# Patient Record
Sex: Male | Born: 1955 | Race: White | Hispanic: No | Marital: Married | Smoking: Never smoker
Health system: Southern US, Community
[De-identification: ages and names within clinical notes are randomized; demographics above are authoritative.]

## PROBLEM LIST (undated history)

## (undated) DIAGNOSIS — K7460 Unspecified cirrhosis of liver: Secondary | ICD-10-CM

## (undated) DIAGNOSIS — R160 Hepatomegaly, not elsewhere classified: Secondary | ICD-10-CM

## (undated) DIAGNOSIS — E871 Hypo-osmolality and hyponatremia: Secondary | ICD-10-CM

---

## 2006-09-14 ENCOUNTER — Ambulatory Visit: Payer: Self-pay | Admitting: Family Medicine

## 2015-09-14 ENCOUNTER — Inpatient Hospital Stay (HOSPITAL_COMMUNITY)
Admission: EM | Admit: 2015-09-14 | Discharge: 2015-09-23 | DRG: 435 | Disposition: A | Discharge status: Home / Self Care | Payer: Medicaid Other | Attending: Family Medicine | Admitting: Family Medicine

## 2015-09-14 ENCOUNTER — Emergency Department (HOSPITAL_COMMUNITY): Payer: Medicaid Other

## 2015-09-14 ENCOUNTER — Encounter (HOSPITAL_COMMUNITY): Payer: Self-pay | Admitting: Emergency Medicine

## 2015-09-14 DIAGNOSIS — C221 Intrahepatic bile duct carcinoma: Secondary | ICD-10-CM | POA: Diagnosis present

## 2015-09-14 DIAGNOSIS — D689 Coagulation defect, unspecified: Secondary | ICD-10-CM | POA: Diagnosis present

## 2015-09-14 DIAGNOSIS — K831 Obstruction of bile duct: Secondary | ICD-10-CM | POA: Diagnosis present

## 2015-09-14 DIAGNOSIS — E871 Hypo-osmolality and hyponatremia: Secondary | ICD-10-CM | POA: Diagnosis present

## 2015-09-14 DIAGNOSIS — J90 Pleural effusion, not elsewhere classified: Secondary | ICD-10-CM | POA: Diagnosis present

## 2015-09-14 DIAGNOSIS — R188 Other ascites: Secondary | ICD-10-CM | POA: Diagnosis present

## 2015-09-14 DIAGNOSIS — R6881 Early satiety: Secondary | ICD-10-CM | POA: Diagnosis present

## 2015-09-14 DIAGNOSIS — N390 Urinary tract infection, site not specified: Secondary | ICD-10-CM | POA: Diagnosis present

## 2015-09-14 DIAGNOSIS — D6959 Other secondary thrombocytopenia: Secondary | ICD-10-CM | POA: Diagnosis present

## 2015-09-14 DIAGNOSIS — I8510 Secondary esophageal varices without bleeding: Secondary | ICD-10-CM | POA: Diagnosis present

## 2015-09-14 DIAGNOSIS — R160 Hepatomegaly, not elsewhere classified: Secondary | ICD-10-CM | POA: Diagnosis present

## 2015-09-14 DIAGNOSIS — K7469 Other cirrhosis of liver: Secondary | ICD-10-CM | POA: Diagnosis present

## 2015-09-14 DIAGNOSIS — C801 Malignant (primary) neoplasm, unspecified: Secondary | ICD-10-CM | POA: Insufficient documentation

## 2015-09-14 DIAGNOSIS — K7460 Unspecified cirrhosis of liver: Secondary | ICD-10-CM | POA: Insufficient documentation

## 2015-09-14 DIAGNOSIS — R140 Abdominal distension (gaseous): Secondary | ICD-10-CM

## 2015-09-14 DIAGNOSIS — R591 Generalized enlarged lymph nodes: Secondary | ICD-10-CM | POA: Diagnosis present

## 2015-09-14 DIAGNOSIS — D72829 Elevated white blood cell count, unspecified: Secondary | ICD-10-CM | POA: Diagnosis present

## 2015-09-14 LAB — URINALYSIS, ROUTINE W REFLEX MICROSCOPIC
Glucose, UA: NEGATIVE mg/dL
Ketones, ur: 15 mg/dL — AB
NITRITE: POSITIVE — AB
PROTEIN: NEGATIVE mg/dL
SPECIFIC GRAVITY, URINE: 1.046 — AB (ref 1.005–1.030)
pH: 5.5 (ref 5.0–8.0)

## 2015-09-14 LAB — LIPASE, BLOOD: LIPASE: 31 U/L (ref 11–51)

## 2015-09-14 LAB — COMPREHENSIVE METABOLIC PANEL
ALK PHOS: 221 U/L — AB (ref 38–126)
ALT: 48 U/L (ref 17–63)
ANION GAP: 6 (ref 5–15)
AST: 103 U/L — ABNORMAL HIGH (ref 15–41)
Albumin: 2.3 g/dL — ABNORMAL LOW (ref 3.5–5.0)
BILIRUBIN TOTAL: 4 mg/dL — AB (ref 0.3–1.2)
BUN: 12 mg/dL (ref 6–20)
CALCIUM: 8.7 mg/dL — AB (ref 8.9–10.3)
CO2: 28 mmol/L (ref 22–32)
CREATININE: 0.69 mg/dL (ref 0.61–1.24)
Chloride: 102 mmol/L (ref 101–111)
Glucose, Bld: 98 mg/dL (ref 65–99)
Potassium: 4.2 mmol/L (ref 3.5–5.1)
SODIUM: 136 mmol/L (ref 135–145)
TOTAL PROTEIN: 7.4 g/dL (ref 6.5–8.1)

## 2015-09-14 LAB — URINE MICROSCOPIC-ADD ON

## 2015-09-14 LAB — CBC
HCT: 38.9 % — ABNORMAL LOW (ref 39.0–52.0)
HEMOGLOBIN: 13.6 g/dL (ref 13.0–17.0)
MCH: 36.3 pg — AB (ref 26.0–34.0)
MCHC: 35 g/dL (ref 30.0–36.0)
MCV: 103.7 fL — ABNORMAL HIGH (ref 78.0–100.0)
PLATELETS: 128 10*3/uL — AB (ref 150–400)
RBC: 3.75 MIL/uL — AB (ref 4.22–5.81)
RDW: 14.4 % (ref 11.5–15.5)
WBC: 6.7 10*3/uL (ref 4.0–10.5)

## 2015-09-14 MED ORDER — SODIUM CHLORIDE 0.9 % IJ SOLN
3.0000 mL | Freq: Two times a day (BID) | INTRAMUSCULAR | Status: DC
  Administered 2015-09-15 – 2015-09-17 (×4): 3 mL via INTRAVENOUS

## 2015-09-14 MED ORDER — SODIUM CHLORIDE 0.9 % IJ SOLN: 3.0000 mL | Freq: Two times a day (BID) | INTRAMUSCULAR | Status: DC

## 2015-09-14 MED ORDER — DEXTROSE 5 % IV SOLN
1.0000 g | Freq: Once | INTRAVENOUS | Status: AC
  Administered 2015-09-14: 1 g via INTRAVENOUS
  Filled 2015-09-14: qty 10

## 2015-09-14 MED ORDER — SODIUM CHLORIDE 0.9 % IJ SOLN
3.0000 mL | INTRAMUSCULAR | Status: DC | PRN
Start: 2015-09-14 — End: 2015-09-17

## 2015-09-14 MED ORDER — ONDANSETRON HCL 4 MG/2ML IJ SOLN
4.0000 mg | Freq: Four times a day (QID) | INTRAMUSCULAR | Status: DC | PRN
Start: 2015-09-14 — End: 2015-09-23

## 2015-09-14 MED ORDER — ONDANSETRON HCL 4 MG PO TABS: 4.0000 mg | ORAL_TABLET | Freq: Four times a day (QID) | ORAL | Status: DC | PRN

## 2015-09-14 MED ORDER — DEXTROSE 5 % IV SOLN
1.0000 g | INTRAVENOUS | Status: DC
  Administered 2015-09-15 – 2015-09-19 (×5): 1 g via INTRAVENOUS
  Filled 2015-09-14 (×5): qty 10

## 2015-09-14 MED ORDER — LORAZEPAM 2 MG/ML IJ SOLN: 1.0000 mg | Freq: Every evening | INTRAMUSCULAR | Status: DC | PRN

## 2015-09-14 MED ORDER — SODIUM CHLORIDE 0.9 % IV SOLN: 250.0000 mL | INTRAVENOUS | Status: DC | PRN

## 2015-09-14 MED ORDER — IOHEXOL 300 MG/ML  SOLN
100.0000 mL | Freq: Once | INTRAMUSCULAR | Status: AC | PRN
  Administered 2015-09-14: 100 mL via INTRAVENOUS

## 2015-09-14 NOTE — ED Notes (Signed)
Attempted to call report. RN unable to take report at this time. Will call back.  

## 2015-09-14 NOTE — ED Provider Notes (Signed)
CSN: [id number]     Arrival date & time 09/14/15  1535 History   First MD Initiated Contact with Patient 09/14/15 1708     Chief Complaint  Patient presents with  . Bloated  . Back Pain     (Consider location/radiation/quality/duration/timing/severity/associated sxs/prior Treatment) HPI Comments: [name] with no significant past medical history presents for bloating and back pain.  The patient reports that he has lost about 35-40 lbs over the last 1 year.  He says that recently he has developed a distended abdomen and has developed leg swelling.  He denies shortness of breath but feels that his abdomen is so large that it is pushing up on his lungs.  He also reports that he has noted associated bilateral flank pain.  He feels like his stomach is full all the time but he is hungry and then cannot eat because his abdomen feels too full.    Patient is a 59 y.o. male presenting with back pain.  Back Pain Associated symptoms: abdominal pain (discomfort without acute pain or focal pain)   Associated symptoms: no chest pain, no dysuria, no fever, no headaches and no weakness     History reviewed. No pertinent past medical history. History reviewed. No pertinent past surgical history. No family history on file. Social History  Substance Use Topics  . Smoking status: Never Smoker   . Smokeless tobacco: None  . Alcohol Use: Yes    Review of Systems  Constitutional: Negative for fever, chills and fatigue.  HENT: Negative for congestion, postnasal drip, rhinorrhea and sinus pressure.   Eyes: Negative for pain.  Respiratory: Negative for cough, chest tightness and shortness of breath.   Cardiovascular: Negative for chest pain and palpitations.  Gastrointestinal: Positive for abdominal pain (discomfort without acute pain or focal pain). Negative for nausea, vomiting, diarrhea and constipation.  Genitourinary: Positive for flank pain (bilateral). Negative for dysuria, urgency and hematuria.   Musculoskeletal: Positive for back pain. Negative for myalgias.  Skin: Negative for rash.  Neurological: Negative for dizziness, weakness and headaches.  Hematological: Does not bruise/bleed easily.      Allergies  Review of patient's allergies indicates no known allergies.  Home Medications   Prior to Admission medications   Medication Sig Start Date End Date Taking? Authorizing Provider  Acetaminophen (TYLENOL PO) Take 2 tablets by mouth every 4 (four) hours as needed (pain).   Yes Historical Provider, MD   BP 142/95 mmHg  Pulse 60  Temp(Src) 98 F (36.7 C) (Oral)  Resp 18  SpO2 97% Physical Exam  Constitutional: He is oriented to person, place, and time. He appears well-developed and well-nourished. No distress.  HENT:  Head: Normocephalic and atraumatic.  Right Ear: External ear normal.  Left Ear: External ear normal.  Mouth/Throat: Oropharynx is clear and moist. No oropharyngeal exudate.  Eyes: EOM are normal. Pupils are equal, round, and reactive to light.  Neck: Normal range of motion. Neck supple.  Cardiovascular: Normal rate, regular rhythm, normal heart sounds and intact distal pulses.   No murmur heard. Pulmonary/Chest: Effort normal. No respiratory distress. He has no wheezes. He has no rales.  Abdominal: Soft. He exhibits distension. There is no tenderness. There is no rebound.  Musculoskeletal: He exhibits edema (1+ bilateral lower extremity edema, symmetric and at the ankles and below).  Neurological: He is alert and oriented to person, place, and time.  Skin: Skin is warm and dry. No rash noted. He is not diaphoretic.  Vitals reviewed.  ED Course  Procedures (including critical care time) Labs Review Labs Reviewed  COMPREHENSIVE METABOLIC PANEL - Abnormal; Notable for the following:    Calcium 8.7 (*)    Albumin 2.3 (*)    AST 103 (*)    Alkaline Phosphatase 221 (*)    Total Bilirubin 4.0 (*)    All other components within normal limits  CBC -  Abnormal; Notable for the following:    RBC 3.75 (*)    HCT 38.9 (*)    MCV 103.7 (*)    MCH 36.3 (*)    Platelets 128 (*)    All other components within normal limits  URINALYSIS, ROUTINE W REFLEX MICROSCOPIC (NOT AT [facility]) - Abnormal; Notable for the following:    Color, Urine ORANGE (*)    Specific Gravity, Urine 1.046 (*)    Hgb urine dipstick SMALL (*)    Bilirubin Urine MODERATE (*)    Ketones, ur 15 (*)    Nitrite POSITIVE (*)    Leukocytes, UA SMALL (*)    All other components within normal limits  URINE MICROSCOPIC-ADD ON - Abnormal; Notable for the following:    Squamous Epithelial / LPF 0-5 (*)    Bacteria, UA FEW (*)    Crystals CA OXALATE CRYSTALS (*)    All other components within normal limits  LIPASE, BLOOD    Imaging Review Dg Chest 2 View  09/14/2015  CLINICAL DATA:  Bilateral ankle swelling. Abdominal distention. Back pain. Shallow breathing. EXAM: CHEST  2 VIEW COMPARISON:  None. FINDINGS: Poor inspiration. Normal sized heart. Minimal left lateral pleural thickening. No pleural fluid is seen posteriorly on either side. Small amount of linear density at the left lung base. Mild thoracic spine degenerative changes. IMPRESSION: Poor inspiration with a small amount of pleural and parenchymal scarring at the left lung base. Electronically Signed   By: Claudie Revering M.D.   On: 09/14/2015 19:20   Ct Abdomen Pelvis W Contrast  09/14/2015  CLINICAL DATA:  Abdominal distention. Back pain. Lower extremity swelling. EXAM: CT ABDOMEN AND PELVIS WITH CONTRAST TECHNIQUE: Multidetector CT imaging of the abdomen and pelvis was performed using the standard protocol following bolus administration of intravenous contrast. CONTRAST:  119mL OMNIPAQUE IOHEXOL 300 MG/ML  SOLN COMPARISON:  None. FINDINGS: Lower chest: No significant pulmonary nodules or acute consolidative airspace disease. Subsegmental atelectasis in the basilar lower lobes. Hepatobiliary: The liver surface is diffusely  prominently irregular, and there is relative hypertrophy of the left liver lobe, in keeping with cirrhosis. There is severe diffuse intrahepatic biliary ductal dilatation. There is a lobulated 4.8 x 3.5 cm mass in the porta hepatis (series 2/ image 25), which demonstrates hypoenhancement relative to the liver parenchyma, and which demonstrates finger-like extensions into the right liver lobe along the biliary tree. 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 Simple appearing 1.9 cm liver cyst in the posterior right liver lobe. Separate simple appearing 3.4 cm liver cyst in the posterior upper right liver lobe. There are at least 2 additional subcentimeter hypodense lesions in the left liver lobe, too small to characterize. Nondistended gallbladder with nonspecific mild diffuse gallbladder wall thickening. The common bile duct is dilated up to 14 mm in diameter proximally with smooth distal tapering. Pancreas: Normal, with no mass or duct dilation. Spleen: Normal size. No mass. Adrenals/Urinary Tract: Normal adrenals. Normal kidneys with no hydronephrosis and no renal mass. Normal bladder. Stomach/Bowel: Grossly normal stomach. Normal caliber small bowel with no small bowel wall thickening.  Normal appendix. Normal large bowel with no diverticulosis, large bowel wall thickening or pericolonic fat stranding. Vascular/Lymphatic: Normal caliber abdominal aorta. Patent portal, splenic and renal veins. There are large esophageal varices. There are small paraumbilical varices. There are additional portosystemic varices in the left omentum, which appear to drain into the left pelvic deep veins. There are multiple mildly enlarged porta hepatis nodes, largest 1.4 cm (series 2/ image 30). Reproductive: Normal size prostate with nonspecific internal prostatic calcifications. Other: No pneumoperitoneum. Moderate to large volume ascites, which measures simple fluid density. Nonspecific fat  stranding throughout the greater omentum and mesentery. Musculoskeletal: No aggressive appearing focal osseous lesions. Subacute healing lateral left eighth rib fracture. Mild-to-moderate degenerative changes in the visualized thoracolumbar spine. IMPRESSION: 1. Cirrhosis. Lobulated 4.8 x 3.5 cm solid mass in the porta hepatis with finger-like extensions into the right liver lobe along the biliary tree. Severe diffuse intrahepatic biliary ductal dilatation. 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 MRI abdomen with and without intravenous contrast could be performed after paracentesis for further evaluation. Recommend GI consultation for ERCP and possible biliary decompression, which may require IR consultation for percutaneous biliary decompression. 2. Large volume ascites. 3. Large esophageal varices. Small paraumbilical varices. Additional portosystemic left omental varices. 4. Nonspecific mild porta hepatis lymphadenopathy. 5. Separate subcentimeter hypodense liver lesions are too small to characterize and could represent liver metastases. 6. Subacute lateral left eighth rib fracture. These results were called by telephone at the time of interpretation on 09/14/2015 at 7:38 pm to Dr. Lonia Skinner , who verbally acknowledged these results. Electronically Signed   By: Ilona Sorrel M.D.   On: 09/14/2015 19:40   I have personally reviewed and evaluated these images and lab results as part of my medical decision-making.   EKG Interpretation None      MDM  Patient was seen and evaluated in stable condition.  CT with concern for cholangiocarcinoma and with biliary dilatation.  Labs showed mildly elevated bilirubin, AST, alk phos.  UA consistent with infection and patient started on Rocephin.  Discussed case with physician on call for [facility] who said they would see the patient in consultation after  admission and evaluated for possible intervention at that time.  Discussed with Dr. Olevia Bowens who agreed with admission and patient was admitted under his care. Final diagnoses:  Ascites  Abdominal distension    1. Biliary ddilatation  2. Cholangiocarcinoma, suspected    Harvel Quale, MD 09/14/15 2046

## 2015-09-14 NOTE — ED Notes (Signed)
MD at bedside. Hospitalist at bedside. 

## 2015-09-14 NOTE — H&P (Signed)
[facility] History and Physical  OMI TAVELLA [id number] DOB: April 23, 1956 DOA: 09/14/2015  Referring physician: Harvel Quale, MD PCP: No primary care provider on file.   Chief Complaint: Bloating.  HPI: CYLAR LAPA is a 59 y.o. male with no significant past medical history who comes to the emergency department with complaints of abdominal distention, abdominal pain, bilateral flank pain, lower extremity edema for about 2 weeks. This was preceded by progressively worse bloating, early satiety for most of this year. He also endorses a 35 pound weight loss during this timeframe as well. He states that he adjusted his eating habits to this, did not think that there was any major health issue on until his abdominal distention and lower extremity edema. He denies chest pain, palpitations, dizziness, diaphoresis, PND, but complains of dyspnea and orthopnea (he feels his "abdomen is pushing on his lungs"). He denies fever, chills, but feels fatigued.   Workup in the ER shows CT scan with the hepatic mass and abnormal LFTs with hyperbilirubinemia.  Review of Systems:  Constitutional:  Positive for weight loss, fatigue.  No night sweats, Fevers, chills.  HEENT:  No headaches, Difficulty swallowing,Tooth/dental problems,Sore throat.  No sneezing, itching, ear ache, nasal congestion, post nasal drip,  Cardio-vascular:  As above mentioned  GI:  Positive abdominal pain, nausea, loss of appetite, early satiety.  No vomiting, diarrhea, change in bowel habits,  Resp:  Positive dyspnea, denies productive cough, hemoptysis or wheezing. Skin:  no rash or lesions.  GU:  no dysuria, change in color of urine, no urgency or frequency. No flank pain.  Musculoskeletal:  Positive back pain and flank pain due to ascites. Psych:  Feels worried and anxious about this liver mass.  No change in mood or affect. No known history depression or anxiety. No memory loss.   History reviewed. No  pertinent past medical history. History reviewed. No pertinent past surgical history. Social History:  reports that he has never smoked. He does not have any smokeless tobacco history on file. He reports that he drinks alcohol. He reports that he does not use illicit drugs.  No Known Allergies  No family history on file.   Prior to Admission medications   Medication Sig Start Date End Date Taking? Authorizing Provider  Acetaminophen (TYLENOL PO) Take 2 tablets by mouth every 4 (four) hours as needed (pain).   Yes Historical Provider, MD   Physical Exam: Filed Vitals:   09/14/15 1606 09/14/15 1831 09/14/15 2118  BP: 148/100 142/95 132/89  Pulse: 109 60 102  Temp: 98 F (36.7 C)    TempSrc: Oral    Resp: 20 18 18   SpO2: 100% 97% 100%    Wt Readings from Last 3 Encounters:  No data found for Wt    General:  Appears calm and comfortable Eyes: PERRL, normal lids, irises & conjunctiva ENT: grossly normal hearing, lips & tongue Neck: no LAD, masses or thyromegaly Cardiovascular: RRR, no m/r/g. 1+ LE edema. Telemetry: SR, no arrhythmias  Respiratory: CTA bilaterally, no w/r/r. Normal respiratory effort. Abdomen: Distended, positive ascites, bowel sounds positive, soft, mild epigastric/right upper quadrant tenderness on palpation. No guarding or rebound tenderness. Skin: Multiple ecchymoses. Musculoskeletal: grossly normal tone BUE/BLE Psychiatric: grossly normal mood and affect, speech fluent and appropriate Neurologic: grossly non-focal.          Labs on Admission:  Basic Metabolic Panel:  Recent Labs Lab 09/14/15 1628  NA 136  K 4.2  CL 102  CO2 28  GLUCOSE 98  BUN 12  CREATININE 0.69  CALCIUM 8.7*   Liver Function Tests:  Recent Labs Lab 09/14/15 1628  AST 103*  ALT 48  ALKPHOS 221*  BILITOT 4.0*  PROT 7.4  ALBUMIN 2.3*    Recent Labs Lab 09/14/15 1830  LIPASE 31   CBC:  Recent Labs Lab 09/14/15 1628  WBC 6.7  HGB 13.6  HCT 38.9*  MCV  103.7*  PLT 128*    Radiological Exams on Admission: Dg Chest 2 View  09/14/2015  CLINICAL DATA:  Bilateral ankle swelling. Abdominal distention. Back pain. Shallow breathing. EXAM: CHEST  2 VIEW COMPARISON:  None. FINDINGS: Poor inspiration. Normal sized heart. Minimal left lateral pleural thickening. No pleural fluid is seen posteriorly on either side. Small amount of linear density at the left lung base. Mild thoracic spine degenerative changes. IMPRESSION: Poor inspiration with a small amount of pleural and parenchymal scarring at the left lung base. Electronically Signed   By: Claudie Revering M.D.   On: 09/14/2015 19:20   Ct Abdomen Pelvis W Contrast  09/14/2015  CLINICAL DATA:  Abdominal distention. Back pain. Lower extremity swelling. EXAM: CT ABDOMEN AND PELVIS WITH CONTRAST TECHNIQUE: Multidetector CT imaging of the abdomen and pelvis was performed using the standard protocol following bolus administration of intravenous contrast. CONTRAST:  176mL OMNIPAQUE IOHEXOL 300 MG/ML  SOLN COMPARISON:  None. FINDINGS: Lower chest: No significant pulmonary nodules or acute consolidative airspace disease. Subsegmental atelectasis in the basilar lower lobes. Hepatobiliary: The liver surface is diffusely prominently irregular, and there is relative hypertrophy of the left liver lobe, in keeping with cirrhosis. There is severe diffuse intrahepatic biliary ductal dilatation. There is a lobulated 4.8 x 3.5 cm mass in the porta hepatis (series 2/ image 25), which demonstrates hypoenhancement relative to the liver parenchyma, and which demonstrates finger-like extensions into the right liver lobe along the biliary tree. 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 Simple appearing 1.9 cm liver cyst in the posterior right liver lobe. Separate simple appearing 3.4 cm liver cyst in the posterior upper right liver lobe. There are at least 2 additional subcentimeter hypodense lesions in the  left liver lobe, too small to characterize. Nondistended gallbladder with nonspecific mild diffuse gallbladder wall thickening. The common bile duct is dilated up to 14 mm in diameter proximally with smooth distal tapering. Pancreas: Normal, with no mass or duct dilation. Spleen: Normal size. No mass. Adrenals/Urinary Tract: Normal adrenals. Normal kidneys with no hydronephrosis and no renal mass. Normal bladder. Stomach/Bowel: Grossly normal stomach. Normal caliber small bowel with no small bowel wall thickening. Normal appendix. Normal large bowel with no diverticulosis, large bowel wall thickening or pericolonic fat stranding. Vascular/Lymphatic: Normal caliber abdominal aorta. Patent portal, splenic and renal veins. There are large esophageal varices. There are small paraumbilical varices. There are additional portosystemic varices in the left omentum, which appear to drain into the left pelvic deep veins. There are multiple mildly enlarged porta hepatis nodes, largest 1.4 cm (series 2/ image 30). Reproductive: Normal size prostate with nonspecific internal prostatic calcifications. Other: No pneumoperitoneum. Moderate to large volume ascites, which measures simple fluid density. Nonspecific fat stranding throughout the greater omentum and mesentery. Musculoskeletal: No aggressive appearing focal osseous lesions. Subacute healing lateral left eighth rib fracture. Mild-to-moderate degenerative changes in the visualized thoracolumbar spine. IMPRESSION: 1. Cirrhosis. Lobulated 4.8 x 3.5 cm solid mass in the porta hepatis with finger-like extensions into the right liver lobe along the biliary tree. Severe diffuse intrahepatic  biliary ductal dilatation. 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 MRI abdomen with and without intravenous contrast could be performed after paracentesis for further evaluation. Recommend  GI consultation for ERCP and possible biliary decompression, which may require IR consultation for percutaneous biliary decompression. 2. Large volume ascites. 3. Large esophageal varices. Small paraumbilical varices. Additional portosystemic left omental varices. 4. Nonspecific mild porta hepatis lymphadenopathy. 5. Separate subcentimeter hypodense liver lesions are too small to characterize and could represent liver metastases. 6. Subacute lateral left eighth rib fracture. These results were called by telephone at the time of interpretation on 09/14/2015 at 7:38 pm to Dr. Lonia Skinner , who verbally acknowledged these results. Electronically Signed   By: Ilona Sorrel M.D.   On: 09/14/2015 19:40      Assessment/Plan Principal Problem:   Liver mass Results were explained in detail to the patient.  Analgesics and antiemetics as needed. Dr. Ardis Hughs from [facility] gastroenterology has been consulted.  Active Problems:   Ascites Please consult IR in the morning for therapeutic and diagnostic ultrasound-guided paracentesis.    UTI (lower urinary tract infection) Continue ceftriaxone IV. Follow-up urine culture and sensitivity.   Dr. Ardis Hughs from [facility] gastroenterology has been consulted.   Code Status: Full code. DVT Prophylaxis: SCDs. Family Communication: Disposition Plan: Admit to telemetry, GI to evaluate in the morning.  Time spent: Over 70 minutes were spent during the process of this admission.  Reubin Milan [facility] Pager [phone].

## 2015-09-14 NOTE — ED Notes (Signed)
RN Chere starting IV to get labs

## 2015-09-14 NOTE — ED Notes (Signed)
Per pt, states abdominal distention causing back pain-states B/L ankle swelling-states last physical was 30 years ago

## 2015-09-15 ENCOUNTER — Inpatient Hospital Stay (HOSPITAL_COMMUNITY): Payer: Medicaid Other

## 2015-09-15 ENCOUNTER — Encounter (HOSPITAL_COMMUNITY): Payer: Self-pay | Admitting: *Deleted

## 2015-09-15 DIAGNOSIS — N390 Urinary tract infection, site not specified: Secondary | ICD-10-CM

## 2015-09-15 DIAGNOSIS — R140 Abdominal distension (gaseous): Secondary | ICD-10-CM

## 2015-09-15 LAB — CBC
HEMATOCRIT: 35.5 % — AB (ref 39.0–52.0)
Hemoglobin: 12.6 g/dL — ABNORMAL LOW (ref 13.0–17.0)
MCH: 36.5 pg — ABNORMAL HIGH (ref 26.0–34.0)
MCHC: 35.5 g/dL (ref 30.0–36.0)
MCV: 102.9 fL — AB (ref 78.0–100.0)
Platelets: 106 10*3/uL — ABNORMAL LOW (ref 150–400)
RBC: 3.45 MIL/uL — AB (ref 4.22–5.81)
RDW: 14.4 % (ref 11.5–15.5)
WBC: 6.1 10*3/uL (ref 4.0–10.5)

## 2015-09-15 LAB — COMPREHENSIVE METABOLIC PANEL
ALT: 48 U/L (ref 17–63)
ANION GAP: 5 (ref 5–15)
AST: 107 U/L — ABNORMAL HIGH (ref 15–41)
Albumin: 2 g/dL — ABNORMAL LOW (ref 3.5–5.0)
Alkaline Phosphatase: 179 U/L — ABNORMAL HIGH (ref 38–126)
BILIRUBIN TOTAL: 5.9 mg/dL — AB (ref 0.3–1.2)
BUN: 13 mg/dL (ref 6–20)
CHLORIDE: 103 mmol/L (ref 101–111)
CO2: 26 mmol/L (ref 22–32)
Calcium: 8.4 mg/dL — ABNORMAL LOW (ref 8.9–10.3)
Creatinine, Ser: 0.52 mg/dL — ABNORMAL LOW (ref 0.61–1.24)
Glucose, Bld: 97 mg/dL (ref 65–99)
POTASSIUM: 4.2 mmol/L (ref 3.5–5.1)
Sodium: 134 mmol/L — ABNORMAL LOW (ref 135–145)
TOTAL PROTEIN: 6.2 g/dL — AB (ref 6.5–8.1)

## 2015-09-15 LAB — APTT: APTT: 38 s — AB (ref 24–37)

## 2015-09-15 LAB — PROTIME-INR
INR: 1.57 — AB (ref 0.00–1.49)
Prothrombin Time: 18.8 seconds — ABNORMAL HIGH (ref 11.6–15.2)

## 2015-09-15 LAB — BODY FLUID CELL COUNT WITH DIFFERENTIAL
EOS FL: 0 %
LYMPHS FL: 50 %
Monocyte-Macrophage-Serous Fluid: 39 % — ABNORMAL LOW (ref 50–90)
NEUTROPHIL FLUID: 11 % (ref 0–25)
Total Nucleated Cell Count, Fluid: 195 cu mm (ref 0–1000)

## 2015-09-15 LAB — FOLATE: FOLATE: 6.8 ng/mL (ref >5.9)

## 2015-09-15 LAB — PROTEIN, BODY FLUID: Total protein, fluid: <3 g/dL

## 2015-09-15 LAB — GLUCOSE, SEROUS FLUID: Glucose, Fluid: 130 mg/dL

## 2015-09-15 MED ORDER — IOHEXOL 300 MG/ML  SOLN
75.0000 mL | Freq: Once | INTRAMUSCULAR | Status: AC | PRN
  Administered 2015-09-15: 75 mL via INTRAVENOUS

## 2015-09-15 MED ORDER — GADOBENATE DIMEGLUMINE 529 MG/ML IV SOLN
20.0000 mL | Freq: Once | INTRAVENOUS | Status: AC | PRN
  Administered 2015-09-15: 20 mL via INTRAVENOUS

## 2015-09-15 NOTE — Procedures (Signed)
Successful US guided paracentesis from LLQ.  Yielded 6 liters of clear yellow fluid.  No immediate complications.  Pt tolerated well.   Specimen was sent for labs.  Quanna Wittke S Jacquilyn Seldon PA-C 09/15/2015 11:37 AM

## 2015-09-15 NOTE — Progress Notes (Signed)
[facility]                                                                              Patient Demographics  Eric Schroeder, is a 59 y.o. male, DOB - 06/24/1956, [id number]  Admit date - 09/14/2015   Admitting Physician Reubin Milan, MD  Outpatient Primary MD for the patient is No primary care provider on file.  LOS - 1   Chief Complaint  Patient presents with  . Bloated  . Back Pain      HPI on 09/14/2015 by Dr. Gerri Lins Eric Schroeder is a 59 y.o. male with no significant past medical history who comes to the emergency department with complaints of abdominal distention, abdominal pain, bilateral flank pain, lower extremity edema for about 2 weeks. This was preceded by progressively worse bloating, early satiety for most of this year. He also endorses a 35 pound weight loss during this timeframe as well. He states that he adjusted his eating habits to this, did not think that there was any major health issue on until his abdominal distention and lower extremity edema. He denies chest pain, palpitations, dizziness, diaphoresis, PND, but complains of dyspnea and orthopnea (he feels his "abdomen is pushing on his lungs"). He denies fever, chills, but feels fatigued.  Assessment & Plan   Abdominal bloating secondary to liver mass/cirrhosis/ascites -CT abdomen shows cirrhosis, lobulated 4.8 x 3.5 solid mass in the porta hepatis, large volume ascites, large esophageal varices -Gastroenterology consultated appreciated -Interventional radiology consulted and appreciated for paracentesis -Patient plan for MRCP on 09/16/2015  -Will obtain CT of the chest  -AFP, mitochondrial antibodies, hepatitis panel pending  -Oncology consultation appreciated   Elevated bilirubin, AST -Likely secondary to the above -Continue to monitor CMP  Urinary tract infection -UA showed 0-5 WBC, positive nitrites leukocytes, few bacteria -Patient was started on ceftriaxone -Urine  culture pending  Code Status: Full  Family Communication: None at bedside  Disposition Plan: Admitted.   Time Spent in minutes   30 minutes  Procedures  US guided paracentesis  Consults   Gastroenterology Interventional radiology Oncology   DVT Prophylaxis  SCDs  Lab Results  Component Value Date   PLT 106* 09/15/2015    Medications  Scheduled Meds: . cefTRIAXone (ROCEPHIN)  IV  1 g Intravenous Q24H  . sodium chloride  3 mL Intravenous Q12H  . sodium chloride  3 mL Intravenous Q12H   Continuous Infusions:  PRN Meds:.sodium chloride, LORazepam, ondansetron **OR** ondansetron (ZOFRAN) IV, sodium chloride  Antibiotics    Anti-infectives    Start     Dose/Rate Route Frequency Ordered Stop   09/15/15 2000  cefTRIAXone (ROCEPHIN) 1 g in dextrose 5 % 50 mL IVPB     1 g 100 mL/hr over 30 Minutes Intravenous Every 24 hours 09/14/15 2230     09/14/15 2045  cefTRIAXone (ROCEPHIN) 1 g in dextrose 5 % 50 mL IVPB     1 g 100 mL/hr over 30 Minutes Intravenous  Once 09/14/15 2031 09/14/15 2111      Subjective:   Eric Schroeder seen and examined today.  Patient states that since he stopped eating, his abdominal bloating has improved.  He denies any problems with urination or bowel changes. Denies chest pain or shortness of breath at this time.   Objective:   Filed Vitals:   09/15/15 1048 09/15/15 1051 09/15/15 1100 09/15/15 1113  BP: 122/84 129/82 125/81 141/91  Pulse:      Temp:      TempSrc:      Resp:      Height:      Weight:      SpO2:        Wt Readings from Last 3 Encounters:  09/14/15 97.6 kg (215 lb 2.7 oz)     Intake/Output Summary (Last 24 hours) at 09/15/15 1126 Last data filed at 09/15/15 0459  Gross per 24 hour  Intake    644 ml  Output      0 ml  Net    644 ml    Exam  General: Well developed, well nourished, NAD, appears stated age  HEENT: NCAT, mild Icteic Sclera, mucous membranes moist.   Cardiovascular: S1 S2 auscultated, no rubs,  murmurs or gallops. Regular rate and rhythm.  Respiratory: Clear to auscultation bilaterally with equal chest rise  Abdomen: Soft, nontender, distended, + bowel sounds  Extremities: warm dry without cyanosis clubbing or edema  Neuro: AAOx3, nonfocal  Skin: Without rashes exudates or nodules, slightly jaundice   Psych: Normal affect and demeanor  Data Review   Micro Results No results found for this or any previous visit (from the past 240 hour(s)).  Radiology Reports Dg Chest 2 View  09/14/2015  CLINICAL DATA:  Bilateral ankle swelling. Abdominal distention. Back pain. Shallow breathing. EXAM: CHEST  2 VIEW COMPARISON:  None. FINDINGS: Poor inspiration. Normal sized heart. Minimal left lateral pleural thickening. No pleural fluid is seen posteriorly on either side. Small amount of linear density at the left lung base. Mild thoracic spine degenerative changes. IMPRESSION: Poor inspiration with a small amount of pleural and parenchymal scarring at the left lung base. Electronically Signed   By: Claudie Revering M.D.   On: 09/14/2015 19:20   Ct Abdomen Pelvis W Contrast  09/14/2015  CLINICAL DATA:  Abdominal distention. Back pain. Lower extremity swelling. EXAM: CT ABDOMEN AND PELVIS WITH CONTRAST TECHNIQUE: Multidetector CT imaging of the abdomen and pelvis was performed using the standard protocol following bolus administration of intravenous contrast. CONTRAST:  142mL OMNIPAQUE IOHEXOL 300 MG/ML  SOLN COMPARISON:  None. FINDINGS: Lower chest: No significant pulmonary nodules or acute consolidative airspace disease. Subsegmental atelectasis in the basilar lower lobes. Hepatobiliary: The liver surface is diffusely prominently irregular, and there is relative hypertrophy of the left liver lobe, in keeping with cirrhosis. There is severe diffuse intrahepatic biliary ductal dilatation. There is a lobulated 4.8 x 3.5 cm mass in the porta hepatis (series 2/ image 25), which demonstrates hypoenhancement  relative to the liver parenchyma, and which demonstrates finger-like extensions into the right liver lobe along the biliary tree. 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 Simple appearing 1.9 cm liver cyst in the posterior right liver lobe. Separate simple appearing 3.4 cm liver cyst in the posterior upper right liver lobe. There are at least 2 additional subcentimeter hypodense lesions in the left liver lobe, too small to characterize. Nondistended gallbladder with nonspecific mild diffuse gallbladder wall thickening. The common bile duct is dilated up to 14 mm in diameter proximally with smooth distal tapering. Pancreas: Normal, with no mass or duct dilation. Spleen: Normal size. No mass. Adrenals/Urinary Tract: Normal adrenals. Normal kidneys with no  hydronephrosis and no renal mass. Normal bladder. Stomach/Bowel: Grossly normal stomach. Normal caliber small bowel with no small bowel wall thickening. Normal appendix. Normal large bowel with no diverticulosis, large bowel wall thickening or pericolonic fat stranding. Vascular/Lymphatic: Normal caliber abdominal aorta. Patent portal, splenic and renal veins. There are large esophageal varices. There are small paraumbilical varices. There are additional portosystemic varices in the left omentum, which appear to drain into the left pelvic deep veins. There are multiple mildly enlarged porta hepatis nodes, largest 1.4 cm (series 2/ image 30). Reproductive: Normal size prostate with nonspecific internal prostatic calcifications. Other: No pneumoperitoneum. Moderate to large volume ascites, which measures simple fluid density. Nonspecific fat stranding throughout the greater omentum and mesentery. Musculoskeletal: No aggressive appearing focal osseous lesions. Subacute healing lateral left eighth rib fracture. Mild-to-moderate degenerative changes in the visualized thoracolumbar spine. IMPRESSION: 1. Cirrhosis. Lobulated 4.8 x 3.5 cm  solid mass in the porta hepatis with finger-like extensions into the right liver lobe along the biliary tree. Severe diffuse intrahepatic biliary ductal dilatation. 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 MRI abdomen with and without intravenous contrast could be performed after paracentesis for further evaluation. Recommend GI consultation for ERCP and possible biliary decompression, which may require IR consultation for percutaneous biliary decompression. 2. Large volume ascites. 3. Large esophageal varices. Small paraumbilical varices. Additional portosystemic left omental varices. 4. Nonspecific mild porta hepatis lymphadenopathy. 5. Separate subcentimeter hypodense liver lesions are too small to characterize and could represent liver metastases. 6. Subacute lateral left eighth rib fracture. These results were called by telephone at the time of interpretation on 09/14/2015 at 7:38 pm to Dr. Lonia Skinner , who verbally acknowledged these results. Electronically Signed   By: Ilona Sorrel M.D.   On: 09/14/2015 19:40    CBC  Recent Labs Lab 09/14/15 1628 09/15/15 0411  WBC 6.7 6.1  HGB 13.6 12.6*  HCT 38.9* 35.5*  PLT 128* 106*  MCV 103.7* 102.9*  MCH 36.3* 36.5*  MCHC 35.0 35.5  RDW 14.4 14.4    Chemistries   Recent Labs Lab 09/14/15 1628 09/15/15 0411  NA 136 134*  K 4.2 4.2  CL 102 103  CO2 28 26  GLUCOSE 98 97  BUN 12 13  CREATININE 0.69 0.52*  CALCIUM 8.7* 8.4*  AST 103* 107*  ALT 48 48  ALKPHOS 221* 179*  BILITOT 4.0* 5.9*   ------------------------------------------------------------------------------------------------------------------ estimated creatinine clearance is 117 mL/min (by C-G formula based on Cr of 0.52). ------------------------------------------------------------------------------------------------------------------ No results for input(s): HGBA1C in  the last 72 hours. ------------------------------------------------------------------------------------------------------------------ No results for input(s): CHOL, HDL, LDLCALC, TRIG, CHOLHDL, LDLDIRECT in the last 72 hours. ------------------------------------------------------------------------------------------------------------------ No results for input(s): TSH, T4TOTAL, T3FREE, THYROIDAB in the last 72 hours.  Invalid input(s): FREET3 ------------------------------------------------------------------------------------------------------------------ No results for input(s): VITAMINB12, FOLATE, FERRITIN, TIBC, IRON, RETICCTPCT in the last 72 hours.  Coagulation profile No results for input(s): INR, PROTIME in the last 168 hours.  No results for input(s): DDIMER in the last 72 hours.  Cardiac Enzymes No results for input(s): CKMB, TROPONINI, MYOGLOBIN in the last 168 hours.  Invalid input(s): CK ------------------------------------------------------------------------------------------------------------------ Invalid input(s): POCBNP    Eric Schroeder D.O. on 09/15/2015 at 11:26 AM  Between 7am to 7pm - Pager - [phone]  After 7pm go to [email or web address] - password [email or web address]  And look for the night coverage person covering for me after hours  [facility] Office  [phone]

## 2015-09-15 NOTE — Consult Note (Signed)
Referring Provider: [facility] Primary Care Physician:  No primary care provider on file. Primary Gastroenterologist:  None, unassigned  Reason for Consultation:  Cirrhosis; biliary ductal dilation secondary to tumor  HPI: Eric Schroeder is a 59 y.o. male with no significant past medical history who comes to the emergency department with complaints of abdominal distention, abdominal pain/discomfort, bilateral flank pain, and lower extremity edema for about 2 weeks. This was preceded by progressively worsening bloating, early satiety for most of this year. He also endorses a 35 pound weight loss during this timeframe as well. He states that he adjusted his eating habits to this, did not think that there was any major health issue until his abdominal distention and lower extremity edema.  LFT's are elevated with total bili of 5.9, AST 107, and ALP 179 today.  CT scan shows the following:  IMPRESSION: 1. Cirrhosis. Lobulated 4.8 x 3.5 cm solid mass in the porta hepatis with finger-like extensions into the right liver lobe along the biliary tree. Severe diffuse intrahepatic biliary ductal dilatation. 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 MRI abdomen with and without intravenous contrast could be performed after paracentesis for further evaluation. Recommend GI consultation for ERCP and possible biliary decompression, which may require IR consultation for percutaneous biliary decompression. 2. Large volume ascites. 3. Large esophageal varices. Small paraumbilical varices. Additional portosystemic left omental varices. 4. Nonspecific mild porta hepatis lymphadenopathy. 5. Separate subcentimeter hypodense liver lesions are too small to characterize and could represent liver metastases. 6. Subacute lateral left eighth rib fracture.  He denies any extensive ETOH use.  Has not seen a physician in 30  years or more.   Prior to Admission medications   Medication Sig Start Date End Date Taking? Authorizing Provider  Acetaminophen (TYLENOL PO) Take 2 tablets by mouth every 4 (four) hours as needed (pain).   Yes Historical Provider, MD    Current Facility-Administered Medications  Medication Dose Route Frequency Provider Last Rate Last Dose  . 0.9 %  sodium chloride infusion  250 mL Intravenous PRN Reubin Milan, MD      . cefTRIAXone (ROCEPHIN) 1 g in dextrose 5 % 50 mL IVPB  1 g Intravenous Q24H Reubin Milan, MD      . LORazepam (ATIVAN) injection 1 mg  1 mg Intravenous QHS PRN,MR X 1 Reubin Milan, MD      . ondansetron [facility] tablet 4 mg  4 mg Oral Q6H PRN Reubin Milan, MD       Or  . ondansetron [facility]) injection 4 mg  4 mg Intravenous Q6H PRN Reubin Milan, MD      . sodium chloride 0.9 % injection 3 mL  3 mL Intravenous Q12H Reubin Milan, MD      . sodium chloride 0.9 % injection 3 mL  3 mL Intravenous Q12H Reubin Milan, MD      . sodium chloride 0.9 % injection 3 mL  3 mL Intravenous PRN Reubin Milan, MD        Allergies as of 09/14/2015  . (No Known Allergies)    No family history on file.  Social History   Social History  . Marital Status: Married    Spouse Name: N/A  . Number of Children: N/A  . Years of Education: N/A   Occupational History  . Not on file.   Social History Main Topics  . Smoking status:  Never Smoker   . Smokeless tobacco: Not on file  . Alcohol Use: Yes  . Drug Use: No  . Sexual Activity: Not on file   Other Topics Concern  . Not on file   Social History Narrative  . No narrative on file    Review of Systems: Ten point ROS is O/W negative except as mentioned in HPI.  Physical Exam: Vital signs in last 24 hours: Temp:  [97.3 F (36.3 C)-98 F (36.7 C)] 97.5 F (36.4 C) (12/04 0459) Pulse Rate:  [60-109] 85 (12/04 0459) Resp:  [16-20] 16 (12/04 0459) BP: (129-149)/(80-100) 129/80  mmHg (12/04 0459) SpO2:  [97 %-100 %] 100 % (12/04 0459) Weight:  [215 lb 2.7 oz (97.6 kg)] 215 lb 2.7 oz (97.6 kg) (12/03 2230) Last BM Date: 09/14/15 General:  Alert, Well-developed, well-nourished, pleasant and cooperative in NAD Head:  Normocephalic and atraumatic. Eyes:  Mild scleral icterus. Ears:  Normal auditory acuity. Mouth:  No deformity or lesions.   Lungs:  Clear throughout to auscultation.  No wheezes, crackles, or rhonchi.  Heart:  Regular rate and rhythm; no murmurs. Abdomen:  Soft, distended due to ascites.   Rectal:  Deferred  Msk:  Symmetrical without gross deformities. Pulses:  Normal pulses noted. Extremities:  Without clubbing or edema. Neurologic:  Alert and  oriented x4;  grossly normal neurologically. Skin:  Intact without significant lesions or rashes. Psych:  Alert and cooperative. Normal mood and affect.  Intake/Output from previous day: 12/03 0701 - 12/04 0700 In: 644 [P.O.:644] Out: -   Lab Results:  Recent Labs  09/14/15 1628 09/15/15 0411  WBC 6.7 6.1  HGB 13.6 12.6*  HCT 38.9* 35.5*  PLT 128* 106*   BMET  Recent Labs  09/14/15 1628 09/15/15 0411  NA 136 134*  K 4.2 4.2  CL 102 103  CO2 28 26  GLUCOSE 98 97  BUN 12 13  CREATININE 0.69 0.52*  CALCIUM 8.7* 8.4*   LFT  Recent Labs  09/15/15 0411  PROT 6.2*  ALBUMIN 2.0*  AST 107*  ALT 48  ALKPHOS 179*  BILITOT 5.9*   Studies/Results: Dg Chest 2 View  09/14/2015  CLINICAL DATA:  Bilateral ankle swelling. Abdominal distention. Back pain. Shallow breathing. EXAM: CHEST  2 VIEW COMPARISON:  None. FINDINGS: Poor inspiration. Normal sized heart. Minimal left lateral pleural thickening. No pleural fluid is seen posteriorly on either side. Small amount of linear density at the left lung base. Mild thoracic spine degenerative changes. IMPRESSION: Poor inspiration with a small amount of pleural and parenchymal scarring at the left lung base. Electronically Signed   By: Claudie Revering  M.D.   On: 09/14/2015 19:20   Ct Abdomen Pelvis W Contrast  09/14/2015  CLINICAL DATA:  Abdominal distention. Back pain. Lower extremity swelling. EXAM: CT ABDOMEN AND PELVIS WITH CONTRAST TECHNIQUE: Multidetector CT imaging of the abdomen and pelvis was performed using the standard protocol following bolus administration of intravenous contrast. CONTRAST:  181mL OMNIPAQUE IOHEXOL 300 MG/ML  SOLN COMPARISON:  None. FINDINGS: Lower chest: No significant pulmonary nodules or acute consolidative airspace disease. Subsegmental atelectasis in the basilar lower lobes. Hepatobiliary: The liver surface is diffusely prominently irregular, and there is relative hypertrophy of the left liver lobe, in keeping with cirrhosis. There is severe diffuse intrahepatic biliary ductal dilatation. There is a lobulated 4.8 x 3.5 cm mass in the porta hepatis (series 2/ image 25), which demonstrates hypoenhancement relative to the liver parenchyma, and which demonstrates finger-like extensions into  the right liver lobe along the biliary tree. 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 Simple appearing 1.9 cm liver cyst in the posterior right liver lobe. Separate simple appearing 3.4 cm liver cyst in the posterior upper right liver lobe. There are at least 2 additional subcentimeter hypodense lesions in the left liver lobe, too small to characterize. Nondistended gallbladder with nonspecific mild diffuse gallbladder wall thickening. The common bile duct is dilated up to 14 mm in diameter proximally with smooth distal tapering. Pancreas: Normal, with no mass or duct dilation. Spleen: Normal size. No mass. Adrenals/Urinary Tract: Normal adrenals. Normal kidneys with no hydronephrosis and no renal mass. Normal bladder. Stomach/Bowel: Grossly normal stomach. Normal caliber small bowel with no small bowel wall thickening. Normal appendix. Normal large bowel with no diverticulosis, large bowel wall thickening  or pericolonic fat stranding. Vascular/Lymphatic: Normal caliber abdominal aorta. Patent portal, splenic and renal veins. There are large esophageal varices. There are small paraumbilical varices. There are additional portosystemic varices in the left omentum, which appear to drain into the left pelvic deep veins. There are multiple mildly enlarged porta hepatis nodes, largest 1.4 cm (series 2/ image 30). Reproductive: Normal size prostate with nonspecific internal prostatic calcifications. Other: No pneumoperitoneum. Moderate to large volume ascites, which measures simple fluid density. Nonspecific fat stranding throughout the greater omentum and mesentery. Musculoskeletal: No aggressive appearing focal osseous lesions. Subacute healing lateral left eighth rib fracture. Mild-to-moderate degenerative changes in the visualized thoracolumbar spine. IMPRESSION: 1. Cirrhosis. Lobulated 4.8 x 3.5 cm solid mass in the porta hepatis with finger-like extensions into the right liver lobe along the biliary tree. Severe diffuse intrahepatic biliary ductal dilatation. 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 MRI abdomen with and without intravenous contrast could be performed after paracentesis for further evaluation. Recommend GI consultation for ERCP and possible biliary decompression, which may require IR consultation for percutaneous biliary decompression. 2. Large volume ascites. 3. Large esophageal varices. Small paraumbilical varices. Additional portosystemic left omental varices. 4. Nonspecific mild porta hepatis lymphadenopathy. 5. Separate subcentimeter hypodense liver lesions are too small to characterize and could represent liver metastases. 6. Subacute lateral left eighth rib fracture. These results were called by telephone at the time of interpretation on 09/14/2015 at 7:38 pm to Dr. Lonia Skinner , who verbally  acknowledged these results. Electronically Signed   By: Ilona Sorrel M.D.   On: 09/14/2015 19:40    IMPRESSION:  -59 year old male with abdominal pain, abdominal distention, LE edema, weight loss now with CT scan showing cirrhosis with large volume ascites, large esophageal varices, and a large mass in the porta hepatis (? HCC vs Klatskin tumor).  PLAN: -Plan large volume paracentesis with cell count, cytology, culture, and albumin.  Spoke with Dr. Kathlene Cote who will have this performed. -MRCP today post paracentesis to further delineate tumor/biliary tract prior to decompression with ERCP or other. -Will check viral hepatitis studies, ANA, AMA, ASMA,and iron studies to help evaluate cause of cirrhosis since patient denies extensive ETOH use. -Check coags and folate/vitamin B12 levels.  ZEHR, JESSICA D.  09/15/2015, 8:03 AM  Pager number [phone]   ________________________________________________________________________  Velora Heckler GI MD note:  I personally examined the patient, reviewed the data and agree with the assessment and plan described above.  Etiology of cirrhosis unclear, he denies current heavy Etoh use but did drink more remotely.  Given MCV 103 I'm suspicious his current intake is heavy  than he admits.  Will test for usual causes (viral, autoimmune, etc).  Clearly has decompensated cirrhosis with large volume ascites, varices on imaging. He is currently getting LVP (6 liters total).  Cytology to be sent from this.  IF + then Stage IV cancer.  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  MRI ordered today. This may help prove [place] vs. [place].  MRI with MRCP also to give best anatomic evaluation of biliary tracts, plan for biliary decompression (via ERCP or PTC).  Will discuss with partners in AM about best way to decompress.   Owens Loffler, MD [facility] Pager [phone]

## 2015-09-15 NOTE — Progress Notes (Signed)
Utilization review completed.  

## 2015-09-16 ENCOUNTER — Inpatient Hospital Stay (HOSPITAL_COMMUNITY): Payer: Medicaid Other

## 2015-09-16 ENCOUNTER — Encounter (HOSPITAL_COMMUNITY): Payer: Self-pay | Admitting: Radiology

## 2015-09-16 DIAGNOSIS — R740 Nonspecific elevation of levels of transaminase and lactic acid dehydrogenase [LDH]: Secondary | ICD-10-CM

## 2015-09-16 DIAGNOSIS — K831 Obstruction of bile duct: Secondary | ICD-10-CM

## 2015-09-16 DIAGNOSIS — C801 Malignant (primary) neoplasm, unspecified: Secondary | ICD-10-CM | POA: Insufficient documentation

## 2015-09-16 LAB — COMPREHENSIVE METABOLIC PANEL
ALBUMIN: 1.8 g/dL — AB (ref 3.5–5.0)
ALK PHOS: 177 U/L — AB (ref 38–126)
ALT: 50 U/L (ref 17–63)
AST: 109 U/L — AB (ref 15–41)
Anion gap: 5 (ref 5–15)
BUN: 12 mg/dL (ref 6–20)
CALCIUM: 8.2 mg/dL — AB (ref 8.9–10.3)
CHLORIDE: 101 mmol/L (ref 101–111)
CO2: 27 mmol/L (ref 22–32)
CREATININE: 0.62 mg/dL (ref 0.61–1.24)
GFR calc Af Amer: >60 mL/min (ref >60)
GFR calc non Af Amer: >60 mL/min (ref >60)
GLUCOSE: 108 mg/dL — AB (ref 65–99)
Potassium: 4.2 mmol/L (ref 3.5–5.1)
SODIUM: 133 mmol/L — AB (ref 135–145)
Total Bilirubin: 3.6 mg/dL — ABNORMAL HIGH (ref 0.3–1.2)
Total Protein: 6.1 g/dL — ABNORMAL LOW (ref 6.5–8.1)

## 2015-09-16 LAB — AFP TUMOR MARKER: AFP-Tumor Marker: 2 ng/mL (ref 0.0–8.3)

## 2015-09-16 LAB — CBC
HCT: 36.5 % — ABNORMAL LOW (ref 39.0–52.0)
HEMOGLOBIN: 12.8 g/dL — AB (ref 13.0–17.0)
MCH: 36 pg — ABNORMAL HIGH (ref 26.0–34.0)
MCHC: 35.1 g/dL (ref 30.0–36.0)
MCV: 102.5 fL — ABNORMAL HIGH (ref 78.0–100.0)
PLATELETS: 106 10*3/uL — AB (ref 150–400)
RBC: 3.56 MIL/uL — AB (ref 4.22–5.81)
RDW: 14.3 % (ref 11.5–15.5)
WBC: 5.5 10*3/uL (ref 4.0–10.5)

## 2015-09-16 LAB — FERRITIN: Ferritin: 1935 ng/mL — ABNORMAL HIGH (ref 24–336)

## 2015-09-16 LAB — IRON AND TIBC: IRON: 76 ug/dL (ref 45–182)

## 2015-09-16 LAB — HEPATITIS C ANTIBODY

## 2015-09-16 LAB — HEPATITIS A ANTIBODY, TOTAL: Hep A Total Ab: POSITIVE — AB

## 2015-09-16 LAB — ABO/RH: ABO/RH(D): B POS

## 2015-09-16 LAB — CEA: CEA: 3.2 ng/mL (ref 0.0–4.7)

## 2015-09-16 LAB — HEPATITIS B SURFACE ANTIBODY,QUALITATIVE: HEP B S AB: NONREACTIVE

## 2015-09-16 LAB — HEPATITIS B SURFACE ANTIGEN: Hepatitis B Surface Ag: NEGATIVE

## 2015-09-16 LAB — VITAMIN B12: VITAMIN B 12: 756 pg/mL (ref 180–914)

## 2015-09-16 MED ORDER — IOHEXOL 300 MG/ML  SOLN
15.0000 mL | Freq: Once | INTRAMUSCULAR | Status: AC | PRN
  Administered 2015-09-16: 15 mL

## 2015-09-16 MED ORDER — FENTANYL CITRATE (PF) 100 MCG/2ML IJ SOLN
INTRAMUSCULAR | Status: AC
  Filled 2015-09-16: qty 4

## 2015-09-16 MED ORDER — LIDOCAINE HCL 1 % IJ SOLN
INTRAMUSCULAR | Status: AC
  Filled 2015-09-16: qty 20

## 2015-09-16 MED ORDER — MIDAZOLAM HCL 2 MG/2ML IJ SOLN
INTRAMUSCULAR | Status: AC
  Filled 2015-09-16: qty 6

## 2015-09-16 MED ORDER — SODIUM CHLORIDE 0.9 % IV SOLN
Freq: Once | INTRAVENOUS | Status: AC
  Administered 2015-09-16: 13:00:00 via INTRAVENOUS

## 2015-09-16 MED ORDER — FENTANYL CITRATE (PF) 100 MCG/2ML IJ SOLN
INTRAMUSCULAR | Status: AC | PRN
  Administered 2015-09-16: 25 ug via INTRAVENOUS
  Administered 2015-09-16: 50 ug via INTRAVENOUS
  Administered 2015-09-16: 25 ug via INTRAVENOUS

## 2015-09-16 MED ORDER — MIDAZOLAM HCL 2 MG/2ML IJ SOLN
INTRAMUSCULAR | Status: AC | PRN
  Administered 2015-09-16: 0.5 mg via INTRAVENOUS
  Administered 2015-09-16: 1 mg via INTRAVENOUS
  Administered 2015-09-16 (×5): 0.5 mg via INTRAVENOUS

## 2015-09-16 NOTE — Progress Notes (Signed)
Patient ID: Eric Schroeder, male   DOB: 02-01-1956, 59 y.o.   MRN: [id number]    Progress Note   Subjective    Up in chair, feels much better since paracentesis. No c/o abdominal pain. Pt reports all of his sxs started within the past 2 weeks.    Objective   Vital signs in last 24 hours: Temp:  [97.3 F (36.3 C)-98.7 F (37.1 C)] 97.3 F (36.3 C) (12/05 0528) Pulse Rate:  [84-86] 85 (12/05 0528) Resp:  [16-18] 16 (12/05 0528) BP: (119-141)/(72-91) 119/72 mmHg (12/05 0528) SpO2:  [100 %] 100 % (12/05 0528) Last BM Date: 09/15/15 General:    white male  in NAD, icteric Heart:  Regular rate and rhythm; no murmurs Lungs: Respirations even and unlabored, lungs CTA bilaterally Abdomen:  Soft, nontender and nondistended. Normal bowel sounds, non tense ascites, no palp mass or HSM. Extremities: trace edema Neurologic:  Alert and oriented,  grossly normal neurologically. Psych:  Cooperative. Normal mood and affect.  Intake/Output from previous day: 12/04 0701 - 12/05 0700 In: 1322 [P.O.:1322] Out: -  Intake/Output this shift:    Lab Results:  Recent Labs  09/14/15 1628 09/15/15 0411 09/16/15 0430  WBC 6.7 6.1 5.5  HGB 13.6 12.6* 12.8*  HCT 38.9* 35.5* 36.5*  PLT 128* 106* 106*   BMET  Recent Labs  09/14/15 1628 09/15/15 0411 09/16/15 0430  NA 136 134* 133*  K 4.2 4.2 4.2  CL 102 103 101  CO2 $Re'28 26 27  'QSr$ GLUCOSE 98 97 108*  BUN $Re'12 13 12  'MDp$ CREATININE 0.69 0.52* 0.62  CALCIUM 8.7* 8.4* 8.2*   LFT  Recent Labs  09/16/15 0430  PROT 6.1*  ALBUMIN 1.8*  AST 109*  ALT 50  ALKPHOS 177*  BILITOT 3.6*   PT/INR  Recent Labs  09/15/15 1208  LABPROT 18.8*  INR 1.57*    Studies/Results: Dg Chest 2 View  09/14/2015  CLINICAL DATA:  Bilateral ankle swelling. Abdominal distention. Back pain. Shallow breathing. EXAM: CHEST  2 VIEW COMPARISON:  None. FINDINGS: Poor inspiration. Normal sized heart. Minimal left lateral pleural thickening. No pleural fluid is seen  posteriorly on either side. Small amount of linear density at the left lung base. Mild thoracic spine degenerative changes. IMPRESSION: Poor inspiration with a small amount of pleural and parenchymal scarring at the left lung base. Electronically Signed   By: Claudie Revering M.D.   On: 09/14/2015 19:20   Ct Chest W Contrast  09/15/2015  CLINICAL DATA:  Liver mass diagnosed on CT 1 day prior. Status post paracentesis. Fatigue. Inpatient. EXAM: CT CHEST WITH CONTRAST TECHNIQUE: Multidetector CT imaging of the chest was performed during intravenous contrast administration. CONTRAST:  53mL OMNIPAQUE IOHEXOL 300 MG/ML  SOLN COMPARISON:  09/14/2015 CT abdomen/ pelvis. Chest radiograph from 1 day prior. FINDINGS: Mediastinum/Nodes: Normal heart size. No pericardial fluid/thickening. Left anterior descending and left circumflex coronary atherosclerosis. Great vessels are normal in course and caliber. No central pulmonary emboli. Normal visualized thyroid. Large lower esophageal varices. No pathologically enlarged axillary, mediastinal or hilar lymph nodes. Lungs/Pleura: No pneumothorax. Trace left pleural effusion. Subsegmental atelectasis in both lower lobes. No acute consolidative airspace disease, significant pulmonary nodules or lung masses. Upper abdomen: Re- demonstrated is cirrhosis and diffuse severe intrahepatic biliary ductal dilatation, with re- demonstration of central liver 4.7 cm mass extending superiorly and inferiorly along the central bile ducts. There are stable subcentimeter hypodense lesions in the left liver lobe and simple cysts in the right liver lobe.  Small volume upper abdominal ascites, decreased. Fat stranding throughout the upper abdominal mesenteric and omental fat, unchanged. Stable dilated visualized proximal common bile duct. Stable mild upper retroperitoneal adenopathy. Musculoskeletal: Stable nondisplaced healing subacute lateral left eighth rib fracture. Mild-to-moderate degenerative  changes in thoracic spine. IMPRESSION: 1. No evidence of metastatic disease in the chest. 2. Trace left pleural effusion. Mild bibasilar atelectasis. No acute consolidative airspace disease. 3. Three-vessel coronary atherosclerosis. 4. Stable nondisplaced healing subacute lateral left eighth rib fracture. 5. Re- demonstration of cirrhosis, central liver mass, severe diffuse intrahepatic biliary ductal dilatation, indeterminate subcentimeter hypodense left liver lobe lesions. Small volume upper abdominal ascites, decreased. Electronically Signed   By: Ilona Sorrel M.D.   On: 09/15/2015 13:47   Ct Abdomen Pelvis W Contrast  09/14/2015  CLINICAL DATA:  Abdominal distention. Back pain. Lower extremity swelling. EXAM: CT ABDOMEN AND PELVIS WITH CONTRAST TECHNIQUE: Multidetector CT imaging of the abdomen and pelvis was performed using the standard protocol following bolus administration of intravenous contrast. CONTRAST:  168mL OMNIPAQUE IOHEXOL 300 MG/ML  SOLN COMPARISON:  None. FINDINGS: Lower chest: No significant pulmonary nodules or acute consolidative airspace disease. Subsegmental atelectasis in the basilar lower lobes. Hepatobiliary: The liver surface is diffusely prominently irregular, and there is relative hypertrophy of the left liver lobe, in keeping with cirrhosis. There is severe diffuse intrahepatic biliary ductal dilatation. There is a lobulated 4.8 x 3.5 cm mass in the porta hepatis (series 2/ image 25), which demonstrates hypoenhancement relative to the liver parenchyma, and which demonstrates finger-like extensions into the right liver lobe along the biliary tree. 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 Simple appearing 1.9 cm liver cyst in the posterior right liver lobe. Separate simple appearing 3.4 cm liver cyst in the posterior upper right liver lobe. There are at least 2 additional subcentimeter hypodense lesions in the left liver lobe, too small to  characterize. Nondistended gallbladder with nonspecific mild diffuse gallbladder wall thickening. The common bile duct is dilated up to 14 mm in diameter proximally with smooth distal tapering. Pancreas: Normal, with no mass or duct dilation. Spleen: Normal size. No mass. Adrenals/Urinary Tract: Normal adrenals. Normal kidneys with no hydronephrosis and no renal mass. Normal bladder. Stomach/Bowel: Grossly normal stomach. Normal caliber small bowel with no small bowel wall thickening. Normal appendix. Normal large bowel with no diverticulosis, large bowel wall thickening or pericolonic fat stranding. Vascular/Lymphatic: Normal caliber abdominal aorta. Patent portal, splenic and renal veins. There are large esophageal varices. There are small paraumbilical varices. There are additional portosystemic varices in the left omentum, which appear to drain into the left pelvic deep veins. There are multiple mildly enlarged porta hepatis nodes, largest 1.4 cm (series 2/ image 30). Reproductive: Normal size prostate with nonspecific internal prostatic calcifications. Other: No pneumoperitoneum. Moderate to large volume ascites, which measures simple fluid density. Nonspecific fat stranding throughout the greater omentum and mesentery. Musculoskeletal: No aggressive appearing focal osseous lesions. Subacute healing lateral left eighth rib fracture. Mild-to-moderate degenerative changes in the visualized thoracolumbar spine. IMPRESSION: 1. Cirrhosis. Lobulated 4.8 x 3.5 cm solid mass in the porta hepatis with finger-like extensions into the right liver lobe along the biliary tree. Severe diffuse intrahepatic biliary ductal dilatation. 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 MRI abdomen with and without intravenous contrast could be performed after paracentesis for further evaluation. Recommend GI consultation for ERCP and  possible biliary decompression, which may require IR consultation for  percutaneous biliary decompression. 2. Large volume ascites. 3. Large esophageal varices. Small paraumbilical varices. Additional portosystemic left omental varices. 4. Nonspecific mild porta hepatis lymphadenopathy. 5. Separate subcentimeter hypodense liver lesions are too small to characterize and could represent liver metastases. 6. Subacute lateral left eighth rib fracture. These results were called by telephone at the time of interpretation on 09/14/2015 at 7:38 pm to Dr. Lonia Skinner , who verbally acknowledged these results. Electronically Signed   By: Ilona Sorrel M.D.   On: 09/14/2015 19:40   Mr 3d Recon At Scanner  09/15/2015  CLINICAL DATA:  Cirrhosis with ascites, distal esophageal varices and lobulated mass in the porta hepatis causing biliary obstruction on CT. EXAM: MRI ABDOMEN WITHOUT AND WITH CONTRAST (INCLUDING MRCP) TECHNIQUE: Multiplanar multisequence MR imaging of the abdomen was performed both before and after the administration of intravenous contrast. Heavily T2-weighted images of the biliary and pancreatic ducts were obtained, and three-dimensional MRCP images were rendered by post processing. CONTRAST:  80mL MULTIHANCE GADOBENATE DIMEGLUMINE 529 MG/ML IV SOLN COMPARISON:  Abdominal CT 09/14/2015. FINDINGS: Unfortunately, the study is mildly motion degraded due to the patient's inability to completely suspend respiration. Lower chest:  Small left-greater-than-right pleural effusions. Hepatobiliary: The liver contours are diffusely irregular consistent with cirrhosis. There are multiple siderotic nodules throughout the liver with diffuse suppression of hepatic signal on the in phase images consistent with hemosiderosis. There is a 1.6 cm lesion in the medial segment of the left hepatic lobe which demonstrates T1 shortening and low level enhancement following contrast. No other enhancing intrahepatic lesions are  identified. There are several hepatic cysts, largest projecting from the dome of the right hepatic lobe, measuring 3.5 cm. As demonstrated on CT, there is a tubular mass within the porta hepatis which appears intra biliary. This demonstrates intermediate T2 signal, low-level enhancement and measures up to 3.8 cm transverse and 6.3 cm in length. There is resulting marked intrahepatic biliary dilatation with the left hepatic duct measuring up to 1.7 cm. The distal common bile duct is normal in caliber. Pancreas: Mildly atrophied. No evidence of pancreatic mass or pancreatic ductal dilatation. Spleen: Normal in size without focal abnormality. Adrenals/Urinary Tract: Both adrenal glands appear normal. Small renal cysts are noted. There is no suspicious renal finding or hydronephrosis. Stomach/Bowel: No evidence of bowel wall thickening, distention or surrounding inflammatory change. Vascular/Lymphatic: Multiple mildly enlarged lymph nodes within the porta hepatis are again noted. 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 Other: Ascites has decreased in volume. No suspicious peritoneal enhancement. Musculoskeletal: No acute or significant osseous findings. IMPRESSION: 1. 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 Tissue sampling and biliary decompression recommended. 2. Prominent nonspecific lymph nodes in the porta hepatis, likely related to chronic liver disease. No definite distant metastases. 3. Macronodular cirrhosis with multiple siderotic nodules. There is one T1 hyperintense nodule in the left lobe which demonstrates low level enhancement and could reflect a dysplastic nodule. 4. Decreased volume of ascites following paracentesis. Electronically Signed   By: Richardean Sale M.D.   On: 09/15/2015 13:52   US Paracentesis  09/15/2015  Ardis Rowan, PA-C     09/15/2015 11:37 AM Successful US guided paracentesis from LLQ. Yielded 6 liters of clear yellow fluid. No immediate complications. Pt tolerated well. Specimen was sent for labs. Murrell Redden PA-C 09/15/2015 11:37 AM   Mr Abd W/wo Cm/mrcp  09/15/2015  CLINICAL DATA:  Cirrhosis with ascites, distal esophageal varices and lobulated mass in the porta hepatis causing biliary obstruction on CT. EXAM: MRI ABDOMEN WITHOUT AND WITH CONTRAST (INCLUDING MRCP) TECHNIQUE: Multiplanar multisequence MR imaging of the abdomen was performed both before and after the administration of intravenous contrast. Heavily T2-weighted images of the biliary and pancreatic ducts were obtained, and three-dimensional MRCP images were rendered by post processing. CONTRAST:  41mL MULTIHANCE GADOBENATE DIMEGLUMINE 529 MG/ML IV SOLN COMPARISON:  Abdominal CT 09/14/2015. FINDINGS: Unfortunately, the study is mildly motion degraded due to the patient's inability to completely suspend respiration. Lower chest:  Small left-greater-than-right pleural effusions. Hepatobiliary: The liver contours are diffusely irregular consistent with cirrhosis. There are multiple siderotic nodules throughout the liver with diffuse suppression of hepatic signal on the in phase images consistent with hemosiderosis. There is a 1.6 cm lesion in the medial segment of the left hepatic lobe which demonstrates T1 shortening and low level enhancement following contrast. No other enhancing intrahepatic lesions are identified. There are several hepatic cysts, largest projecting from the dome of the right hepatic lobe, measuring 3.5 cm. As demonstrated on CT, there is a tubular mass within the porta hepatis which appears intra biliary. This demonstrates intermediate T2 signal, low-level enhancement and measures up to 3.8 cm transverse and 6.3 cm in length. There is resulting marked intrahepatic biliary dilatation with the left hepatic duct measuring up to 1.7 cm.  The distal common bile duct is normal in caliber. Pancreas: Mildly atrophied. No evidence of pancreatic mass or pancreatic ductal dilatation. Spleen: Normal in size without focal abnormality. Adrenals/Urinary Tract: Both adrenal glands appear normal. Small renal cysts are noted. There is no suspicious renal finding or hydronephrosis. Stomach/Bowel: No evidence of bowel wall thickening, distention or surrounding inflammatory change. Vascular/Lymphatic: Multiple mildly enlarged lymph nodes within the porta hepatis are again noted. 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 Other: Ascites has decreased in volume. No suspicious peritoneal enhancement. Musculoskeletal: No acute or significant osseous findings. IMPRESSION: 1. 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 Tissue sampling and biliary decompression recommended. 2. Prominent nonspecific lymph nodes in the porta hepatis, likely related to chronic liver disease. No definite distant metastases. 3. Macronodular cirrhosis with multiple siderotic nodules. There is one T1 hyperintense nodule in the left lobe which demonstrates low level enhancement and could reflect a dysplastic nodule. 4. Decreased volume of ascites following paracentesis. Electronically Signed   By: Richardean Sale M.D.   On: 09/15/2015 13:52       Assessment / Plan:    #1 59 yo male with new onset ascites and new diagnosis of decompensated cirrhosis- Etiology not clear ? ETOH. 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 Dysplastic nodule  Plan: Pt needs biliary decompression and Bx . Discussed in detail with pt. Will ask IR to see today to consider  [facility] with tissue sampling and drainage.  ERCP can be attempted if felt indicated by IR . May need repeat paracentesis prior to percutaneous procedure-will defer to IR Continue Rocephin Cytology from peritoneal fluid pending Tumor markers pending, will add CA19-9  Principal Problem:   Liver mass Active Problems:   Ascites   UTI (lower urinary tract infection)     LOS: 2 days   Amy Esterwood  09/16/2015, 9:42 AM     Attending physician's note   I have taken an interval history, reviewed the chart and examined the patient. I agree with the Advanced Practitioner's note, impression and recommendations.  Ascites improved post 6 liter paracentesis, no evidence of SBP. Pt much more comfortable. Blood work is stable. 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 Prominent nonspecific lymph nodes in the porta hepatis, likely related to chronic liver disease. No definite distant metastases. Macronodular cirrhosis with multiple siderotic nodules and esophageal varices. Decreased volume of ascites following paracentesis.  Awaiting AFP, CEA and hepatic serologies. Send CA19-9. IR consultation. If PTC can accomplish biliary decompression and tissue sampling that might be the best option. Consider ERCP however ERCP might not be able to adequately decompress both left and right intrahepatic systems.     Lucio Edward, MD [facility] [phone] Mon-Fri 8a-5p [phone] after 5p, weekends, holidays

## 2015-09-16 NOTE — Procedures (Signed)
L 8 Fr int/ext bili drain No comp

## 2015-09-16 NOTE — Consult Note (Signed)
[place]  [phone] [phone]  Inpatient New Consult Note   Referring physician: Dr. Ree Kida, Triage Hospitalist  CHIEF COMPLAINTS/PURPOSE OF CONSULTATION:  Liver mass, suspicious for glandular carcinoma  HISTORY OF PRESENTING ILLNESS:  Eric Schroeder 59 y.o. male who was admitted to [facility] on 09/14/2015 for abdominal pain and distention. We are consulted for his liver mass, which is highly suspicious for cholangiocarcinoma.  Patient previously was in good health condition, and have not seen Dr. regularly for the past 10 years. He presented with worsening fatigue, and abdominal discomfort for the past 3 months. He lost a total of about 40 pounds in the past year, initially was intentional. He presented with progressive abdominal bloating, bilateral flank pain, anorexia for 2 weeks, was admitted on 09/13/2015. Further workup reviewed jaundice, and CT chest abdomen and pelvis reviewed a 4.8 x 3.5 cm mass in the porta hepatitis, and a simple cyst 1.9 centimeter in the posterior right lobe of liver, liver cirrhosis, large volume ascites, nonspecific mild porta hepatitis lymphadenopathy. No other evidence of distant metastasis.  He underwent paracentesis, ascites cytology was negative for malignant cells. GI was consulted, Dr. Fuller Plan feels it would be difficult for ERCP and stent is cement. So he was referred to IR and underwent percutaneous biliary drainage yesterday afternoon. He tolerated procedure well, mild pain at the drainage tube site, no other complaints. His abdominal bloating has improved after paracentesis.   MEDICAL HISTORY:  History reviewed. No pertinent past medical history.  SURGICAL HISTORY: History reviewed. No pertinent past surgical history.  SOCIAL HISTORY: Social History   Social History  . Marital Status: Married    Spouse Name: N/A  . Number of Children: N/A  . Years of Education: N/A   Occupational History    . Not on file.   Social History Main Topics  . Smoking status: Never Smoker   . Smokeless tobacco: Not on file  . Alcohol Use: 1.2 - 2.4 oz/week    2-4 Cans of beer per week  . Drug Use: No  . Sexual Activity: Not on file   Other Topics Concern  . Not on file   Social History Narrative    FAMILY HISTORY: History reviewed. No pertinent family history.  ALLERGIES:  has No Known Allergies.  MEDICATIONS:  Current Facility-Administered Medications  Medication Dose Route Frequency Provider Last Rate Last Dose  . 0.9 %  sodium chloride infusion  250 mL Intravenous PRN Maryann Mikhail, DO      . cefTRIAXone (ROCEPHIN) 1 g in dextrose 5 % 50 mL IVPB  1 g Intravenous Q24H Reubin Milan, MD   1 g at 09/16/15 2056  . feeding supplement (ENSURE ENLIVE) (ENSURE ENLIVE) liquid 237 mL  237 mL Oral BID BM Clayton Bibles, RD   237 mL at 09/17/15 1405  . HYDROcodone-acetaminophen (NORCO/VICODIN) 5-325 MG per tablet 1-2 tablet  1-2 tablet Oral Q4H PRN Amy S Esterwood, PA-C   2 tablet at 09/17/15 1405  . LORazepam (ATIVAN) injection 1 mg  1 mg Intravenous QHS PRN,MR X 1 Reubin Milan, MD      . ondansetron [facility]) tablet 4 mg  4 mg Oral Q6H PRN Reubin Milan, MD       Or  . ondansetron [facility]) injection 4 mg  4 mg Intravenous Q6H PRN Reubin Milan, MD      . sodium chloride 0.9 % injection 3 mL  3 mL Intravenous Q12H Maryann Mikhail, DO  0 mL at 09/17/15 1212  . sodium chloride 0.9 % injection 3 mL  3 mL Intravenous PRN Maryann Mikhail, DO        REVIEW OF SYSTEMS:   Constitutional: Denies fevers, chills or abnormal night sweats, (+) weight loss  Eyes: Denies blurriness of vision, double vision or watery eyes Ears, nose, mouth, throat, and face: Denies mucositis or sore throat Respiratory: Denies cough, dyspnea or wheezes Cardiovascular: Denies palpitation, chest discomfort or lower extremity swelling Gastrointestinal:  See HPI  Skin: Denies abnormal skin  rashes Lymphatics: Denies new lymphadenopathy or easy bruising Neurological:Denies numbness, tingling or new weaknesses Behavioral/Psych: Mood is stable, no new changes  All other systems were reviewed with the patient and are negative.  PHYSICAL EXAMINATION: ECOG PERFORMANCE STATUS: 2 - Symptomatic, <50% confined to bed  Filed Vitals:   09/16/15 2020 09/17/15 0547  BP: 126/72 132/76  Pulse: 84 88  Temp: 97.6 F (36.4 C) 98.6 F (37 C)  Resp: 20 18   Filed Weights   09/14/15 2230 09/17/15 1404  Weight: 215 lb 2.7 oz (97.6 kg) 186 lb 4.6 oz (84.5 kg)    GENERAL:alert, no distress and comfortable SKIN: skin color, texture, turgor are normal, no rashes or significant lesions EYES: normal, conjunctiva are pink and non-injected, sclera clear OROPHARYNX:no exudate, no erythema and lips, buccal mucosa, and tongue normal  NECK: supple, thyroid normal size, non-tender, without nodularity LYMPH:  no palpable lymphadenopathy in the cervical, axillary or inguinal LUNGS: clear to auscultation and percussion with normal breathing effort HEART: regular rate & rhythm and no murmurs and no lower extremity edema ABDOMEN:abdomen soft, non-tender and normal bowel sounds Musculoskeletal:no cyanosis of digits and no clubbing  PSYCH: alert & oriented x 3 with fluent speech NEURO: no focal motor/sensory deficits  LABORATORY DATA:  I have reviewed the data as listed Lab Results  Component Value Date   WBC 7.0 09/17/2015   HGB 13.4 09/17/2015   HCT 37.6* 09/17/2015   MCV 103.3* 09/17/2015   PLT 119* 09/17/2015    Recent Labs  09/15/15 0411 09/16/15 0430 09/17/15 0418  NA 134* 133* 132*  K 4.2 4.2 4.1  CL 103 101 99*  CO2 26 27 28   GLUCOSE 97 108* 105*  BUN 13 12 16   CREATININE 0.52* 0.62 0.68  CALCIUM 8.4* 8.2* 9.0  GFRNONAA >60 >60 >60  GFRAA >60 >60 >60  PROT 6.2* 6.1* 6.6  ALBUMIN 2.0* 1.8* 2.1*  AST 107* 109* 91*  ALT 48 50 46  ALKPHOS 179* 177* 170*  BILITOT 5.9* 3.6*  3.6*   PATHOLOGY REPORT Diagnosis 09/15/2015  PERITONEAL/ASCITIC FLUID(SPECIMEN 1 OF 1 COLLECTED 09/15/15): REACTIVE MESOTHELIAL CELLS. NO MALIGNANT CELLS IDENTIFIED.  RADIOGRAPHIC STUDIES: I have personally reviewed the radiological images as listed and agreed with the findings in the report. Dg Chest 2 View  09/14/2015  CLINICAL DATA:  Bilateral ankle swelling. Abdominal distention. Back pain. Shallow breathing. EXAM: CHEST  2 VIEW COMPARISON:  None. FINDINGS: Poor inspiration. Normal sized heart. Minimal left lateral pleural thickening. No pleural fluid is seen posteriorly on either side. Small amount of linear density at the left lung base. Mild thoracic spine degenerative changes. IMPRESSION: Poor inspiration with a small amount of pleural and parenchymal scarring at the left lung base. Electronically Signed   By: Claudie Revering M.D.   On: 09/14/2015 19:20   Ct Chest W Contrast  09/15/2015  CLINICAL DATA:  Liver mass diagnosed on CT 1 day prior. Status post paracentesis. Fatigue. Inpatient. EXAM:  CT CHEST WITH CONTRAST TECHNIQUE: Multidetector CT imaging of the chest was performed during intravenous contrast administration. CONTRAST:  78mL OMNIPAQUE IOHEXOL 300 MG/ML  SOLN COMPARISON:  09/14/2015 CT abdomen/ pelvis. Chest radiograph from 1 day prior. FINDINGS: Mediastinum/Nodes: Normal heart size. No pericardial fluid/thickening. Left anterior descending and left circumflex coronary atherosclerosis. Great vessels are normal in course and caliber. No central pulmonary emboli. Normal visualized thyroid. Large lower esophageal varices. No pathologically enlarged axillary, mediastinal or hilar lymph nodes. Lungs/Pleura: No pneumothorax. Trace left pleural effusion. Subsegmental atelectasis in both lower lobes. No acute consolidative airspace disease, significant pulmonary nodules or lung masses. Upper abdomen: Re- demonstrated is cirrhosis and diffuse severe intrahepatic biliary ductal dilatation, with  re- demonstration of central liver 4.7 cm mass extending superiorly and inferiorly along the central bile ducts. There are stable subcentimeter hypodense lesions in the left liver lobe and simple cysts in the right liver lobe. Small volume upper abdominal ascites, decreased. Fat stranding throughout the upper abdominal mesenteric and omental fat, unchanged. Stable dilated visualized proximal common bile duct. Stable mild upper retroperitoneal adenopathy. Musculoskeletal: Stable nondisplaced healing subacute lateral left eighth rib fracture. Mild-to-moderate degenerative changes in thoracic spine. IMPRESSION: 1. No evidence of metastatic disease in the chest. 2. Trace left pleural effusion. Mild bibasilar atelectasis. No acute consolidative airspace disease. 3. Three-vessel coronary atherosclerosis. 4. Stable nondisplaced healing subacute lateral left eighth rib fracture. 5. Re- demonstration of cirrhosis, central liver mass, severe diffuse intrahepatic biliary ductal dilatation, indeterminate subcentimeter hypodense left liver lobe lesions. Small volume upper abdominal ascites, decreased. Electronically Signed   By: Ilona Sorrel M.D.   On: 09/15/2015 13:47   Ct Abdomen Pelvis W Contrast  09/14/2015  CLINICAL DATA:  Abdominal distention. Back pain. Lower extremity swelling. EXAM: CT ABDOMEN AND PELVIS WITH CONTRAST TECHNIQUE: Multidetector CT imaging of the abdomen and pelvis was performed using the standard protocol following bolus administration of intravenous contrast. CONTRAST:  121mL OMNIPAQUE IOHEXOL 300 MG/ML  SOLN COMPARISON:  None. FINDINGS: Lower chest: No significant pulmonary nodules or acute consolidative airspace disease. Subsegmental atelectasis in the basilar lower lobes. Hepatobiliary: The liver surface is diffusely prominently irregular, and there is relative hypertrophy of the left liver lobe, in keeping with cirrhosis. There is severe diffuse intrahepatic biliary ductal dilatation. There is a  lobulated 4.8 x 3.5 cm mass in the porta hepatis (series 2/ image 25), which demonstrates hypoenhancement relative to the liver parenchyma, and which demonstrates finger-like extensions into the right liver lobe along the biliary tree. 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 Simple appearing 1.9 cm liver cyst in the posterior right liver lobe. Separate simple appearing 3.4 cm liver cyst in the posterior upper right liver lobe. There are at least 2 additional subcentimeter hypodense lesions in the left liver lobe, too small to characterize. Nondistended gallbladder with nonspecific mild diffuse gallbladder wall thickening. The common bile duct is dilated up to 14 mm in diameter proximally with smooth distal tapering. Pancreas: Normal, with no mass or duct dilation. Spleen: Normal size. No mass. Adrenals/Urinary Tract: Normal adrenals. Normal kidneys with no hydronephrosis and no renal mass. Normal bladder. Stomach/Bowel: Grossly normal stomach. Normal caliber small bowel with no small bowel wall thickening. Normal appendix. Normal large bowel with no diverticulosis, large bowel wall thickening or pericolonic fat stranding. Vascular/Lymphatic: Normal caliber abdominal aorta. Patent portal, splenic and renal veins. There are large esophageal varices. There are small paraumbilical varices. There are additional portosystemic varices in the left omentum, which appear to  drain into the left pelvic deep veins. There are multiple mildly enlarged porta hepatis nodes, largest 1.4 cm (series 2/ image 30). Reproductive: Normal size prostate with nonspecific internal prostatic calcifications. Other: No pneumoperitoneum. Moderate to large volume ascites, which measures simple fluid density. Nonspecific fat stranding throughout the greater omentum and mesentery. Musculoskeletal: No aggressive appearing focal osseous lesions. Subacute healing lateral left eighth rib fracture. Mild-to-moderate  degenerative changes in the visualized thoracolumbar spine. IMPRESSION: 1. Cirrhosis. Lobulated 4.8 x 3.5 cm solid mass in the porta hepatis with finger-like extensions into the right liver lobe along the biliary tree. Severe diffuse intrahepatic biliary ductal dilatation. 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 MRI abdomen with and without intravenous contrast could be performed after paracentesis for further evaluation. Recommend GI consultation for ERCP and possible biliary decompression, which may require IR consultation for percutaneous biliary decompression. 2. Large volume ascites. 3. Large esophageal varices. Small paraumbilical varices. Additional portosystemic left omental varices. 4. Nonspecific mild porta hepatis lymphadenopathy. 5. Separate subcentimeter hypodense liver lesions are too small to characterize and could represent liver metastases. 6. Subacute lateral left eighth rib fracture. These results were called by telephone at the time of interpretation on 09/14/2015 at 7:38 pm to Dr. Lonia Skinner , who verbally acknowledged these results. Electronically Signed   By: Ilona Sorrel M.D.   On: 09/14/2015 19:40   Mr 3d Recon At Scanner  09/15/2015  CLINICAL DATA:  Cirrhosis with ascites, distal esophageal varices and lobulated mass in the porta hepatis causing biliary obstruction on CT. EXAM: MRI ABDOMEN WITHOUT AND WITH CONTRAST (INCLUDING MRCP) TECHNIQUE: Multiplanar multisequence MR imaging of the abdomen was performed both before and after the administration of intravenous contrast. Heavily T2-weighted images of the biliary and pancreatic ducts were obtained, and three-dimensional MRCP images were rendered by post processing. CONTRAST:  85mL MULTIHANCE GADOBENATE DIMEGLUMINE 529 MG/ML IV SOLN COMPARISON:  Abdominal CT 09/14/2015. FINDINGS: Unfortunately, the study is mildly motion degraded due  to the patient's inability to completely suspend respiration. Lower chest:  Small left-greater-than-right pleural effusions. Hepatobiliary: The liver contours are diffusely irregular consistent with cirrhosis. There are multiple siderotic nodules throughout the liver with diffuse suppression of hepatic signal on the in phase images consistent with hemosiderosis. There is a 1.6 cm lesion in the medial segment of the left hepatic lobe which demonstrates T1 shortening and low level enhancement following contrast. No other enhancing intrahepatic lesions are identified. There are several hepatic cysts, largest projecting from the dome of the right hepatic lobe, measuring 3.5 cm. As demonstrated on CT, there is a tubular mass within the porta hepatis which appears intra biliary. This demonstrates intermediate T2 signal, low-level enhancement and measures up to 3.8 cm transverse and 6.3 cm in length. There is resulting marked intrahepatic biliary dilatation with the left hepatic duct measuring up to 1.7 cm. The distal common bile duct is normal in caliber. Pancreas: Mildly atrophied. No evidence of pancreatic mass or pancreatic ductal dilatation. Spleen: Normal in size without focal abnormality. Adrenals/Urinary Tract: Both adrenal glands appear normal. Small renal cysts are noted. There is no suspicious renal finding or hydronephrosis. Stomach/Bowel: No evidence of bowel wall thickening, distention or surrounding inflammatory change. Vascular/Lymphatic: Multiple mildly enlarged lymph nodes within the porta hepatis are again noted. 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 Other: Ascites has decreased in volume. No suspicious peritoneal enhancement. Musculoskeletal: No acute or significant  osseous findings. IMPRESSION: 1. 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 Tissue sampling and biliary decompression recommended. 2. Prominent nonspecific lymph nodes in the porta hepatis, likely related to chronic liver disease. No definite distant metastases. 3. Macronodular cirrhosis with multiple siderotic nodules. There is one T1 hyperintense nodule in the left lobe which demonstrates low level enhancement and could reflect a dysplastic nodule. 4. Decreased volume of ascites following paracentesis. Electronically Signed   By: Richardean Sale M.D.   On: 09/15/2015 13:52   US Paracentesis  09/15/2015  Ardis Rowan, PA-C     09/15/2015 11:37 AM Successful US guided paracentesis from LLQ. Yielded 6 liters of clear yellow fluid. No immediate complications. Pt tolerated well. Specimen was sent for labs. WENDY S BLAIR PA-C 09/15/2015 11:37 AM   Mr Abd W/wo Cm/mrcp  09/15/2015  CLINICAL DATA:  Cirrhosis with ascites, distal esophageal varices and lobulated mass in the porta hepatis causing biliary obstruction on CT. EXAM: MRI ABDOMEN WITHOUT AND WITH CONTRAST (INCLUDING MRCP) TECHNIQUE: Multiplanar multisequence MR imaging of the abdomen was performed both before and after the administration of intravenous contrast. Heavily T2-weighted images of the biliary and pancreatic ducts were obtained, and three-dimensional MRCP images were rendered by post processing. CONTRAST:  80mL MULTIHANCE GADOBENATE DIMEGLUMINE 529 MG/ML IV SOLN COMPARISON:  Abdominal CT 09/14/2015. FINDINGS: Unfortunately, the study is mildly motion degraded due to the patient's inability to completely suspend respiration. Lower chest:  Small left-greater-than-right pleural effusions. Hepatobiliary: The liver contours are diffusely irregular consistent with cirrhosis. There are multiple siderotic nodules throughout the liver with diffuse suppression of hepatic signal on the in phase images consistent with hemosiderosis. There is a 1.6 cm lesion in the medial segment of the left hepatic lobe which  demonstrates T1 shortening and low level enhancement following contrast. No other enhancing intrahepatic lesions are identified. There are several hepatic cysts, largest projecting from the dome of the right hepatic lobe, measuring 3.5 cm. As demonstrated on CT, there is a tubular mass within the porta hepatis which appears intra biliary. This demonstrates intermediate T2 signal, low-level enhancement and measures up to 3.8 cm transverse and 6.3 cm in length. There is resulting marked intrahepatic biliary dilatation with the left hepatic duct measuring up to 1.7 cm. The distal common bile duct is normal in caliber. Pancreas: Mildly atrophied. No evidence of pancreatic mass or pancreatic ductal dilatation. Spleen: Normal in size without focal abnormality. Adrenals/Urinary Tract: Both adrenal glands appear normal. Small renal cysts are noted. There is no suspicious renal finding or hydronephrosis. Stomach/Bowel: No evidence of bowel wall thickening, distention or surrounding inflammatory change. Vascular/Lymphatic: Multiple mildly enlarged lymph nodes within the porta hepatis are again noted. 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 Other: Ascites has decreased in volume. No suspicious peritoneal enhancement. Musculoskeletal: No acute or significant osseous findings. IMPRESSION: 1. 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 Tissue sampling and biliary decompression recommended. 2. Prominent nonspecific lymph nodes in the porta hepatis, likely related to chronic liver disease. No definite distant metastases. 3. Macronodular cirrhosis with multiple siderotic nodules. There is one T1 hyperintense nodule in the left lobe which demonstrates low level enhancement and could reflect a dysplastic nodule. 4. Decreased volume of ascites following paracentesis.  Electronically Signed   By: Richardean Sale M.D.   On: 09/15/2015 13:52  Ir Int Lianne Cure Biliary Drain With Cholangiogram  09/17/2015  CLINICAL DATA:  Biliary obstruction EXAM: IR INT-EXT BILIARY DRAIN W/ CHOLANGIOGRAM; IR BALLOON DILATION OF BILIARY DUCT/AMPULLA FLUOROSCOPY TIME:  9 minutes and 6 seconds MEDICATIONS AND MEDICAL HISTORY: Versed 4 mg, Fentanyl 100 mcg. Additional Medications: None.  Patient was on Rocephin. ANESTHESIA/SEDATION: Moderate sedation time: 60 minutes CONTRAST:  15 cc Omnipaque 300 PROCEDURE: The procedure, risks, benefits, and alternatives were explained to the patient. Questions regarding the procedure were encouraged and answered. The patient understands and consents to the procedure. The upper abdominal region was prepped with Betadine in a sterile fashion, and a sterile drape was applied covering the operative field. A sterile gown and sterile gloves were used for the procedure. Under sonographic guidance, a Chiba needle was inserted into a left hepatic duct. Contrast was injected opacifying the biliary tree. The needle was removed over a 018 wire. The Accustick transitional dilator was advanced over the wire to the mid biliary tree. It could not be advanced into the central biliary tree secondary to a very resistant partial obstruction. An Amplatz wire was advanced through the transitional dilator. This was advanced into the central biliary tree. Again, the 6 [place] transitional dilator of the Accustick set could not be advanced across the Amplatz wire. A 4 French glide catheter was advanced across the Amplatz wire into the central biliary tree. The glide catheter was advanced over a Bentson wire into the duodenum then was again exchange for the Amplatz wire. Initial attempts at dilating across the partial obstruction with serial dilators starting from 5 [place] were unsuccessful. A 5 French sheath was advanced over the Amplatz into the peripheral biliary tree a 4 French balloon was  utilized to dilate the partial obstruction. The tract was then easily dilated to 8 [place] an 8 [place] biliary drain was then advanced over the Amplatz into the duodenum. Two additional sideholes were cut above the upper marker. It was looped and string fixed in the duodenum then sewn to the skin. The initial aspirate was sent for a cytology analysis. Contrast was injected. FINDINGS: Percutaneous cholangiography into the left the biliary tree demonstrates very limited opacification the the bile ducts. This was performed in an attempt to prevent sepsis. Final images demonstrate left internal external biliary drain placement with its tip coiled in the duodenum. Central left-sided ducts only were opacified by contrast. COMPLICATIONS: None IMPRESSION: Successful left internal external biliary drain placement. An 8 French device was placed secondary to a resistant biliary obstruction. Initial bile aspirate was sent for cytology. Electronically Signed   By: Marybelle Killings M.D.   On: 09/17/2015 08:33   Ir Balloon Dilation Of Biliary Ducts/ampulla  09/17/2015  CLINICAL DATA:  Biliary obstruction EXAM: IR INT-EXT BILIARY DRAIN W/ CHOLANGIOGRAM; IR BALLOON DILATION OF BILIARY DUCT/AMPULLA FLUOROSCOPY TIME:  9 minutes and 6 seconds MEDICATIONS AND MEDICAL HISTORY: Versed 4 mg, Fentanyl 100 mcg. Additional Medications: None.  Patient was on Rocephin. ANESTHESIA/SEDATION: Moderate sedation time: 60 minutes CONTRAST:  15 cc Omnipaque 300 PROCEDURE: The procedure, risks, benefits, and alternatives were explained to the patient. Questions regarding the procedure were encouraged and answered. The patient understands and consents to the procedure. The upper abdominal region was prepped with Betadine in a sterile fashion, and a sterile drape was applied covering the operative field. A sterile gown and sterile gloves were used for the procedure. Under sonographic guidance, a Chiba needle was inserted into a left hepatic duct. Contrast  was injected opacifying the biliary tree.  The needle was removed over a 018 wire. The Accustick transitional dilator was advanced over the wire to the mid biliary tree. It could not be advanced into the central biliary tree secondary to a very resistant partial obstruction. An Amplatz wire was advanced through the transitional dilator. This was advanced into the central biliary tree. Again, the 6 [place] transitional dilator of the Accustick set could not be advanced across the Amplatz wire. A 4 French glide catheter was advanced across the Amplatz wire into the central biliary tree. The glide catheter was advanced over a Bentson wire into the duodenum then was again exchange for the Amplatz wire. Initial attempts at dilating across the partial obstruction with serial dilators starting from 5 [place] were unsuccessful. A 5 French sheath was advanced over the Amplatz into the peripheral biliary tree a 4 French balloon was utilized to dilate the partial obstruction. The tract was then easily dilated to 8 [place] an 8 [place] biliary drain was then advanced over the Amplatz into the duodenum. Two additional sideholes were cut above the upper marker. It was looped and string fixed in the duodenum then sewn to the skin. The initial aspirate was sent for a cytology analysis. Contrast was injected. FINDINGS: Percutaneous cholangiography into the left the biliary tree demonstrates very limited opacification the the bile ducts. This was performed in an attempt to prevent sepsis. Final images demonstrate left internal external biliary drain placement with its tip coiled in the duodenum. Central left-sided ducts only were opacified by contrast. COMPLICATIONS: None IMPRESSION: Successful left internal external biliary drain placement. An 8 French device was placed secondary to a resistant biliary obstruction. Initial bile aspirate was sent for cytology. Electronically Signed   By: Marybelle Killings M.D.   On: 09/17/2015 08:33     ASSESSMENT & PLAN: 59 year old male, without significant past medical history, not a heavy drinker, was found to have a liver mass, liver cirrhosis with ascites and esophageal varices.  1. Right liver mass, likely cholangiocarcinoma -I reviewed his CT scan, image findings are most concerning for cholangiocarcinoma. His tumor marker CA 19.9 was significantly elevated, which supports cholangiole carcinoma. -Given his underlying liver cirrhosis, hepatocellular carcinoma is also a possibility, but given its normal AFP, this is less likely -We would like to have a tissue diagnosis. I spoke with the interventional radiologist Dr. Barbie Banner, he will try brush or biopsy When patient returns for follow-up in a few weeks -We'll present his case in our tumor board tomorrow, we'll discuss with Dr. Barry Dienes to see if this is a resectable. Giving the central location, he may be difficult for complete surgical resection -We reviewed the role of radiation and chemotherapy, if surgery is not feasible. -His bilirubinemia will likely improve, hopefully resolve, after the biliary drainage. He has relatively good performance status, she'll be able to tolerate chemotherapy. Although his liver cirrhosis may make his tolerance to chemotherapy lower.  -I will set up his follow-up appointment with me in our [facility] upon his discharge.  2. Liver cirrhosis, etiology unclear, with large volume ascites and esophageal versus. -He will follow-up with GI Dr. Fuller Plan  Recommendations -We'll review his case in our tumor board tomorrow morning, to see if it is resectable -I'll set up his follow-up appointment with me upon his discharge.   All questions were answered. The patient knows to call the clinic with any problems, questions or concerns. I spent 40 minutes counseling the patient face to face. The total time spent in the appointment was  55 minutes and more than 50% was on counseling.     [name], MD 09/17/2015 5:21  PM

## 2015-09-16 NOTE — H&P (Signed)
Chief Complaint: Patient was seen in consultation today for biliary obstruction  Chief Complaint  Patient presents with  . Bloated  . Back Pain   at the request of GI  Referring Physician(s): GI- Dr. Fuller Plan   History of Present Illness: Eric Schroeder is a 59 y.o. male presented with complaints of abdominal distention, found to have ascites s/p paracentesis on 09/15/15 of 6 liters. Imaging revealed evidence of cirrhosis, biliary obstruction secondary to hepatic mass. Labs reveal coagulopathy and elevated LFT's and bilirubin. GI has seen the patient and IR received request for image guided PTC, biliary drain and biopsy. The patient states his abdominal bloating is much better after the paracentesis and he denies any ongoing abdominal pain. He denies any chest pain, shortness of breath or palpitations. He denies any active signs of bleeding. He denies any recent fever or chills. The patient denies any history of sleep apnea or chronic oxygen use. He has no known complications to sedation.    History reviewed. No pertinent past medical history.  History reviewed. No pertinent past surgical history.  Allergies: Review of patient's allergies indicates no known allergies.  Medications: Prior to Admission medications   Medication Sig Start Date End Date Taking? Authorizing Provider  Acetaminophen (TYLENOL PO) Take 2 tablets by mouth every 4 (four) hours as needed (pain).   Yes Historical Provider, MD     History reviewed. No pertinent family history.  Social History   Social History  . Marital Status: Married    Spouse Name: N/A  . Number of Children: N/A  . Years of Education: N/A   Social History Main Topics  . Smoking status: Never Smoker   . Smokeless tobacco: None  . Alcohol Use: Yes  . Drug Use: No  . Sexual Activity: Not Asked   Other Topics Concern  . None   Social History Narrative    Review of Systems: A 12 point ROS discussed and pertinent positives are  indicated in the HPI above.  All other systems are negative.  Review of Systems  Vital Signs: BP 104/64 mmHg  Pulse 120  Temp(Src) 97.9 F (36.6 C) (Oral)  Resp 18  Ht 5' 9.5" (1.765 m)  Wt 215 lb 2.7 oz (97.6 kg)  BMI 31.33 kg/m2  SpO2 100%  Physical Exam  Constitutional: He is oriented to person, place, and time. No distress.  HENT:  Head: Normocephalic and atraumatic.  Cardiovascular: Regular rhythm.  Exam reveals no gallop and no friction rub.   No murmur heard. Tachycardic  Pulmonary/Chest: Effort normal and breath sounds normal. No respiratory distress. He has no wheezes. He has no rales.  Abdominal: Soft. Bowel sounds are normal. He exhibits no distension. There is no tenderness.  Neurological: He is alert and oriented to person, place, and time.  Skin: He is not diaphoretic.    Mallampati Score:  MD Evaluation Airway: WNL Heart: WNL Abdomen: WNL Chest/ Lungs: WNL ASA  Classification: 3 Mallampati/Airway Score: Two  Imaging: Dg Chest 2 View  09/14/2015  CLINICAL DATA:  Bilateral ankle swelling. Abdominal distention. Back pain. Shallow breathing. EXAM: CHEST  2 VIEW COMPARISON:  None. FINDINGS: Poor inspiration. Normal sized heart. Minimal left lateral pleural thickening. No pleural fluid is seen posteriorly on either side. Small amount of linear density at the left lung base. Mild thoracic spine degenerative changes. IMPRESSION: Poor inspiration with a small amount of pleural and parenchymal scarring at the left lung base. Electronically Signed   By: Remo Lipps  Joneen Caraway M.D.   On: 09/14/2015 19:20   Ct Chest W Contrast  09/15/2015  CLINICAL DATA:  Liver mass diagnosed on CT 1 day prior. Status post paracentesis. Fatigue. Inpatient. EXAM: CT CHEST WITH CONTRAST TECHNIQUE: Multidetector CT imaging of the chest was performed during intravenous contrast administration. CONTRAST:  29m OMNIPAQUE IOHEXOL 300 MG/ML  SOLN COMPARISON:  09/14/2015 CT abdomen/ pelvis. Chest radiograph  from 1 day prior. FINDINGS: Mediastinum/Nodes: Normal heart size. No pericardial fluid/thickening. Left anterior descending and left circumflex coronary atherosclerosis. Great vessels are normal in course and caliber. No central pulmonary emboli. Normal visualized thyroid. Large lower esophageal varices. No pathologically enlarged axillary, mediastinal or hilar lymph nodes. Lungs/Pleura: No pneumothorax. Trace left pleural effusion. Subsegmental atelectasis in both lower lobes. No acute consolidative airspace disease, significant pulmonary nodules or lung masses. Upper abdomen: Re- demonstrated is cirrhosis and diffuse severe intrahepatic biliary ductal dilatation, with re- demonstration of central liver 4.7 cm mass extending superiorly and inferiorly along the central bile ducts. There are stable subcentimeter hypodense lesions in the left liver lobe and simple cysts in the right liver lobe. Small volume upper abdominal ascites, decreased. Fat stranding throughout the upper abdominal mesenteric and omental fat, unchanged. Stable dilated visualized proximal common bile duct. Stable mild upper retroperitoneal adenopathy. Musculoskeletal: Stable nondisplaced healing subacute lateral left eighth rib fracture. Mild-to-moderate degenerative changes in thoracic spine. IMPRESSION: 1. No evidence of metastatic disease in the chest. 2. Trace left pleural effusion. Mild bibasilar atelectasis. No acute consolidative airspace disease. 3. Three-vessel coronary atherosclerosis. 4. Stable nondisplaced healing subacute lateral left eighth rib fracture. 5. Re- demonstration of cirrhosis, central liver mass, severe diffuse intrahepatic biliary ductal dilatation, indeterminate subcentimeter hypodense left liver lobe lesions. Small volume upper abdominal ascites, decreased. Electronically Signed   By: JIlona SorrelM.D.   On: 09/15/2015 13:47   Ct Abdomen Pelvis W Contrast  09/14/2015  CLINICAL DATA:  Abdominal distention. Back pain.  Lower extremity swelling. EXAM: CT ABDOMEN AND PELVIS WITH CONTRAST TECHNIQUE: Multidetector CT imaging of the abdomen and pelvis was performed using the standard protocol following bolus administration of intravenous contrast. CONTRAST:  1028mOMNIPAQUE IOHEXOL 300 MG/ML  SOLN COMPARISON:  None. FINDINGS: Lower chest: No significant pulmonary nodules or acute consolidative airspace disease. Subsegmental atelectasis in the basilar lower lobes. Hepatobiliary: The liver surface is diffusely prominently irregular, and there is relative hypertrophy of the left liver lobe, in keeping with cirrhosis. There is severe diffuse intrahepatic biliary ductal dilatation. There is a lobulated 4.8 x 3.5 cm mass in the porta hepatis (series 2/ image 25), which demonstrates hypoenhancement relative to the liver parenchyma, and which demonstrates finger-like extensions into the right liver lobe along the biliary tree. 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 Simple appearing 1.9 cm liver cyst in the posterior right liver lobe. Separate simple appearing 3.4 cm liver cyst in the posterior upper right liver lobe. There are at least 2 additional subcentimeter hypodense lesions in the left liver lobe, too small to characterize. Nondistended gallbladder with nonspecific mild diffuse gallbladder wall thickening. The common bile duct is dilated up to 14 mm in diameter proximally with smooth distal tapering. Pancreas: Normal, with no mass or duct dilation. Spleen: Normal size. No mass. Adrenals/Urinary Tract: Normal adrenals. Normal kidneys with no hydronephrosis and no renal mass. Normal bladder. Stomach/Bowel: Grossly normal stomach. Normal caliber small bowel with no small bowel wall thickening. Normal appendix. Normal large bowel with no diverticulosis, large bowel wall thickening or pericolonic fat stranding.  Vascular/Lymphatic: Normal caliber abdominal aorta. Patent portal, splenic and renal veins.  There are large esophageal varices. There are small paraumbilical varices. There are additional portosystemic varices in the left omentum, which appear to drain into the left pelvic deep veins. There are multiple mildly enlarged porta hepatis nodes, largest 1.4 cm (series 2/ image 30). Reproductive: Normal size prostate with nonspecific internal prostatic calcifications. Other: No pneumoperitoneum. Moderate to large volume ascites, which measures simple fluid density. Nonspecific fat stranding throughout the greater omentum and mesentery. Musculoskeletal: No aggressive appearing focal osseous lesions. Subacute healing lateral left eighth rib fracture. Mild-to-moderate degenerative changes in the visualized thoracolumbar spine. IMPRESSION: 1. Cirrhosis. Lobulated 4.8 x 3.5 cm solid mass in the porta hepatis with finger-like extensions into the right liver lobe along the biliary tree. Severe diffuse intrahepatic biliary ductal dilatation. 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 MRI abdomen with and without intravenous contrast could be performed after paracentesis for further evaluation. Recommend GI consultation for ERCP and possible biliary decompression, which may require IR consultation for percutaneous biliary decompression. 2. Large volume ascites. 3. Large esophageal varices. Small paraumbilical varices. Additional portosystemic left omental varices. 4. Nonspecific mild porta hepatis lymphadenopathy. 5. Separate subcentimeter hypodense liver lesions are too small to characterize and could represent liver metastases. 6. Subacute lateral left eighth rib fracture. These results were called by telephone at the time of interpretation on 09/14/2015 at 7:38 pm to Dr. Lonia Skinner , who verbally acknowledged these results. Electronically Signed   By: Ilona Sorrel M.D.   On: 09/14/2015 19:40   Mr 3d Recon At  Scanner  09/15/2015  CLINICAL DATA:  Cirrhosis with ascites, distal esophageal varices and lobulated mass in the porta hepatis causing biliary obstruction on CT. EXAM: MRI ABDOMEN WITHOUT AND WITH CONTRAST (INCLUDING MRCP) TECHNIQUE: Multiplanar multisequence MR imaging of the abdomen was performed both before and after the administration of intravenous contrast. Heavily T2-weighted images of the biliary and pancreatic ducts were obtained, and three-dimensional MRCP images were rendered by post processing. CONTRAST:  71m MULTIHANCE GADOBENATE DIMEGLUMINE 529 MG/ML IV SOLN COMPARISON:  Abdominal CT 09/14/2015. FINDINGS: Unfortunately, the study is mildly motion degraded due to the patient's inability to completely suspend respiration. Lower chest:  Small left-greater-than-right pleural effusions. Hepatobiliary: The liver contours are diffusely irregular consistent with cirrhosis. There are multiple siderotic nodules throughout the liver with diffuse suppression of hepatic signal on the in phase images consistent with hemosiderosis. There is a 1.6 cm lesion in the medial segment of the left hepatic lobe which demonstrates T1 shortening and low level enhancement following contrast. No other enhancing intrahepatic lesions are identified. There are several hepatic cysts, largest projecting from the dome of the right hepatic lobe, measuring 3.5 cm. As demonstrated on CT, there is a tubular mass within the porta hepatis which appears intra biliary. This demonstrates intermediate T2 signal, low-level enhancement and measures up to 3.8 cm transverse and 6.3 cm in length. There is resulting marked intrahepatic biliary dilatation with the left hepatic duct measuring up to 1.7 cm. The distal common bile duct is normal in caliber. Pancreas: Mildly atrophied. No evidence of pancreatic mass or pancreatic ductal dilatation. Spleen: Normal in size without focal abnormality. Adrenals/Urinary Tract: Both adrenal glands appear  normal. Small renal cysts are noted. There is no suspicious renal finding or hydronephrosis. Stomach/Bowel: No evidence of bowel wall thickening, distention or surrounding inflammatory change. Vascular/Lymphatic: Multiple mildly enlarged lymph nodes within the  porta hepatis are again noted. 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 Other: Ascites has decreased in volume. No suspicious peritoneal enhancement. Musculoskeletal: No acute or significant osseous findings. IMPRESSION: 1. 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 Tissue sampling and biliary decompression recommended. 2. Prominent nonspecific lymph nodes in the porta hepatis, likely related to chronic liver disease. No definite distant metastases. 3. Macronodular cirrhosis with multiple siderotic nodules. There is one T1 hyperintense nodule in the left lobe which demonstrates low level enhancement and could reflect a dysplastic nodule. 4. Decreased volume of ascites following paracentesis. Electronically Signed   By: Richardean Sale M.D.   On: 09/15/2015 13:52   US Paracentesis  09/15/2015  Ardis Rowan, PA-C     09/15/2015 11:37 AM Successful US guided paracentesis from LLQ. Yielded 6 liters of clear yellow fluid. No immediate complications. Pt tolerated well. Specimen was sent for labs. WENDY S BLAIR PA-C 09/15/2015 11:37 AM   Mr Abd W/wo Cm/mrcp  09/15/2015  CLINICAL DATA:  Cirrhosis with ascites, distal esophageal varices and lobulated mass in the porta hepatis causing biliary obstruction on CT. EXAM: MRI ABDOMEN WITHOUT AND WITH CONTRAST (INCLUDING MRCP) TECHNIQUE: Multiplanar multisequence MR imaging of the abdomen was performed both before and after the administration of intravenous contrast. Heavily T2-weighted images of the biliary and pancreatic ducts were obtained, and  three-dimensional MRCP images were rendered by post processing. CONTRAST:  41m MULTIHANCE GADOBENATE DIMEGLUMINE 529 MG/ML IV SOLN COMPARISON:  Abdominal CT 09/14/2015. FINDINGS: Unfortunately, the study is mildly motion degraded due to the patient's inability to completely suspend respiration. Lower chest:  Small left-greater-than-right pleural effusions. Hepatobiliary: The liver contours are diffusely irregular consistent with cirrhosis. There are multiple siderotic nodules throughout the liver with diffuse suppression of hepatic signal on the in phase images consistent with hemosiderosis. There is a 1.6 cm lesion in the medial segment of the left hepatic lobe which demonstrates T1 shortening and low level enhancement following contrast. No other enhancing intrahepatic lesions are identified. There are several hepatic cysts, largest projecting from the dome of the right hepatic lobe, measuring 3.5 cm. As demonstrated on CT, there is a tubular mass within the porta hepatis which appears intra biliary. This demonstrates intermediate T2 signal, low-level enhancement and measures up to 3.8 cm transverse and 6.3 cm in length. There is resulting marked intrahepatic biliary dilatation with the left hepatic duct measuring up to 1.7 cm. The distal common bile duct is normal in caliber. Pancreas: Mildly atrophied. No evidence of pancreatic mass or pancreatic ductal dilatation. Spleen: Normal in size without focal abnormality. Adrenals/Urinary Tract: Both adrenal glands appear normal. Small renal cysts are noted. There is no suspicious renal finding or hydronephrosis. Stomach/Bowel: No evidence of bowel wall thickening, distention or surrounding inflammatory change. Vascular/Lymphatic: Multiple mildly enlarged lymph nodes within the porta hepatis are again noted. 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 Other: Ascites has decreased in volume. No suspicious  peritoneal enhancement. Musculoskeletal: No acute or significant osseous findings. IMPRESSION: 1. 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 Tissue sampling and biliary decompression recommended. 2. Prominent nonspecific lymph nodes in the porta hepatis, likely related to chronic liver disease. No definite distant metastases. 3. Macronodular cirrhosis with multiple siderotic nodules. There is one T1 hyperintense nodule in the left  lobe which demonstrates low level enhancement and could reflect a dysplastic nodule. 4. Decreased volume of ascites following paracentesis. Electronically Signed   By: Richardean Sale M.D.   On: 09/15/2015 13:52    Labs:  CBC:  Recent Labs  09/14/15 1628 09/15/15 0411 09/16/15 0430  WBC 6.7 6.1 5.5  HGB 13.6 12.6* 12.8*  HCT 38.9* 35.5* 36.5*  PLT 128* 106* 106*    COAGS:  Recent Labs  09/15/15 1208  INR 1.57*  APTT 38*    BMP:  Recent Labs  09/14/15 1628 09/15/15 0411 09/16/15 0430  NA 136 134* 133*  K 4.2 4.2 4.2  CL 102 103 101  CO2 _0 GLUCOSE 98 97 108*  BUN _1 CALCIUM 8.7* 8.4* 8.2*  CREATININE 0.69 0.52* 0.62  GFRNONAA >60 >60 >60  GFRAA >60 >60 >60    LIVER FUNCTION TESTS:  Recent Labs  09/14/15 1628 09/15/15 0411 09/16/15 0430  BILITOT 4.0* 5.9* 3.6*  AST 103* 107* 109*  ALT 48 48 50  ALKPHOS 221* 179* 177*  PROT 7.4 6.2* 6.1*  ALBUMIN 2.3* 2.0* 1.8*    TUMOR MARKERS:  Recent Labs  09/15/15 0411  AFPTM 2.0  CEA 3.2    Assessment and Plan: Abdominal distention- Ascites s/p paracentesis 09/15/15 of 6 liters, cytology pending  Cirrhosis Biliary obstruction secondary to hepatic mass, T. Bili 3.6 (5.9) Coagulopathy secondary to above Seen by GI and felt unable to access with ERCP Request for IR consult with possible image guided percutaneous transhepatic cholangiogram, biliary drain and possible  paracentesis The patient has been NPO, no blood thinners taken, labs and vitals have been reviewed. Discussed INR  with Dr. Barbie Banner will give 1 unit of FFP prior to procedure Risks and Benefits discussed with the patient including, but not limited to bleeding, infection which may lead to sepsis or even death and damage to adjacent structures. All of the patient's questions were answered, patient is agreeable to proceed. Consent signed and in chart.    Thank you for this interesting consult.  I greatly enjoyed meeting Eric Schroeder and look forward to participating in their care.  A copy of this report was sent to the requesting provider on this date.  SignedHedy Jacob 09/16/2015, 1:22 PM   I spent a total of 20 Minutes in face to face in clinical consultation, greater than 50% of which was counseling/coordinating care for biliary obstruction with hepatic mass.

## 2015-09-16 NOTE — Progress Notes (Signed)
[facility]                                                                              Patient Demographics  Eric Schroeder, is a 59 y.o. male, DOB - Mar 06, 1956, [id number]  Admit date - 09/14/2015   Admitting Physician Reubin Milan, MD  Outpatient Primary MD for the patient is No primary care provider on file.  LOS - 2   Chief Complaint  Patient presents with  . Bloated  . Back Pain      HPI on 09/14/2015 by Dr. Gerri Lins Eric Schroeder is a 59 y.o. male with no significant past medical history who comes to the emergency department with complaints of abdominal distention, abdominal pain, bilateral flank pain, lower extremity edema for about 2 weeks. This was preceded by progressively worse bloating, early satiety for most of this year. He also endorses a 35 pound weight loss during this timeframe as well. He states that he adjusted his eating habits to this, did not think that there was any major health issue on until his abdominal distention and lower extremity edema. He denies chest pain, palpitations, dizziness, diaphoresis, PND, but complains of dyspnea and orthopnea (he feels his "abdomen is pushing on his lungs"). He denies fever, chills, but feels fatigued.  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 -CT chest: No evidence of metastatic disease in the chest, trace left pleural effusion -AFP, mitochondrial antibodies, hepatitis panel pending  -Oncology consultation appreciated  -Pending further recommendation from GI, ?ERCP vs PCT  placement  Elevated bilirubin, AST, INR -Likely secondary to the above -Continue to monitor CMP  Urinary tract infection -UA showed 0-5 WBC, positive nitrites leukocytes, few bacteria -Patient was started on ceftriaxone -Urine culture pending (was not collected upon admission before antibiotics started)  Code Status: Full  Family Communication: None at bedside  Disposition Plan: Admitted. Pending further recommendations from GI.  Time Spent in minutes   30 minutes  Procedures  US guided paracentesis MRCP  Consults   Gastroenterology Interventional radiology Oncology   DVT Prophylaxis  SCDs  Lab Results  Component Value Date   PLT 106* 09/16/2015    Medications  Scheduled Meds: . cefTRIAXone (ROCEPHIN)  IV  1 g Intravenous Q24H  . sodium chloride  3 mL Intravenous Q12H  . sodium chloride  3 mL Intravenous Q12H   Continuous Infusions:  PRN Meds:.sodium chloride, LORazepam, ondansetron **OR** ondansetron (ZOFRAN) IV, sodium chloride  Antibiotics    Anti-infectives    Start     Dose/Rate Route Frequency Ordered Stop   09/15/15 2000  cefTRIAXone (ROCEPHIN) 1 g in dextrose 5 % 50 mL IVPB     1 g 100 mL/hr over 30 Minutes Intravenous Every 24 hours 09/14/15 2230     09/14/15 2045  cefTRIAXone (ROCEPHIN) 1 g in dextrose 5 % 50 mL IVPB  1 g 100 mL/hr over 30 Minutes Intravenous  Once 09/14/15 2031 09/14/15 2111      Subjective:   Eric Schroeder seen and examined today.  Patient feels his abdominal bloating has improved.  Denies belching or changes in bowel pattern.  Denies Denies chest pain or shortness of breath at this time.   Objective:   Filed Vitals:   09/15/15 1113 09/15/15 1500 09/15/15 2041 09/16/15 0528  BP: 141/91 129/83 128/74 119/72  Pulse:  86 84 85  Temp:  98.2 F (36.8 C) 98.7 F (37.1 C) 97.3 F (36.3 C)  TempSrc:  Oral Oral Oral  Resp:  18 16 16   Height:      Weight:      SpO2:  100% 100% 100%    Wt Readings from Last 3  Encounters:  09/14/15 97.6 kg (215 lb 2.7 oz)     Intake/Output Summary (Last 24 hours) at 09/16/15 0950 Last data filed at 09/16/15 [id number]  Gross per 24 hour  Intake   1322 ml  Output      0 ml  Net   1322 ml    Exam  General: Well developed, well nourished, NAD  HEENT: NCAT, mild Icteic Sclera, mucous membranes moist.   Cardiovascular: S1 S2 auscultated, RRR, no murmurs  Respiratory: Clear to auscultation bilaterally with equal chest rise  Abdomen: Soft, nontender, distended, + bowel sounds  Extremities: warm dry without cyanosis clubbing or edema  Neuro: AAOx3, nonfocal  Psych: Normal affect and demeanor, pleasant  Data Review   Micro Results Recent Results (from the past 240 hour(s))  Body fluid culture     Status: None (Preliminary result)   Collection Time: 09/15/15 10:34 AM  Result Value Ref Range Status   Specimen Description PERITONEAL FLUID  Final   Special Requests NONE  Final   Gram Stain   Final    FEW WBC PRESENT, PREDOMINANTLY MONONUCLEAR NO ORGANISMS SEEN Results Called to: [place] [id number] SEVERAL TIMES NO ANSWER [id number] [place] Performed at [facility]    Culture PENDING  Incomplete   Report Status PENDING  Incomplete    Radiology Reports Dg Chest 2 View  09/14/2015  CLINICAL DATA:  Bilateral ankle swelling. Abdominal distention. Back pain. Shallow breathing. EXAM: CHEST  2 VIEW COMPARISON:  None. FINDINGS: Poor inspiration. Normal sized heart. Minimal left lateral pleural thickening. No pleural fluid is seen posteriorly on either side. Small amount of linear density at the left lung base. Mild thoracic spine degenerative changes. IMPRESSION: Poor inspiration with a small amount of pleural and parenchymal scarring at the left lung base. Electronically Signed   By: Claudie Revering M.D.   On: 09/14/2015 19:20   Ct Chest W Contrast  09/15/2015  CLINICAL DATA:  Liver mass diagnosed on CT 1 day prior. Status post paracentesis. Fatigue. Inpatient.  EXAM: CT CHEST WITH CONTRAST TECHNIQUE: Multidetector CT imaging of the chest was performed during intravenous contrast administration. CONTRAST:  17mL OMNIPAQUE IOHEXOL 300 MG/ML  SOLN COMPARISON:  09/14/2015 CT abdomen/ pelvis. Chest radiograph from 1 day prior. FINDINGS: Mediastinum/Nodes: Normal heart size. No pericardial fluid/thickening. Left anterior descending and left circumflex coronary atherosclerosis. Great vessels are normal in course and caliber. No central pulmonary emboli. Normal visualized thyroid. Large lower esophageal varices. No pathologically enlarged axillary, mediastinal or hilar lymph nodes. Lungs/Pleura: No pneumothorax. Trace left pleural effusion. Subsegmental atelectasis in both lower lobes. No acute consolidative airspace disease, significant pulmonary nodules or lung masses. Upper abdomen: Re- demonstrated is cirrhosis  and diffuse severe intrahepatic biliary ductal dilatation, with re- demonstration of central liver 4.7 cm mass extending superiorly and inferiorly along the central bile ducts. There are stable subcentimeter hypodense lesions in the left liver lobe and simple cysts in the right liver lobe. Small volume upper abdominal ascites, decreased. Fat stranding throughout the upper abdominal mesenteric and omental fat, unchanged. Stable dilated visualized proximal common bile duct. Stable mild upper retroperitoneal adenopathy. Musculoskeletal: Stable nondisplaced healing subacute lateral left eighth rib fracture. Mild-to-moderate degenerative changes in thoracic spine. IMPRESSION: 1. No evidence of metastatic disease in the chest. 2. Trace left pleural effusion. Mild bibasilar atelectasis. No acute consolidative airspace disease. 3. Three-vessel coronary atherosclerosis. 4. Stable nondisplaced healing subacute lateral left eighth rib fracture. 5. Re- demonstration of cirrhosis, central liver mass, severe diffuse intrahepatic biliary ductal dilatation, indeterminate subcentimeter  hypodense left liver lobe lesions. Small volume upper abdominal ascites, decreased. Electronically Signed   By: Ilona Sorrel M.D.   On: 09/15/2015 13:47   Ct Abdomen Pelvis W Contrast  09/14/2015  CLINICAL DATA:  Abdominal distention. Back pain. Lower extremity swelling. EXAM: CT ABDOMEN AND PELVIS WITH CONTRAST TECHNIQUE: Multidetector CT imaging of the abdomen and pelvis was performed using the standard protocol following bolus administration of intravenous contrast. CONTRAST:  118mL OMNIPAQUE IOHEXOL 300 MG/ML  SOLN COMPARISON:  None. FINDINGS: Lower chest: No significant pulmonary nodules or acute consolidative airspace disease. Subsegmental atelectasis in the basilar lower lobes. Hepatobiliary: The liver surface is diffusely prominently irregular, and there is relative hypertrophy of the left liver lobe, in keeping with cirrhosis. There is severe diffuse intrahepatic biliary ductal dilatation. There is a lobulated 4.8 x 3.5 cm mass in the porta hepatis (series 2/ image 25), which demonstrates hypoenhancement relative to the liver parenchyma, and which demonstrates finger-like extensions into the right liver lobe along the biliary tree. 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 Simple appearing 1.9 cm liver cyst in the posterior right liver lobe. Separate simple appearing 3.4 cm liver cyst in the posterior upper right liver lobe. There are at least 2 additional subcentimeter hypodense lesions in the left liver lobe, too small to characterize. Nondistended gallbladder with nonspecific mild diffuse gallbladder wall thickening. The common bile duct is dilated up to 14 mm in diameter proximally with smooth distal tapering. Pancreas: Normal, with no mass or duct dilation. Spleen: Normal size. No mass. Adrenals/Urinary Tract: Normal adrenals. Normal kidneys with no hydronephrosis and no renal mass. Normal bladder. Stomach/Bowel: Grossly normal stomach. Normal caliber small bowel  with no small bowel wall thickening. Normal appendix. Normal large bowel with no diverticulosis, large bowel wall thickening or pericolonic fat stranding. Vascular/Lymphatic: Normal caliber abdominal aorta. Patent portal, splenic and renal veins. There are large esophageal varices. There are small paraumbilical varices. There are additional portosystemic varices in the left omentum, which appear to drain into the left pelvic deep veins. There are multiple mildly enlarged porta hepatis nodes, largest 1.4 cm (series 2/ image 30). Reproductive: Normal size prostate with nonspecific internal prostatic calcifications. Other: No pneumoperitoneum. Moderate to large volume ascites, which measures simple fluid density. Nonspecific fat stranding throughout the greater omentum and mesentery. Musculoskeletal: No aggressive appearing focal osseous lesions. Subacute healing lateral left eighth rib fracture. Mild-to-moderate degenerative changes in the visualized thoracolumbar spine. IMPRESSION: 1. Cirrhosis. Lobulated 4.8 x 3.5 cm solid mass in the porta hepatis with finger-like extensions into the right liver lobe along the biliary tree. Severe diffuse intrahepatic biliary ductal dilatation. 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 MRI abdomen with and without intravenous contrast could be performed after paracentesis for further evaluation. Recommend GI consultation for ERCP and possible biliary decompression, which may require IR consultation for percutaneous biliary decompression. 2. Large volume ascites. 3. Large esophageal varices. Small paraumbilical varices. Additional portosystemic left omental varices. 4. Nonspecific mild porta hepatis lymphadenopathy. 5. Separate subcentimeter hypodense liver lesions are too small to characterize and could represent liver metastases. 6. Subacute lateral left eighth rib fracture.  These results were called by telephone at the time of interpretation on 09/14/2015 at 7:38 pm to Dr. Lonia Skinner , who verbally acknowledged these results. Electronically Signed   By: Ilona Sorrel M.D.   On: 09/14/2015 19:40   Mr 3d Recon At Scanner  09/15/2015  CLINICAL DATA:  Cirrhosis with ascites, distal esophageal varices and lobulated mass in the porta hepatis causing biliary obstruction on CT. EXAM: MRI ABDOMEN WITHOUT AND WITH CONTRAST (INCLUDING MRCP) TECHNIQUE: Multiplanar multisequence MR imaging of the abdomen was performed both before and after the administration of intravenous contrast. Heavily T2-weighted images of the biliary and pancreatic ducts were obtained, and three-dimensional MRCP images were rendered by post processing. CONTRAST:  88mL MULTIHANCE GADOBENATE DIMEGLUMINE 529 MG/ML IV SOLN COMPARISON:  Abdominal CT 09/14/2015. FINDINGS: Unfortunately, the study is mildly motion degraded due to the patient's inability to completely suspend respiration. Lower chest:  Small left-greater-than-right pleural effusions. Hepatobiliary: The liver contours are diffusely irregular consistent with cirrhosis. There are multiple siderotic nodules throughout the liver with diffuse suppression of hepatic signal on the in phase images consistent with hemosiderosis. There is a 1.6 cm lesion in the medial segment of the left hepatic lobe which demonstrates T1 shortening and low level enhancement following contrast. No other enhancing intrahepatic lesions are identified. There are several hepatic cysts, largest projecting from the dome of the right hepatic lobe, measuring 3.5 cm. As demonstrated on CT, there is a tubular mass within the porta hepatis which appears intra biliary. This demonstrates intermediate T2 signal, low-level enhancement and measures up to 3.8 cm transverse and 6.3 cm in length. There is resulting marked intrahepatic biliary dilatation with the left hepatic duct measuring up to 1.7 cm. The  distal common bile duct is normal in caliber. Pancreas: Mildly atrophied. No evidence of pancreatic mass or pancreatic ductal dilatation. Spleen: Normal in size without focal abnormality. Adrenals/Urinary Tract: Both adrenal glands appear normal. Small renal cysts are noted. There is no suspicious renal finding or hydronephrosis. Stomach/Bowel: No evidence of bowel wall thickening, distention or surrounding inflammatory change. Vascular/Lymphatic: Multiple mildly enlarged lymph nodes within the porta hepatis are again noted. 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 Other: Ascites has decreased in volume. No suspicious peritoneal enhancement. Musculoskeletal: No acute or significant osseous findings. IMPRESSION: 1. 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 Tissue sampling and biliary decompression recommended. 2. Prominent nonspecific lymph nodes in the porta hepatis, likely related to chronic liver disease. No definite distant metastases. 3. Macronodular cirrhosis with multiple siderotic nodules. There is one T1 hyperintense nodule in the left lobe which demonstrates low level enhancement and could reflect a dysplastic nodule. 4. Decreased volume of ascites following paracentesis. Electronically Signed   By: Richardean Sale M.D.   On: 09/15/2015 13:52   US Paracentesis  09/15/2015  Ardis Rowan, PA-C  09/15/2015 11:37 AM Successful US guided paracentesis from LLQ. Yielded 6 liters of clear yellow fluid. No immediate complications. Pt tolerated well. Specimen was sent for labs. WENDY S BLAIR PA-C 09/15/2015 11:37 AM   Mr Abd W/wo Cm/mrcp  09/15/2015  CLINICAL DATA:  Cirrhosis with ascites, distal esophageal varices and lobulated mass in the porta hepatis causing biliary obstruction on CT. EXAM: MRI ABDOMEN WITHOUT AND WITH CONTRAST  (INCLUDING MRCP) TECHNIQUE: Multiplanar multisequence MR imaging of the abdomen was performed both before and after the administration of intravenous contrast. Heavily T2-weighted images of the biliary and pancreatic ducts were obtained, and three-dimensional MRCP images were rendered by post processing. CONTRAST:  58mL MULTIHANCE GADOBENATE DIMEGLUMINE 529 MG/ML IV SOLN COMPARISON:  Abdominal CT 09/14/2015. FINDINGS: Unfortunately, the study is mildly motion degraded due to the patient's inability to completely suspend respiration. Lower chest:  Small left-greater-than-right pleural effusions. Hepatobiliary: The liver contours are diffusely irregular consistent with cirrhosis. There are multiple siderotic nodules throughout the liver with diffuse suppression of hepatic signal on the in phase images consistent with hemosiderosis. There is a 1.6 cm lesion in the medial segment of the left hepatic lobe which demonstrates T1 shortening and low level enhancement following contrast. No other enhancing intrahepatic lesions are identified. There are several hepatic cysts, largest projecting from the dome of the right hepatic lobe, measuring 3.5 cm. As demonstrated on CT, there is a tubular mass within the porta hepatis which appears intra biliary. This demonstrates intermediate T2 signal, low-level enhancement and measures up to 3.8 cm transverse and 6.3 cm in length. There is resulting marked intrahepatic biliary dilatation with the left hepatic duct measuring up to 1.7 cm. The distal common bile duct is normal in caliber. Pancreas: Mildly atrophied. No evidence of pancreatic mass or pancreatic ductal dilatation. Spleen: Normal in size without focal abnormality. Adrenals/Urinary Tract: Both adrenal glands appear normal. Small renal cysts are noted. There is no suspicious renal finding or hydronephrosis. Stomach/Bowel: No evidence of bowel wall thickening, distention or surrounding inflammatory change. Vascular/Lymphatic:  Multiple mildly enlarged lymph nodes within the porta hepatis are again noted. 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 Other: Ascites has decreased in volume. No suspicious peritoneal enhancement. Musculoskeletal: No acute or significant osseous findings. IMPRESSION: 1. 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 Tissue sampling and biliary decompression recommended. 2. Prominent nonspecific lymph nodes in the porta hepatis, likely related to chronic liver disease. No definite distant metastases. 3. Macronodular cirrhosis with multiple siderotic nodules. There is one T1 hyperintense nodule in the left lobe which demonstrates low level enhancement and could reflect a dysplastic nodule. 4. Decreased volume of ascites following paracentesis. Electronically Signed   By: Richardean Sale M.D.   On: 09/15/2015 13:52    CBC  Recent Labs Lab 09/14/15 1628 09/15/15 0411 09/16/15 0430  WBC 6.7 6.1 5.5  HGB 13.6 12.6* 12.8*  HCT 38.9* 35.5* 36.5*  PLT 128* 106* 106*  MCV 103.7* 102.9* 102.5*  MCH 36.3* 36.5* 36.0*  MCHC 35.0 35.5 35.1  RDW 14.4 14.4 14.3    Chemistries   Recent Labs Lab 09/14/15 1628 09/15/15 0411 09/16/15 0430  NA 136 134* 133*  K 4.2 4.2 4.2  CL 102 103 101  CO2 28 26 27   GLUCOSE 98 97 108*  BUN 12 13 12   CREATININE 0.69 0.52* 0.62  CALCIUM 8.7* 8.4* 8.2*  AST 103* 107*  109*  ALT 48 48 50  ALKPHOS 221* 179* 177*  BILITOT 4.0* 5.9* 3.6*   ------------------------------------------------------------------------------------------------------------------ estimated creatinine clearance is 117 mL/min (by C-G formula based on Cr of 0.62). ------------------------------------------------------------------------------------------------------------------ No results for input(s): HGBA1C in the last 72  hours. ------------------------------------------------------------------------------------------------------------------ No results for input(s): CHOL, HDL, LDLCALC, TRIG, CHOLHDL, LDLDIRECT in the last 72 hours. ------------------------------------------------------------------------------------------------------------------ No results for input(s): TSH, T4TOTAL, T3FREE, THYROIDAB in the last 72 hours.  Invalid input(s): FREET3 ------------------------------------------------------------------------------------------------------------------  Recent Labs  09/15/15 1208  VITAMINB12 756  FOLATE 6.8  FERRITIN 1935*  TIBC NOT CALCULATED  IRON 76    Coagulation profile  Recent Labs Lab 09/15/15 1208  INR 1.57*    No results for input(s): DDIMER in the last 72 hours.  Cardiac Enzymes No results for input(s): CKMB, TROPONINI, MYOGLOBIN in the last 168 hours.  Invalid input(s): CK ------------------------------------------------------------------------------------------------------------------ Invalid input(s): POCBNP    Nena Hampe D.O. on 09/16/2015 at 9:50 AM  Between 7am to 7pm - Pager - [phone]  After 7pm go to [email or web address] - password [email or web address]  And look for the night coverage person covering for me after hours  [facility] Office  [phone]

## 2015-09-17 ENCOUNTER — Encounter (HOSPITAL_COMMUNITY): Payer: Self-pay | Admitting: Hematology

## 2015-09-17 DIAGNOSIS — R188 Other ascites: Secondary | ICD-10-CM

## 2015-09-17 DIAGNOSIS — R160 Hepatomegaly, not elsewhere classified: Secondary | ICD-10-CM

## 2015-09-17 DIAGNOSIS — K7460 Unspecified cirrhosis of liver: Secondary | ICD-10-CM

## 2015-09-17 LAB — COMPREHENSIVE METABOLIC PANEL
ALT: 46 U/L (ref 17–63)
ANION GAP: 5 (ref 5–15)
AST: 91 U/L — ABNORMAL HIGH (ref 15–41)
Albumin: 2.1 g/dL — ABNORMAL LOW (ref 3.5–5.0)
Alkaline Phosphatase: 170 U/L — ABNORMAL HIGH (ref 38–126)
BUN: 16 mg/dL (ref 6–20)
CALCIUM: 9 mg/dL (ref 8.9–10.3)
CHLORIDE: 99 mmol/L — AB (ref 101–111)
CO2: 28 mmol/L (ref 22–32)
Creatinine, Ser: 0.68 mg/dL (ref 0.61–1.24)
GFR calc non Af Amer: >60 mL/min (ref >60)
Glucose, Bld: 105 mg/dL — ABNORMAL HIGH (ref 65–99)
Potassium: 4.1 mmol/L (ref 3.5–5.1)
SODIUM: 132 mmol/L — AB (ref 135–145)
Total Bilirubin: 3.6 mg/dL — ABNORMAL HIGH (ref 0.3–1.2)
Total Protein: 6.6 g/dL (ref 6.5–8.1)

## 2015-09-17 LAB — CBC
HCT: 37.6 % — ABNORMAL LOW (ref 39.0–52.0)
Hemoglobin: 13.4 g/dL (ref 13.0–17.0)
MCH: 36.8 pg — AB (ref 26.0–34.0)
MCHC: 35.6 g/dL (ref 30.0–36.0)
MCV: 103.3 fL — ABNORMAL HIGH (ref 78.0–100.0)
PLATELETS: 119 10*3/uL — AB (ref 150–400)
RBC: 3.64 MIL/uL — AB (ref 4.22–5.81)
RDW: 14.2 % (ref 11.5–15.5)
WBC: 7 10*3/uL (ref 4.0–10.5)

## 2015-09-17 LAB — PREPARE FRESH FROZEN PLASMA: Unit division: 0

## 2015-09-17 LAB — CANCER ANTIGEN 19-9: CA 19-9: 153 U/mL — ABNORMAL HIGH (ref 0–35)

## 2015-09-17 LAB — URINE CULTURE: Culture: NO GROWTH

## 2015-09-17 LAB — ANTINUCLEAR ANTIBODIES, IFA: ANTINUCLEAR ANTIBODIES, IFA: NEGATIVE

## 2015-09-17 LAB — MITOCHONDRIAL ANTIBODIES: Mitochondrial M2 Ab, IgG: 88.8 Units — ABNORMAL HIGH (ref 0.0–20.0)

## 2015-09-17 LAB — ANTI-SMOOTH MUSCLE ANTIBODY, IGG: F-ACTIN AB IGG: 27 U — AB (ref 0–19)

## 2015-09-17 MED ORDER — SODIUM CHLORIDE 0.9 % IJ SOLN: 3.0000 mL | INTRAMUSCULAR | Status: DC | PRN

## 2015-09-17 MED ORDER — SODIUM CHLORIDE 0.9 % IJ SOLN
3.0000 mL | Freq: Two times a day (BID) | INTRAMUSCULAR | Status: DC
  Administered 2015-09-17 – 2015-09-18 (×2): 3 mL via INTRAVENOUS

## 2015-09-17 MED ORDER — SODIUM CHLORIDE 0.9 % IV SOLN: 250.0000 mL | INTRAVENOUS | Status: DC | PRN

## 2015-09-17 MED ORDER — HYDROCODONE-ACETAMINOPHEN 5-325 MG PO TABS
1.0000 | ORAL_TABLET | ORAL | Status: DC | PRN
  Administered 2015-09-17 (×2): 2 via ORAL
  Administered 2015-09-17: 1 via ORAL
  Administered 2015-09-17 – 2015-09-18 (×3): 2 via ORAL
  Filled 2015-09-17 (×6): qty 2

## 2015-09-17 MED ORDER — ENSURE ENLIVE PO LIQD
237.0000 mL | Freq: Two times a day (BID) | ORAL | Status: DC
  Administered 2015-09-18 – 2015-09-23 (×9): 237 mL via ORAL

## 2015-09-17 NOTE — Progress Notes (Signed)
Patient ID: Eric Schroeder, male   DOB: 04-24-1956, 59 y.o.   MRN: [id number]        Subjective: Patient with continued epigastric discomfort; also with mild nausea, no vomiting.   Allergies: Review of patient's allergies indicates no known allergies.  Medications: Prior to Admission medications   Medication Sig Start Date End Date Taking? Authorizing Provider  Acetaminophen (TYLENOL PO) Take 2 tablets by mouth every 4 (four) hours as needed (pain).   Yes Historical Provider, MD     Vital Signs: BP 132/76 mmHg  Pulse 88  Temp(Src) 98.6 F (37 C) (Oral)  Resp 18  Ht 5' 9.5" (1.765 m)  Wt 186 lb 4.6 oz (84.5 kg)  BMI 27.12 kg/m2  SpO2 100%  Physical Exam awake, alert. Left biliary drain catheter intact, insertion site okay, mild to moderately tender to palpation; output 2.5 L; bile cytology/cultures pending; abdomen soft, mildly distended.  Imaging: Dg Chest 2 View  09/14/2015  CLINICAL DATA:  Bilateral ankle swelling. Abdominal distention. Back pain. Shallow breathing. EXAM: CHEST  2 VIEW COMPARISON:  None. FINDINGS: Poor inspiration. Normal sized heart. Minimal left lateral pleural thickening. No pleural fluid is seen posteriorly on either side. Small amount of linear density at the left lung base. Mild thoracic spine degenerative changes. IMPRESSION: Poor inspiration with a small amount of pleural and parenchymal scarring at the left lung base. Electronically Signed   By: Claudie Revering M.D.   On: 09/14/2015 19:20   Ct Chest W Contrast  09/15/2015  CLINICAL DATA:  Liver mass diagnosed on CT 1 day prior. Status post paracentesis. Fatigue. Inpatient. EXAM: CT CHEST WITH CONTRAST TECHNIQUE: Multidetector CT imaging of the chest was performed during intravenous contrast administration. CONTRAST:  24mL OMNIPAQUE IOHEXOL 300 MG/ML  SOLN COMPARISON:  09/14/2015 CT abdomen/ pelvis. Chest radiograph from 1 day prior. FINDINGS: Mediastinum/Nodes: Normal heart size. No pericardial  fluid/thickening. Left anterior descending and left circumflex coronary atherosclerosis. Great vessels are normal in course and caliber. No central pulmonary emboli. Normal visualized thyroid. Large lower esophageal varices. No pathologically enlarged axillary, mediastinal or hilar lymph nodes. Lungs/Pleura: No pneumothorax. Trace left pleural effusion. Subsegmental atelectasis in both lower lobes. No acute consolidative airspace disease, significant pulmonary nodules or lung masses. Upper abdomen: Re- demonstrated is cirrhosis and diffuse severe intrahepatic biliary ductal dilatation, with re- demonstration of central liver 4.7 cm mass extending superiorly and inferiorly along the central bile ducts. There are stable subcentimeter hypodense lesions in the left liver lobe and simple cysts in the right liver lobe. Small volume upper abdominal ascites, decreased. Fat stranding throughout the upper abdominal mesenteric and omental fat, unchanged. Stable dilated visualized proximal common bile duct. Stable mild upper retroperitoneal adenopathy. Musculoskeletal: Stable nondisplaced healing subacute lateral left eighth rib fracture. Mild-to-moderate degenerative changes in thoracic spine. IMPRESSION: 1. No evidence of metastatic disease in the chest. 2. Trace left pleural effusion. Mild bibasilar atelectasis. No acute consolidative airspace disease. 3. Three-vessel coronary atherosclerosis. 4. Stable nondisplaced healing subacute lateral left eighth rib fracture. 5. Re- demonstration of cirrhosis, central liver mass, severe diffuse intrahepatic biliary ductal dilatation, indeterminate subcentimeter hypodense left liver lobe lesions. Small volume upper abdominal ascites, decreased. Electronically Signed   By: Ilona Sorrel M.D.   On: 09/15/2015 13:47   Ct Abdomen Pelvis W Contrast  09/14/2015  CLINICAL DATA:  Abdominal distention. Back pain. Lower extremity swelling. EXAM: CT ABDOMEN AND PELVIS WITH CONTRAST TECHNIQUE:  Multidetector CT imaging of the abdomen and pelvis was performed using the standard  protocol following bolus administration of intravenous contrast. CONTRAST:  113mL OMNIPAQUE IOHEXOL 300 MG/ML  SOLN COMPARISON:  None. FINDINGS: Lower chest: No significant pulmonary nodules or acute consolidative airspace disease. Subsegmental atelectasis in the basilar lower lobes. Hepatobiliary: The liver surface is diffusely prominently irregular, and there is relative hypertrophy of the left liver lobe, in keeping with cirrhosis. There is severe diffuse intrahepatic biliary ductal dilatation. There is a lobulated 4.8 x 3.5 cm mass in the porta hepatis (series 2/ image 25), which demonstrates hypoenhancement relative to the liver parenchyma, and which demonstrates finger-like extensions into the right liver lobe along the biliary tree. 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 Simple appearing 1.9 cm liver cyst in the posterior right liver lobe. Separate simple appearing 3.4 cm liver cyst in the posterior upper right liver lobe. There are at least 2 additional subcentimeter hypodense lesions in the left liver lobe, too small to characterize. Nondistended gallbladder with nonspecific mild diffuse gallbladder wall thickening. The common bile duct is dilated up to 14 mm in diameter proximally with smooth distal tapering. Pancreas: Normal, with no mass or duct dilation. Spleen: Normal size. No mass. Adrenals/Urinary Tract: Normal adrenals. Normal kidneys with no hydronephrosis and no renal mass. Normal bladder. Stomach/Bowel: Grossly normal stomach. Normal caliber small bowel with no small bowel wall thickening. Normal appendix. Normal large bowel with no diverticulosis, large bowel wall thickening or pericolonic fat stranding. Vascular/Lymphatic: Normal caliber abdominal aorta. Patent portal, splenic and renal veins. There are large esophageal varices. There are small paraumbilical varices. There  are additional portosystemic varices in the left omentum, which appear to drain into the left pelvic deep veins. There are multiple mildly enlarged porta hepatis nodes, largest 1.4 cm (series 2/ image 30). Reproductive: Normal size prostate with nonspecific internal prostatic calcifications. Other: No pneumoperitoneum. Moderate to large volume ascites, which measures simple fluid density. Nonspecific fat stranding throughout the greater omentum and mesentery. Musculoskeletal: No aggressive appearing focal osseous lesions. Subacute healing lateral left eighth rib fracture. Mild-to-moderate degenerative changes in the visualized thoracolumbar spine. IMPRESSION: 1. Cirrhosis. Lobulated 4.8 x 3.5 cm solid mass in the porta hepatis with finger-like extensions into the right liver lobe along the biliary tree. Severe diffuse intrahepatic biliary ductal dilatation. 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 MRI abdomen with and without intravenous contrast could be performed after paracentesis for further evaluation. Recommend GI consultation for ERCP and possible biliary decompression, which may require IR consultation for percutaneous biliary decompression. 2. Large volume ascites. 3. Large esophageal varices. Small paraumbilical varices. Additional portosystemic left omental varices. 4. Nonspecific mild porta hepatis lymphadenopathy. 5. Separate subcentimeter hypodense liver lesions are too small to characterize and could represent liver metastases. 6. Subacute lateral left eighth rib fracture. These results were called by telephone at the time of interpretation on 09/14/2015 at 7:38 pm to Dr. Lonia Skinner , who verbally acknowledged these results. Electronically Signed   By: Ilona Sorrel M.D.   On: 09/14/2015 19:40   Mr 3d Recon At Scanner  09/15/2015  CLINICAL DATA:  Cirrhosis with ascites, distal esophageal varices  and lobulated mass in the porta hepatis causing biliary obstruction on CT. EXAM: MRI ABDOMEN WITHOUT AND WITH CONTRAST (INCLUDING MRCP) TECHNIQUE: Multiplanar multisequence MR imaging of the abdomen was performed both before and after the administration of intravenous contrast. Heavily T2-weighted images of the biliary and pancreatic ducts were obtained, and three-dimensional MRCP images were rendered by  post processing. CONTRAST:  86mL MULTIHANCE GADOBENATE DIMEGLUMINE 529 MG/ML IV SOLN COMPARISON:  Abdominal CT 09/14/2015. FINDINGS: Unfortunately, the study is mildly motion degraded due to the patient's inability to completely suspend respiration. Lower chest:  Small left-greater-than-right pleural effusions. Hepatobiliary: The liver contours are diffusely irregular consistent with cirrhosis. There are multiple siderotic nodules throughout the liver with diffuse suppression of hepatic signal on the in phase images consistent with hemosiderosis. There is a 1.6 cm lesion in the medial segment of the left hepatic lobe which demonstrates T1 shortening and low level enhancement following contrast. No other enhancing intrahepatic lesions are identified. There are several hepatic cysts, largest projecting from the dome of the right hepatic lobe, measuring 3.5 cm. As demonstrated on CT, there is a tubular mass within the porta hepatis which appears intra biliary. This demonstrates intermediate T2 signal, low-level enhancement and measures up to 3.8 cm transverse and 6.3 cm in length. There is resulting marked intrahepatic biliary dilatation with the left hepatic duct measuring up to 1.7 cm. The distal common bile duct is normal in caliber. Pancreas: Mildly atrophied. No evidence of pancreatic mass or pancreatic ductal dilatation. Spleen: Normal in size without focal abnormality. Adrenals/Urinary Tract: Both adrenal glands appear normal. Small renal cysts are noted. There is no suspicious renal finding or hydronephrosis.  Stomach/Bowel: No evidence of bowel wall thickening, distention or surrounding inflammatory change. Vascular/Lymphatic: Multiple mildly enlarged lymph nodes within the porta hepatis are again noted. 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 Other: Ascites has decreased in volume. No suspicious peritoneal enhancement. Musculoskeletal: No acute or significant osseous findings. IMPRESSION: 1. 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 Tissue sampling and biliary decompression recommended. 2. Prominent nonspecific lymph nodes in the porta hepatis, likely related to chronic liver disease. No definite distant metastases. 3. Macronodular cirrhosis with multiple siderotic nodules. There is one T1 hyperintense nodule in the left lobe which demonstrates low level enhancement and could reflect a dysplastic nodule. 4. Decreased volume of ascites following paracentesis. Electronically Signed   By: Richardean Sale M.D.   On: 09/15/2015 13:52   US Paracentesis  09/15/2015  Ardis Rowan, PA-C     09/15/2015 11:37 AM Successful US guided paracentesis from LLQ. Yielded 6 liters of clear yellow fluid. No immediate complications. Pt tolerated well. Specimen was sent for labs. WENDY S BLAIR PA-C 09/15/2015 11:37 AM   Mr Abd W/wo Cm/mrcp  09/15/2015  CLINICAL DATA:  Cirrhosis with ascites, distal esophageal varices and lobulated mass in the porta hepatis causing biliary obstruction on CT. EXAM: MRI ABDOMEN WITHOUT AND WITH CONTRAST (INCLUDING MRCP) TECHNIQUE: Multiplanar multisequence MR imaging of the abdomen was performed both before and after the administration of intravenous contrast. Heavily T2-weighted images of the biliary and pancreatic ducts were obtained, and three-dimensional MRCP images were rendered by post processing. CONTRAST:  39mL MULTIHANCE  GADOBENATE DIMEGLUMINE 529 MG/ML IV SOLN COMPARISON:  Abdominal CT 09/14/2015. FINDINGS: Unfortunately, the study is mildly motion degraded due to the patient's inability to completely suspend respiration. Lower chest:  Small left-greater-than-right pleural effusions. Hepatobiliary: The liver contours are diffusely irregular consistent with cirrhosis. There are multiple siderotic nodules throughout the liver with diffuse suppression of hepatic signal on the in phase images consistent with hemosiderosis. There is a 1.6 cm lesion in the medial segment of the left hepatic lobe which demonstrates T1 shortening and low level enhancement following contrast.  No other enhancing intrahepatic lesions are identified. There are several hepatic cysts, largest projecting from the dome of the right hepatic lobe, measuring 3.5 cm. As demonstrated on CT, there is a tubular mass within the porta hepatis which appears intra biliary. This demonstrates intermediate T2 signal, low-level enhancement and measures up to 3.8 cm transverse and 6.3 cm in length. There is resulting marked intrahepatic biliary dilatation with the left hepatic duct measuring up to 1.7 cm. The distal common bile duct is normal in caliber. Pancreas: Mildly atrophied. No evidence of pancreatic mass or pancreatic ductal dilatation. Spleen: Normal in size without focal abnormality. Adrenals/Urinary Tract: Both adrenal glands appear normal. Small renal cysts are noted. There is no suspicious renal finding or hydronephrosis. Stomach/Bowel: No evidence of bowel wall thickening, distention or surrounding inflammatory change. Vascular/Lymphatic: Multiple mildly enlarged lymph nodes within the porta hepatis are again noted. 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 Other: Ascites has decreased in volume. No suspicious peritoneal enhancement. Musculoskeletal: No acute or significant osseous findings.  IMPRESSION: 1. 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 Tissue sampling and biliary decompression recommended. 2. Prominent nonspecific lymph nodes in the porta hepatis, likely related to chronic liver disease. No definite distant metastases. 3. Macronodular cirrhosis with multiple siderotic nodules. There is one T1 hyperintense nodule in the left lobe which demonstrates low level enhancement and could reflect a dysplastic nodule. 4. Decreased volume of ascites following paracentesis. Electronically Signed   By: Richardean Sale M.D.   On: 09/15/2015 13:52   Ir Int Lianne Cure Biliary Drain With Cholangiogram  09/17/2015  CLINICAL DATA:  Biliary obstruction EXAM: IR INT-EXT BILIARY DRAIN W/ CHOLANGIOGRAM; IR BALLOON DILATION OF BILIARY DUCT/AMPULLA FLUOROSCOPY TIME:  9 minutes and 6 seconds MEDICATIONS AND MEDICAL HISTORY: Versed 4 mg, Fentanyl 100 mcg. Additional Medications: None.  Patient was on Rocephin. ANESTHESIA/SEDATION: Moderate sedation time: 60 minutes CONTRAST:  15 cc Omnipaque 300 PROCEDURE: The procedure, risks, benefits, and alternatives were explained to the patient. Questions regarding the procedure were encouraged and answered. The patient understands and consents to the procedure. The upper abdominal region was prepped with Betadine in a sterile fashion, and a sterile drape was applied covering the operative field. A sterile gown and sterile gloves were used for the procedure. Under sonographic guidance, a Chiba needle was inserted into a left hepatic duct. Contrast was injected opacifying the biliary tree. The needle was removed over a 018 wire. The Accustick transitional dilator was advanced over the wire to the mid biliary tree. It could not be advanced into the central biliary tree secondary to a very resistant partial obstruction. An Amplatz wire was advanced through the transitional dilator. This  was advanced into the central biliary tree. Again, the 6 [place] transitional dilator of the Accustick set could not be advanced across the Amplatz wire. A 4 French glide catheter was advanced across the Amplatz wire into the central biliary tree. The glide catheter was advanced over a Bentson wire into the duodenum then was again exchange for the Amplatz wire. Initial attempts at dilating across the partial obstruction with serial dilators starting from 5 [place] were unsuccessful. A 5 French sheath was advanced over the Amplatz into the peripheral biliary tree a 4 French balloon was utilized to dilate the partial obstruction. The tract was then easily dilated to 8 [place] an 8 [place] biliary drain was then advanced over the Amplatz  into the duodenum. Two additional sideholes were cut above the upper marker. It was looped and string fixed in the duodenum then sewn to the skin. The initial aspirate was sent for a cytology analysis. Contrast was injected. FINDINGS: Percutaneous cholangiography into the left the biliary tree demonstrates very limited opacification the the bile ducts. This was performed in an attempt to prevent sepsis. Final images demonstrate left internal external biliary drain placement with its tip coiled in the duodenum. Central left-sided ducts only were opacified by contrast. COMPLICATIONS: None IMPRESSION: Successful left internal external biliary drain placement. An 8 French device was placed secondary to a resistant biliary obstruction. Initial bile aspirate was sent for cytology. Electronically Signed   By: Marybelle Killings M.D.   On: 09/17/2015 08:33   Ir Balloon Dilation Of Biliary Ducts/ampulla  09/17/2015  CLINICAL DATA:  Biliary obstruction EXAM: IR INT-EXT BILIARY DRAIN W/ CHOLANGIOGRAM; IR BALLOON DILATION OF BILIARY DUCT/AMPULLA FLUOROSCOPY TIME:  9 minutes and 6 seconds MEDICATIONS AND MEDICAL HISTORY: Versed 4 mg, Fentanyl 100 mcg. Additional Medications: None.  Patient was on  Rocephin. ANESTHESIA/SEDATION: Moderate sedation time: 60 minutes CONTRAST:  15 cc Omnipaque 300 PROCEDURE: The procedure, risks, benefits, and alternatives were explained to the patient. Questions regarding the procedure were encouraged and answered. The patient understands and consents to the procedure. The upper abdominal region was prepped with Betadine in a sterile fashion, and a sterile drape was applied covering the operative field. A sterile gown and sterile gloves were used for the procedure. Under sonographic guidance, a Chiba needle was inserted into a left hepatic duct. Contrast was injected opacifying the biliary tree. The needle was removed over a 018 wire. The Accustick transitional dilator was advanced over the wire to the mid biliary tree. It could not be advanced into the central biliary tree secondary to a very resistant partial obstruction. An Amplatz wire was advanced through the transitional dilator. This was advanced into the central biliary tree. Again, the 6 [place] transitional dilator of the Accustick set could not be advanced across the Amplatz wire. A 4 French glide catheter was advanced across the Amplatz wire into the central biliary tree. The glide catheter was advanced over a Bentson wire into the duodenum then was again exchange for the Amplatz wire. Initial attempts at dilating across the partial obstruction with serial dilators starting from 5 [place] were unsuccessful. A 5 French sheath was advanced over the Amplatz into the peripheral biliary tree a 4 French balloon was utilized to dilate the partial obstruction. The tract was then easily dilated to 8 [place] an 8 [place] biliary drain was then advanced over the Amplatz into the duodenum. Two additional sideholes were cut above the upper marker. It was looped and string fixed in the duodenum then sewn to the skin. The initial aspirate was sent for a cytology analysis. Contrast was injected. FINDINGS: Percutaneous cholangiography  into the left the biliary tree demonstrates very limited opacification the the bile ducts. This was performed in an attempt to prevent sepsis. Final images demonstrate left internal external biliary drain placement with its tip coiled in the duodenum. Central left-sided ducts only were opacified by contrast. COMPLICATIONS: None IMPRESSION: Successful left internal external biliary drain placement. An 8 French device was placed secondary to a resistant biliary obstruction. Initial bile aspirate was sent for cytology. Electronically Signed   By: Marybelle Killings M.D.   On: 09/17/2015 08:33    Labs:  CBC:  Recent Labs  09/14/15 1628 09/15/15 0411 09/16/15 0430  09/17/15 0418  WBC 6.7 6.1 5.5 7.0  HGB 13.6 12.6* 12.8* 13.4  HCT 38.9* 35.5* 36.5* 37.6*  PLT 128* 106* 106* 119*    COAGS:  Recent Labs  09/15/15 1208  INR 1.57*  APTT 38*    BMP:  Recent Labs  09/14/15 1628 09/15/15 0411 09/16/15 0430 09/17/15 0418  NA 136 134* 133* 132*  K 4.2 4.2 4.2 4.1  CL 102 103 101 99*  CO2 $Re'28 26 27 28  'gFu$ GLUCOSE 98 97 108* 105*  BUN $Re'12 13 12 16  'lSt$ CALCIUM 8.7* 8.4* 8.2* 9.0  CREATININE 0.69 0.52* 0.62 0.68  GFRNONAA >60 >60 >60 >60  GFRAA >60 >60 >60 >60    LIVER FUNCTION TESTS:  Recent Labs  09/14/15 1628 09/15/15 0411 09/16/15 0430 09/17/15 0418  BILITOT 4.0* 5.9* 3.6* 3.6*  AST 103* 107* 109* 91*  ALT 48 48 50 46  ALKPHOS 221* 179* 177* 170*  PROT 7.4 6.2* 6.1* 6.6  ALBUMIN 2.3* 2.0* 1.8* 2.1*    Assessment and Plan: Patient with history of cirrhosis, ascites, 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 Check follow-up bile cultures/cytology. Patient afebrile, total bilirubin 3.6(3.6), WBC 7.0, hemoglobin 13.4, platelets 119k. Continue drain irrigation; monitor labs closely; depending upon bile fluid cytology, may need bile duct brushing later this week.   Signed: D. Rowe Robert 09/17/2015, 5:05 PM   I spent a total of 15 minutes at the the patient's bedside AND on the patient's hospital floor or unit, greater than 50% of which was counseling/coordinating care for biliary drain

## 2015-09-17 NOTE — Progress Notes (Signed)
Pt had a total of about 2350 cc throughout the shift from 7pm- 0630 a.m. Md was notified . PT states he feel good and is drinking good. Will continue to monitor.

## 2015-09-17 NOTE — Progress Notes (Signed)
Initial Nutrition Assessment  DOCUMENTATION CODES:   Non-severe (moderate) malnutrition in context of chronic illness  INTERVENTION:   Recommend new weight be obtained (s/p paracentesis) Provide Ensure Enlive po BID, each supplement provides 350 kcal and 20 grams of protein Encourage PO intake RD to continue to monitor  NUTRITION DIAGNOSIS:   Malnutrition related to chronic illness as evidenced by severe depletion of body fat, moderate depletions of muscle mass.  GOAL:   Patient will meet greater than or equal to 90% of their needs  MONITOR:   PO intake, Supplement acceptance, Labs, Weight trends, Skin, I & O's  REASON FOR ASSESSMENT:   Malnutrition Screening Tool    ASSESSMENT:   59 y.o. male with no significant past medical history who comes to the emergency department with complaints of abdominal distention, abdominal pain, bilateral flank pain, lower extremity edema for about 2 weeks. This was preceded by progressively worse bloating, early satiety for most of this year. He also endorses a 35 pound weight loss during this timeframe as well. He states that he adjusted his eating habits to this, did not think that there was any major health issue on until his abdominal distention and lower extremity edema.  12/4: s/p paracentesis, yield: 6L  Patient in room with wife, he c/o the food not tasting good. Pt is eating adequately regardless of liking the food. Encouraged small, frequent meals as he states he cannot eat much at one time. Pt with yogurt and fruit cup sitting at bedside. Pt would like pudding for a snack, encouraged him to order some pudding later today. Pt willing to try Ensure supplements for additional protein intake, RD to order.  Patient reports changing his diet over the last year by not drinking sweet tea anymore and now he bikes ~10 miles a day.   Pt reports 35 lb weight loss over the past year but he says he has gained fluid weight. He would like to know his  weight s/p paracentesis.  Nutrition-Focused physical exam completed. Findings are severe fat depletion, moderate muscle depletion, and no edema currently.   Labs reviewed: Low Na  Diet Order:  Diet Heart Room service appropriate?: Yes; Fluid consistency:: Thin  Skin:  Reviewed, no issues  Last BM:  12/5  Height:   Ht Readings from Last 1 Encounters:  09/14/15 5' 9.5" (1.765 m)    Weight:   Wt Readings from Last 1 Encounters:  09/14/15 215 lb 2.7 oz (97.6 kg)    Ideal Body Weight:  75.5 kg  BMI:  Body mass index is 31.33 kg/(m^2).  Estimated Nutritional Needs:   Kcal:  2100-2300  Protein:  110-120g  Fluid:  1.8L/day  EDUCATION NEEDS:   Education needs addressed  Clayton Bibles, MS, RD, LDN Pager: [phone] After Hours Pager: [phone]

## 2015-09-17 NOTE — Progress Notes (Signed)
Patient ID: Eric Schroeder, male   DOB: [birth year], 59 y.o.   MRN: [id number]    Progress Note   Subjective   Feels fine, tolerated PTC without difficulty. Asking for pain med this am-"just uncomfortable" with movement at drain site Eating fine, no N/V CA19-9= 153 Cytology pending from biliary fluid, and ascitic fluid   Objective   Vital signs in last 24 hours: Temp:  [97.6 F (36.4 C)-98.6 F (37 C)] 98.6 F (37 C) (12/06 0547) Pulse Rate:  [69-120] 88 (12/06 0547) Resp:  [15-22] 18 (12/06 0547) BP: (96-132)/(59-84) 132/76 mmHg (12/06 0547) SpO2:  [97 %-100 %] 100 % (12/06 0547) Last BM Date: 09/16/15 General:    white male in NAD sclera icteric Heart:  Regular rate and rhythm; no murmurs Lungs: Respirations even and unlabored, lungs CTA bilaterally Abdomen:  Soft, mild tenderness upper abdomen and mildly distended. Normal bowel sounds, non tense ascites. Biliary drain with 300+ out past shift. Extremities:  Without edema. Neurologic:  Alert and oriented,  grossly normal neurologically. Psych:  Cooperative. Normal mood and affect.  Intake/Output from previous day: 12/05 0701 - 12/06 0700 In: 511.6 [P.O.:120; I.V.:90; Blood:301.6] Out: 3052 [Urine:2; Drains:3050] Intake/Output this shift: Total I/O In: -  Out: 250 [Drains:250]  Lab Results:  Recent Labs  09/15/15 0411 09/16/15 0430 09/17/15 0418  WBC 6.1 5.5 7.0  HGB 12.6* 12.8* 13.4  HCT 35.5* 36.5* 37.6*  PLT 106* 106* 119*   BMET  Recent Labs  09/15/15 0411 09/16/15 0430 09/17/15 0418  NA 134* 133* 132*  K 4.2 4.2 4.1  CL 103 101 99*  CO2 _0 GLUCOSE 97 108* 105*  BUN _1 CREATININE 0.52* 0.62 0.68  CALCIUM 8.4* 8.2* 9.0   LFT  Recent Labs  09/17/15 0418  PROT 6.6  ALBUMIN 2.1*  AST 91*  ALT 46  ALKPHOS 170*  BILITOT 3.6*   PT/INR  Recent Labs  09/15/15 1208  LABPROT 18.8*  INR 1.57*    Studies/Results: Ct Chest W Contrast  09/15/2015  CLINICAL DATA:  Liver mass  diagnosed on CT 1 day prior. Status post paracentesis. Fatigue. Inpatient. EXAM: CT CHEST WITH CONTRAST TECHNIQUE: Multidetector CT imaging of the chest was performed during intravenous contrast administration. CONTRAST:  41m OMNIPAQUE IOHEXOL 300 MG/ML  SOLN COMPARISON:  09/14/2015 CT abdomen/ pelvis. Chest radiograph from 1 day prior. FINDINGS: Mediastinum/Nodes: Normal heart size. No pericardial fluid/thickening. Left anterior descending and left circumflex coronary atherosclerosis. Great vessels are normal in course and caliber. No central pulmonary emboli. Normal visualized thyroid. Large lower esophageal varices. No pathologically enlarged axillary, mediastinal or hilar lymph nodes. Lungs/Pleura: No pneumothorax. Trace left pleural effusion. Subsegmental atelectasis in both lower lobes. No acute consolidative airspace disease, significant pulmonary nodules or lung masses. Upper abdomen: Re- demonstrated is cirrhosis and diffuse severe intrahepatic biliary ductal dilatation, with re- demonstration of central liver 4.7 cm mass extending superiorly and inferiorly along the central bile ducts. There are stable subcentimeter hypodense lesions in the left liver lobe and simple cysts in the right liver lobe. Small volume upper abdominal ascites, decreased. Fat stranding throughout the upper abdominal mesenteric and omental fat, unchanged. Stable dilated visualized proximal common bile duct. Stable mild upper retroperitoneal adenopathy. Musculoskeletal: Stable nondisplaced healing subacute lateral left eighth rib fracture. Mild-to-moderate degenerative changes in thoracic spine. IMPRESSION: 1. No evidence of metastatic disease in the chest. 2. Trace left pleural effusion. Mild bibasilar atelectasis. No acute consolidative airspace disease. 3. Three-vessel coronary atherosclerosis.  4. Stable nondisplaced healing subacute lateral left eighth rib fracture. 5. Re- demonstration of cirrhosis, central liver mass, severe  diffuse intrahepatic biliary ductal dilatation, indeterminate subcentimeter hypodense left liver lobe lesions. Small volume upper abdominal ascites, decreased. Electronically Signed   By: Ilona Sorrel M.D.   On: 09/15/2015 13:47   Mr 3d Recon At Scanner  09/15/2015  CLINICAL DATA:  Cirrhosis with ascites, distal esophageal varices and lobulated mass in the porta hepatis causing biliary obstruction on CT. EXAM: MRI ABDOMEN WITHOUT AND WITH CONTRAST (INCLUDING MRCP) TECHNIQUE: Multiplanar multisequence MR imaging of the abdomen was performed both before and after the administration of intravenous contrast. Heavily T2-weighted images of the biliary and pancreatic ducts were obtained, and three-dimensional MRCP images were rendered by post processing. CONTRAST:  85m MULTIHANCE GADOBENATE DIMEGLUMINE 529 MG/ML IV SOLN COMPARISON:  Abdominal CT 09/14/2015. FINDINGS: Unfortunately, the study is mildly motion degraded due to the patient's inability to completely suspend respiration. Lower chest:  Small left-greater-than-right pleural effusions. Hepatobiliary: The liver contours are diffusely irregular consistent with cirrhosis. There are multiple siderotic nodules throughout the liver with diffuse suppression of hepatic signal on the in phase images consistent with hemosiderosis. There is a 1.6 cm lesion in the medial segment of the left hepatic lobe which demonstrates T1 shortening and low level enhancement following contrast. No other enhancing intrahepatic lesions are identified. There are several hepatic cysts, largest projecting from the dome of the right hepatic lobe, measuring 3.5 cm. As demonstrated on CT, there is a tubular mass within the porta hepatis which appears intra biliary. This demonstrates intermediate T2 signal, low-level enhancement and measures up to 3.8 cm transverse and 6.3 cm in length. There is resulting marked intrahepatic biliary dilatation with the left hepatic duct measuring up to 1.7 cm.  The distal common bile duct is normal in caliber. Pancreas: Mildly atrophied. No evidence of pancreatic mass or pancreatic ductal dilatation. Spleen: Normal in size without focal abnormality. Adrenals/Urinary Tract: Both adrenal glands appear normal. Small renal cysts are noted. There is no suspicious renal finding or hydronephrosis. Stomach/Bowel: No evidence of bowel wall thickening, distention or surrounding inflammatory change. Vascular/Lymphatic: Multiple mildly enlarged lymph nodes within the porta hepatis are again noted. 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 Other: Ascites has decreased in volume. No suspicious peritoneal enhancement. Musculoskeletal: No acute or significant osseous findings. IMPRESSION: 1. 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 Tissue sampling and biliary decompression recommended. 2. Prominent nonspecific lymph nodes in the porta hepatis, likely related to chronic liver disease. No definite distant metastases. 3. Macronodular cirrhosis with multiple siderotic nodules. There is one T1 hyperintense nodule in the left lobe which demonstrates low level enhancement and could reflect a dysplastic nodule. 4. Decreased volume of ascites following paracentesis. Electronically Signed   By: WRichardean SaleM.D.   On: 09/15/2015 13:52   [place]  09/15/2015  WArdis Rowan PA-C     09/15/2015 11:37 AM Successful [place] paracentesis from LLQ. Yielded 6 liters of clear yellow fluid. No immediate complications. Pt tolerated well. Specimen was sent for labs. WENDY S BLAIR PA-C 09/15/2015 11:37 AM   Mr Abd W/wo Cm/mrcp  09/15/2015  CLINICAL DATA:  Cirrhosis with ascites, distal esophageal varices and lobulated mass in the porta hepatis causing biliary obstruction on CT. EXAM: MRI ABDOMEN WITHOUT AND WITH CONTRAST  (INCLUDING MRCP) TECHNIQUE: Multiplanar multisequence MR imaging of  the abdomen was performed both before and after the administration of intravenous contrast. Heavily T2-weighted images of the biliary and pancreatic ducts were obtained, and three-dimensional MRCP images were rendered by post processing. CONTRAST:  6mL MULTIHANCE GADOBENATE DIMEGLUMINE 529 MG/ML IV SOLN COMPARISON:  Abdominal CT 09/14/2015. FINDINGS: Unfortunately, the study is mildly motion degraded due to the patient's inability to completely suspend respiration. Lower chest:  Small left-greater-than-right pleural effusions. Hepatobiliary: The liver contours are diffusely irregular consistent with cirrhosis. There are multiple siderotic nodules throughout the liver with diffuse suppression of hepatic signal on the in phase images consistent with hemosiderosis. There is a 1.6 cm lesion in the medial segment of the left hepatic lobe which demonstrates T1 shortening and low level enhancement following contrast. No other enhancing intrahepatic lesions are identified. There are several hepatic cysts, largest projecting from the dome of the right hepatic lobe, measuring 3.5 cm. As demonstrated on CT, there is a tubular mass within the porta hepatis which appears intra biliary. This demonstrates intermediate T2 signal, low-level enhancement and measures up to 3.8 cm transverse and 6.3 cm in length. There is resulting marked intrahepatic biliary dilatation with the left hepatic duct measuring up to 1.7 cm. The distal common bile duct is normal in caliber. Pancreas: Mildly atrophied. No evidence of pancreatic mass or pancreatic ductal dilatation. Spleen: Normal in size without focal abnormality. Adrenals/Urinary Tract: Both adrenal glands appear normal. Small renal cysts are noted. There is no suspicious renal finding or hydronephrosis. Stomach/Bowel: No evidence of bowel wall thickening, distention or surrounding inflammatory change. Vascular/Lymphatic:  Multiple mildly enlarged lymph nodes within the porta hepatis are again noted. 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 Other: Ascites has decreased in volume. No suspicious peritoneal enhancement. Musculoskeletal: No acute or significant osseous findings. IMPRESSION: 1. 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 Tissue sampling and biliary decompression recommended. 2. Prominent nonspecific lymph nodes in the porta hepatis, likely related to chronic liver disease. No definite distant metastases. 3. Macronodular cirrhosis with multiple siderotic nodules. There is one T1 hyperintense nodule in the left lobe which demonstrates low level enhancement and could reflect a dysplastic nodule. 4. Decreased volume of ascites following paracentesis. Electronically Signed   By: Richardean Sale M.D.   On: 09/15/2015 13:52   Ir Int Lianne Cure Biliary Drain With Cholangiogram  09/17/2015  CLINICAL DATA:  Biliary obstruction EXAM: IR INT-EXT BILIARY DRAIN W/ CHOLANGIOGRAM; IR BALLOON DILATION OF BILIARY DUCT/AMPULLA FLUOROSCOPY TIME:  9 minutes and 6 seconds MEDICATIONS AND MEDICAL HISTORY: Versed 4 mg, Fentanyl 100 mcg. Additional Medications: None.  Patient was on Rocephin. ANESTHESIA/SEDATION: Moderate sedation time: 60 minutes CONTRAST:  15 cc Omnipaque 300 PROCEDURE: The procedure, risks, benefits, and alternatives were explained to the patient. Questions regarding the procedure were encouraged and answered. The patient understands and consents to the procedure. The upper abdominal region was prepped with Betadine in a sterile fashion, and a sterile drape was applied covering the operative field. A sterile gown and sterile gloves were used for the procedure. Under sonographic guidance, a Chiba needle was inserted into a left hepatic duct. Contrast was injected  opacifying the biliary tree. The needle was removed over a 018 wire. The Accustick transitional dilator was advanced over the wire to the mid biliary tree. It could not be advanced into the central biliary tree secondary to a very resistant partial obstruction. An Amplatz wire was  advanced through the transitional dilator. This was advanced into the central biliary tree. Again, the 6 [place] transitional dilator of the Accustick set could not be advanced across the Amplatz wire. A 4 French glide catheter was advanced across the Amplatz wire into the central biliary tree. The glide catheter was advanced over a Bentson wire into the duodenum then was again exchange for the Amplatz wire. Initial attempts at dilating across the partial obstruction with serial dilators starting from 5 [place] were unsuccessful. A 5 French sheath was advanced over the Amplatz into the peripheral biliary tree a 4 French balloon was utilized to dilate the partial obstruction. The tract was then easily dilated to 8 [place] an 8 [place] biliary drain was then advanced over the Amplatz into the duodenum. Two additional sideholes were cut above the upper marker. It was looped and string fixed in the duodenum then sewn to the skin. The initial aspirate was sent for a cytology analysis. Contrast was injected. FINDINGS: Percutaneous cholangiography into the left the biliary tree demonstrates very limited opacification the the bile ducts. This was performed in an attempt to prevent sepsis. Final images demonstrate left internal external biliary drain placement with its tip coiled in the duodenum. Central left-sided ducts only were opacified by contrast. COMPLICATIONS: None IMPRESSION: Successful left internal external biliary drain placement. An 8 French device was placed secondary to a resistant biliary obstruction. Initial bile aspirate was sent for cytology. Electronically Signed   By: Marybelle Killings M.D.   On: 09/17/2015 08:33   Ir Balloon Dilation  Of Biliary Ducts/ampulla  09/17/2015  CLINICAL DATA:  Biliary obstruction EXAM: IR INT-EXT BILIARY DRAIN W/ CHOLANGIOGRAM; IR BALLOON DILATION OF BILIARY DUCT/AMPULLA FLUOROSCOPY TIME:  9 minutes and 6 seconds MEDICATIONS AND MEDICAL HISTORY: Versed 4 mg, Fentanyl 100 mcg. Additional Medications: None.  Patient was on Rocephin. ANESTHESIA/SEDATION: Moderate sedation time: 60 minutes CONTRAST:  15 cc Omnipaque 300 PROCEDURE: The procedure, risks, benefits, and alternatives were explained to the patient. Questions regarding the procedure were encouraged and answered. The patient understands and consents to the procedure. The upper abdominal region was prepped with Betadine in a sterile fashion, and a sterile drape was applied covering the operative field. A sterile gown and sterile gloves were used for the procedure. Under sonographic guidance, a Chiba needle was inserted into a left hepatic duct. Contrast was injected opacifying the biliary tree. The needle was removed over a 018 wire. The Accustick transitional dilator was advanced over the wire to the mid biliary tree. It could not be advanced into the central biliary tree secondary to a very resistant partial obstruction. An Amplatz wire was advanced through the transitional dilator. This was advanced into the central biliary tree. Again, the 6 [place] transitional dilator of the Accustick set could not be advanced across the Amplatz wire. A 4 French glide catheter was advanced across the Amplatz wire into the central biliary tree. The glide catheter was advanced over a Bentson wire into the duodenum then was again exchange for the Amplatz wire. Initial attempts at dilating across the partial obstruction with serial dilators starting from 5 [place] were unsuccessful. A 5 French sheath was advanced over the Amplatz into the peripheral biliary tree a 4 French balloon was utilized to dilate the partial obstruction. The tract was then easily dilated to 8 [place] an 8  [place] biliary drain was then advanced over the Amplatz into the duodenum. Two additional sideholes were cut above the upper marker. It was looped and string fixed  in the duodenum then sewn to the skin. The initial aspirate was sent for a cytology analysis. Contrast was injected. FINDINGS: Percutaneous cholangiography into the left the biliary tree demonstrates very limited opacification the the bile ducts. This was performed in an attempt to prevent sepsis. Final images demonstrate left internal external biliary drain placement with its tip coiled in the duodenum. Central left-sided ducts only were opacified by contrast. COMPLICATIONS: None IMPRESSION: Successful left internal external biliary drain placement. An 8 French device was placed secondary to a resistant biliary obstruction. Initial bile aspirate was sent for cytology. Electronically Signed   By: Marybelle Killings M.D.   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-stable s/p PTC with int/ext drainage of left system yesterday  Cytology pending IR following-internalization/brushings later in week Continue Rocephin Start hydrocodone for pain  #2 new dx of ascites-improved post large volume paracentesis #3 new dx cirrhosis-Hep B and C markers are negative/other markers pending  Principal Problem:   Liver mass Active Problems:   Ascites   UTI (lower urinary tract infection)   Biliary obstruction due to malignant neoplasm    LOS: 3 days   Amy Esterwood  09/17/2015, 8:54 AM      Attending physician's note   I have taken an interval history, reviewed the chart and examined the patient. I agree with the Advanced Practitioner's note, impression and recommendations. Stable post PTC of left intrahepatic system. Hep B and C negative. Awaiting cytology, hepatic serologies and repeat PTC. CEA and AFP normal. CA19-9=153. Monitor Is/Os and daily weights. May need to  start lasix and aldactone for ascites.   Lucio Edward, MD [facility] [phone] Mon-Fri 8a-5p [phone] after 5p, weekends, holidays

## 2015-09-17 NOTE — Progress Notes (Signed)
[facility]                                                                              Patient Demographics  Eric Schroeder, is a 59 y.o. male, DOB - [birth year], GMU:[id number]  Admit date - 09/14/2015   Admitting Physician Bobette Mo, MD  Outpatient Primary MD for the patient is No primary care provider on file.  LOS - 3   Chief Complaint  Patient presents with  . Bloated  . Back Pain      HPI on 09/14/2015 by Dr. Ernestene Kiel Md Eric Schroeder is a 59 y.o. male with no significant past medical history who comes to the emergency department with complaints of abdominal distention, abdominal pain, bilateral flank pain, lower extremity edema for about 2 weeks. This was preceded by progressively worse bloating, early satiety for most of this year. He also endorses a 35 pound weight loss during this timeframe as well. He states that he adjusted his eating habits to this, did not think that there was any major health issue on until his abdominal distention and lower extremity edema. He denies chest pain, palpitations, dizziness, diaphoresis, PND, but complains of dyspnea and orthopnea (he feels his "abdomen is pushing on his lungs"). He denies fever, chills, but feels fatigued.  Interim history Found to have liver mass.  IR placed drain. GI and Onc consulted. Pending further recommendations.  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  Possibly internalization and brushings later this week.  Elevated bilirubin, AST, INR -Likely secondary to the above -Patient given FFP -Continue to monitor CMP  Urinary tract infection -UA showed 0-5 WBC, positive nitrites leukocytes, few bacteria -Patient was started on ceftriaxone -Urine culture shows no growth to date (was not collected upon admission before antibiotics started)  Code Status: Full  Family Communication: None at bedside  Disposition Plan: Admitted. Pending further recommendations from IR and Oncology  Time Spent in minutes   30 minutes  Procedures  US guided paracentesis MRCP PCT  Consults   Gastroenterology Interventional radiology Oncology   DVT Prophylaxis  SCDs  Lab Results  Component Value Date   PLT 119* 09/17/2015    Medications  Scheduled Meds: . cefTRIAXone (ROCEPHIN)  IV  1 g Intravenous Q24H  . sodium chloride  3 mL Intravenous Q12H   Continuous Infusions:  PRN Meds:.sodium chloride, HYDROcodone-acetaminophen, LORazepam, ondansetron **OR** ondansetron (ZOFRAN) IV, sodium chloride  Antibiotics    Anti-infectives    Start     Dose/Rate Route Frequency Ordered Stop   09/15/15 2000  cefTRIAXone (ROCEPHIN) 1 g  in dextrose 5 % 50 mL IVPB     1 g 100 mL/hr over 30 Minutes Intravenous Every 24 hours 09/14/15 2230     09/14/15 2045  cefTRIAXone (ROCEPHIN) 1 g in dextrose 5 % 50 mL IVPB     1 g 100 mL/hr over 30 Minutes Intravenous  Once 09/14/15 2031 09/14/15 2111      Subjective:   Eric Schroeder seen and examined today.  Patient feels his abdominal bloating has improved and feels better.  Anxious regarding diagnosis and when he can go home.  Denies chest pain or shortness of breath at this time.   Objective:   Filed Vitals:    09/16/15 1735 09/16/15 1740 09/16/15 2020 09/17/15 0547  BP: 105/68 102/63 126/72 132/76  Pulse: 74 75 84 88  Temp:   97.6 F (36.4 C) 98.6 F (37 C)  TempSrc:   Oral Oral  Resp: $Remo'20 22 20 18  'kvLEL$ Height:      Weight:      SpO2: 98% 99% 100% 100%    Wt Readings from Last 3 Encounters:  09/14/15 97.6 kg (215 lb 2.7 oz)     Intake/Output Summary (Last 24 hours) at 09/17/15 0959 Last data filed at 09/17/15 1224  Gross per 24 hour  Intake 511.63 ml  Output   3652 ml  Net -3140.37 ml    Exam  General: Well developed, well nourished, NAD  HEENT: NCAT, mild Icteic Sclera, mucous membranes moist.   Cardiovascular: S1 S2 auscultated, RRR, no murmurs  Respiratory: Clear to auscultation bilaterally with equal chest rise  Abdomen: Soft, nontender, nondistended, + bowel sounds, drain in place   Extremities: warm dry without cyanosis clubbing or edema  Neuro: AAOx3, nonfocal  Psych: Anxious, however, appropriate  Data Review   Micro Results Recent Results (from the past 240 hour(s))  Body fluid culture     Status: None (Preliminary result)   Collection Time: 09/15/15 10:34 AM  Result Value Ref Range Status   Specimen Description PERITONEAL FLUID  Final   Special Requests NONE  Final   Gram Stain   Final    FEW WBC PRESENT, PREDOMINANTLY MONONUCLEAR NO ORGANISMS SEEN Results Called to: CALLED [place] [id number] SEVERAL TIMES NO ANSWER [phone] [place]    Culture   Final    NO GROWTH 2 DAYS Performed at [facility]    Report Status PENDING  Incomplete  Culture, Urine     Status: None (Preliminary result)   Collection Time: 09/16/15 12:34 PM  Result Value Ref Range Status   Specimen Description URINE, CLEAN CATCH  Final   Special Requests NONE  Final   Culture   Final    NO GROWTH < 24 HOURS Performed at [facility]    Report Status PENDING  Incomplete    Radiology Reports Dg Chest 2 View  09/14/2015  CLINICAL DATA:  Bilateral ankle swelling. Abdominal  distention. Back pain. Shallow breathing. EXAM: CHEST  2 VIEW COMPARISON:  None. FINDINGS: Poor inspiration. Normal sized heart. Minimal left lateral pleural thickening. No pleural fluid is seen posteriorly on either side. Small amount of linear density at the left lung base. Mild thoracic spine degenerative changes. IMPRESSION: Poor inspiration with a small amount of pleural and parenchymal scarring at the left lung base. Electronically Signed   By: Claudie Revering M.D.   On: 09/14/2015 19:20   Ct Chest W Contrast  09/15/2015  CLINICAL DATA:  Liver mass diagnosed on CT 1 day prior. Status  post paracentesis. Fatigue. Inpatient. EXAM: CT CHEST WITH CONTRAST TECHNIQUE: Multidetector CT imaging of the chest was performed during intravenous contrast administration. CONTRAST:  73mL OMNIPAQUE IOHEXOL 300 MG/ML  SOLN COMPARISON:  09/14/2015 CT abdomen/ pelvis. Chest radiograph from 1 day prior. FINDINGS: Mediastinum/Nodes: Normal heart size. No pericardial fluid/thickening. Left anterior descending and left circumflex coronary atherosclerosis. Great vessels are normal in course and caliber. No central pulmonary emboli. Normal visualized thyroid. Large lower esophageal varices. No pathologically enlarged axillary, mediastinal or hilar lymph nodes. Lungs/Pleura: No pneumothorax. Trace left pleural effusion. Subsegmental atelectasis in both lower lobes. No acute consolidative airspace disease, significant pulmonary nodules or lung masses. Upper abdomen: Re- demonstrated is cirrhosis and diffuse severe intrahepatic biliary ductal dilatation, with re- demonstration of central liver 4.7 cm mass extending superiorly and inferiorly along the central bile ducts. There are stable subcentimeter hypodense lesions in the left liver lobe and simple cysts in the right liver lobe. Small volume upper abdominal ascites, decreased. Fat stranding throughout the upper abdominal mesenteric and omental fat, unchanged. Stable dilated visualized  proximal common bile duct. Stable mild upper retroperitoneal adenopathy. Musculoskeletal: Stable nondisplaced healing subacute lateral left eighth rib fracture. Mild-to-moderate degenerative changes in thoracic spine. IMPRESSION: 1. No evidence of metastatic disease in the chest. 2. Trace left pleural effusion. Mild bibasilar atelectasis. No acute consolidative airspace disease. 3. Three-vessel coronary atherosclerosis. 4. Stable nondisplaced healing subacute lateral left eighth rib fracture. 5. Re- demonstration of cirrhosis, central liver mass, severe diffuse intrahepatic biliary ductal dilatation, indeterminate subcentimeter hypodense left liver lobe lesions. Small volume upper abdominal ascites, decreased. Electronically Signed   By: Ilona Sorrel M.D.   On: 09/15/2015 13:47   Ct Abdomen Pelvis W Contrast  09/14/2015  CLINICAL DATA:  Abdominal distention. Back pain. Lower extremity swelling. EXAM: CT ABDOMEN AND PELVIS WITH CONTRAST TECHNIQUE: Multidetector CT imaging of the abdomen and pelvis was performed using the standard protocol following bolus administration of intravenous contrast. CONTRAST:  175mL OMNIPAQUE IOHEXOL 300 MG/ML  SOLN COMPARISON:  None. FINDINGS: Lower chest: No significant pulmonary nodules or acute consolidative airspace disease. Subsegmental atelectasis in the basilar lower lobes. Hepatobiliary: The liver surface is diffusely prominently irregular, and there is relative hypertrophy of the left liver lobe, in keeping with cirrhosis. There is severe diffuse intrahepatic biliary ductal dilatation. There is a lobulated 4.8 x 3.5 cm mass in the porta hepatis (series 2/ image 25), which demonstrates hypoenhancement relative to the liver parenchyma, and which demonstrates finger-like extensions into the right liver lobe along the biliary tree. 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 Simple appearing 1.9 cm liver cyst in the posterior right liver lobe.  Separate simple appearing 3.4 cm liver cyst in the posterior upper right liver lobe. There are at least 2 additional subcentimeter hypodense lesions in the left liver lobe, too small to characterize. Nondistended gallbladder with nonspecific mild diffuse gallbladder wall thickening. The common bile duct is dilated up to 14 mm in diameter proximally with smooth distal tapering. Pancreas: Normal, with no mass or duct dilation. Spleen: Normal size. No mass. Adrenals/Urinary Tract: Normal adrenals. Normal kidneys with no hydronephrosis and no renal mass. Normal bladder. Stomach/Bowel: Grossly normal stomach. Normal caliber small bowel with no small bowel wall thickening. Normal appendix. Normal large bowel with no diverticulosis, large bowel wall thickening or pericolonic fat stranding. Vascular/Lymphatic: Normal caliber abdominal aorta. Patent portal, splenic and renal veins. There are large esophageal varices. There are small paraumbilical varices. There are additional portosystemic varices in the  left omentum, which appear to drain into the left pelvic deep veins. There are multiple mildly enlarged porta hepatis nodes, largest 1.4 cm (series 2/ image 30). Reproductive: Normal size prostate with nonspecific internal prostatic calcifications. Other: No pneumoperitoneum. Moderate to large volume ascites, which measures simple fluid density. Nonspecific fat stranding throughout the greater omentum and mesentery. Musculoskeletal: No aggressive appearing focal osseous lesions. Subacute healing lateral left eighth rib fracture. Mild-to-moderate degenerative changes in the visualized thoracolumbar spine. IMPRESSION: 1. Cirrhosis. Lobulated 4.8 x 3.5 cm solid mass in the porta hepatis with finger-like extensions into the right liver lobe along the biliary tree. Severe diffuse intrahepatic biliary ductal dilatation. These findings are most suggestive of a [place] tumor (hilar cholangiocarcinoma), although the differential  includes a hepatocellular carcinoma. MRI abdomen with and without intravenous contrast could be performed after paracentesis for further evaluation. Recommend GI consultation for ERCP and possible biliary decompression, which may require IR consultation for percutaneous biliary decompression. 2. Large volume ascites. 3. Large esophageal varices. Small paraumbilical varices. Additional portosystemic left omental varices. 4. Nonspecific mild porta hepatis lymphadenopathy. 5. Separate subcentimeter hypodense liver lesions are too small to characterize and could represent liver metastases. 6. Subacute lateral left eighth rib fracture. These results were called by telephone at the time of interpretation on 09/14/2015 at 7:38 pm to Dr. Lonia Skinner , who verbally acknowledged these results. Electronically Signed   By: Ilona Sorrel M.D.   On: 09/14/2015 19:40   Mr 3d Recon At Scanner  09/15/2015  CLINICAL DATA:  Cirrhosis with ascites, distal esophageal varices and lobulated mass in the porta hepatis causing biliary obstruction on CT. EXAM: MRI ABDOMEN WITHOUT AND WITH CONTRAST (INCLUDING MRCP) TECHNIQUE: Multiplanar multisequence MR imaging of the abdomen was performed both before and after the administration of intravenous contrast. Heavily T2-weighted images of the biliary and pancreatic ducts were obtained, and three-dimensional MRCP images were rendered by post processing. CONTRAST:  40mL MULTIHANCE GADOBENATE DIMEGLUMINE 529 MG/ML IV SOLN COMPARISON:  Abdominal CT 09/14/2015. FINDINGS: Unfortunately, the study is mildly motion degraded due to the patient's inability to completely suspend respiration. Lower chest:  Small left-greater-than-right pleural effusions. Hepatobiliary: The liver contours are diffusely irregular consistent with cirrhosis. There are multiple siderotic nodules throughout the liver with diffuse suppression of hepatic signal on the in phase images consistent with hemosiderosis. There is a 1.6 cm  lesion in the medial segment of the left hepatic lobe which demonstrates T1 shortening and low level enhancement following contrast. No other enhancing intrahepatic lesions are identified. There are several hepatic cysts, largest projecting from the dome of the right hepatic lobe, measuring 3.5 cm. As demonstrated on CT, there is a tubular mass within the porta hepatis which appears intra biliary. This demonstrates intermediate T2 signal, low-level enhancement and measures up to 3.8 cm transverse and 6.3 cm in length. There is resulting marked intrahepatic biliary dilatation with the left hepatic duct measuring up to 1.7 cm. The distal common bile duct is normal in caliber. Pancreas: Mildly atrophied. No evidence of pancreatic mass or pancreatic ductal dilatation. Spleen: Normal in size without focal abnormality. Adrenals/Urinary Tract: Both adrenal glands appear normal. Small renal cysts are noted. There is no suspicious renal finding or hydronephrosis. Stomach/Bowel: No evidence of bowel wall thickening, distention or surrounding inflammatory change. Vascular/Lymphatic: Multiple mildly enlarged lymph nodes within the porta hepatis are again noted. 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 Other: Ascites has decreased in volume. No suspicious peritoneal enhancement.  Musculoskeletal: No acute or significant osseous findings. IMPRESSION: 1. 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 Tissue sampling and biliary decompression recommended. 2. Prominent nonspecific lymph nodes in the porta hepatis, likely related to chronic liver disease. No definite distant metastases. 3. Macronodular cirrhosis with multiple siderotic nodules. There is one T1 hyperintense nodule in the left lobe which demonstrates low level enhancement and could reflect a dysplastic  nodule. 4. Decreased volume of ascites following paracentesis. Electronically Signed   By: Richardean Sale M.D.   On: 09/15/2015 13:52   US Paracentesis  09/15/2015  Ardis Rowan, PA-C     09/15/2015 11:37 AM Successful US guided paracentesis from LLQ. Yielded 6 liters of clear yellow fluid. No immediate complications. Pt tolerated well. Specimen was sent for labs. WENDY S BLAIR PA-C 09/15/2015 11:37 AM   Mr Abd W/wo Cm/mrcp  09/15/2015  CLINICAL DATA:  Cirrhosis with ascites, distal esophageal varices and lobulated mass in the porta hepatis causing biliary obstruction on CT. EXAM: MRI ABDOMEN WITHOUT AND WITH CONTRAST (INCLUDING MRCP) TECHNIQUE: Multiplanar multisequence MR imaging of the abdomen was performed both before and after the administration of intravenous contrast. Heavily T2-weighted images of the biliary and pancreatic ducts were obtained, and three-dimensional MRCP images were rendered by post processing. CONTRAST:  51mL MULTIHANCE GADOBENATE DIMEGLUMINE 529 MG/ML IV SOLN COMPARISON:  Abdominal CT 09/14/2015. FINDINGS: Unfortunately, the study is mildly motion degraded due to the patient's inability to completely suspend respiration. Lower chest:  Small left-greater-than-right pleural effusions. Hepatobiliary: The liver contours are diffusely irregular consistent with cirrhosis. There are multiple siderotic nodules throughout the liver with diffuse suppression of hepatic signal on the in phase images consistent with hemosiderosis. There is a 1.6 cm lesion in the medial segment of the left hepatic lobe which demonstrates T1 shortening and low level enhancement following contrast. No other enhancing intrahepatic lesions are identified. There are several hepatic cysts, largest projecting from the dome of the right hepatic lobe, measuring 3.5 cm. As demonstrated on CT, there is a tubular mass within the porta hepatis which appears intra biliary. This demonstrates intermediate T2 signal,  low-level enhancement and measures up to 3.8 cm transverse and 6.3 cm in length. There is resulting marked intrahepatic biliary dilatation with the left hepatic duct measuring up to 1.7 cm. The distal common bile duct is normal in caliber. Pancreas: Mildly atrophied. No evidence of pancreatic mass or pancreatic ductal dilatation. Spleen: Normal in size without focal abnormality. Adrenals/Urinary Tract: Both adrenal glands appear normal. Small renal cysts are noted. There is no suspicious renal finding or hydronephrosis. Stomach/Bowel: No evidence of bowel wall thickening, distention or surrounding inflammatory change. Vascular/Lymphatic: Multiple mildly enlarged lymph nodes within the porta hepatis are again noted. 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 Other: Ascites has decreased in volume. No suspicious peritoneal enhancement. Musculoskeletal: No acute or significant osseous findings. IMPRESSION: 1. 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 Tissue sampling and biliary decompression recommended. 2. Prominent nonspecific lymph nodes in the porta hepatis, likely related to chronic liver disease. No definite distant metastases. 3. Macronodular cirrhosis with multiple siderotic nodules. There is one T1 hyperintense nodule in the left lobe which demonstrates low level enhancement and could reflect a dysplastic nodule. 4. Decreased volume of ascites following paracentesis. Electronically Signed   By: Caryl Comes.D.  On: 09/15/2015 13:52   Ir Int Lianne Cure Biliary Drain With Cholangiogram  09/17/2015  CLINICAL DATA:  Biliary obstruction EXAM: IR INT-EXT BILIARY DRAIN W/ CHOLANGIOGRAM; IR BALLOON DILATION OF BILIARY DUCT/AMPULLA FLUOROSCOPY TIME:  9 minutes and 6 seconds MEDICATIONS AND MEDICAL HISTORY: Versed 4 mg, Fentanyl 100 mcg. Additional  Medications: None.  Patient was on Rocephin. ANESTHESIA/SEDATION: Moderate sedation time: 60 minutes CONTRAST:  15 cc Omnipaque 300 PROCEDURE: The procedure, risks, benefits, and alternatives were explained to the patient. Questions regarding the procedure were encouraged and answered. The patient understands and consents to the procedure. The upper abdominal region was prepped with Betadine in a sterile fashion, and a sterile drape was applied covering the operative field. A sterile gown and sterile gloves were used for the procedure. Under sonographic guidance, a Chiba needle was inserted into a left hepatic duct. Contrast was injected opacifying the biliary tree. The needle was removed over a 018 wire. The Accustick transitional dilator was advanced over the wire to the mid biliary tree. It could not be advanced into the central biliary tree secondary to a very resistant partial obstruction. An Amplatz wire was advanced through the transitional dilator. This was advanced into the central biliary tree. Again, the 6 [place] transitional dilator of the Accustick set could not be advanced across the Amplatz wire. A 4 French glide catheter was advanced across the Amplatz wire into the central biliary tree. The glide catheter was advanced over a Bentson wire into the duodenum then was again exchange for the Amplatz wire. Initial attempts at dilating across the partial obstruction with serial dilators starting from 5 [place] were unsuccessful. A 5 French sheath was advanced over the Amplatz into the peripheral biliary tree a 4 French balloon was utilized to dilate the partial obstruction. The tract was then easily dilated to 8 [place] an 8 [place] biliary drain was then advanced over the Amplatz into the duodenum. Two additional sideholes were cut above the upper marker. It was looped and string fixed in the duodenum then sewn to the skin. The initial aspirate was sent for a cytology analysis. Contrast was injected.  FINDINGS: Percutaneous cholangiography into the left the biliary tree demonstrates very limited opacification the the bile ducts. This was performed in an attempt to prevent sepsis. Final images demonstrate left internal external biliary drain placement with its tip coiled in the duodenum. Central left-sided ducts only were opacified by contrast. COMPLICATIONS: None IMPRESSION: Successful left internal external biliary drain placement. An 8 French device was placed secondary to a resistant biliary obstruction. Initial bile aspirate was sent for cytology. Electronically Signed   By: Marybelle Killings M.D.   On: 09/17/2015 08:33   Ir Balloon Dilation Of Biliary Ducts/ampulla  09/17/2015  CLINICAL DATA:  Biliary obstruction EXAM: IR INT-EXT BILIARY DRAIN W/ CHOLANGIOGRAM; IR BALLOON DILATION OF BILIARY DUCT/AMPULLA FLUOROSCOPY TIME:  9 minutes and 6 seconds MEDICATIONS AND MEDICAL HISTORY: Versed 4 mg, Fentanyl 100 mcg. Additional Medications: None.  Patient was on Rocephin. ANESTHESIA/SEDATION: Moderate sedation time: 60 minutes CONTRAST:  15 cc Omnipaque 300 PROCEDURE: The procedure, risks, benefits, and alternatives were explained to the patient. Questions regarding the procedure were encouraged and answered. The patient understands and consents to the procedure. The upper abdominal region was prepped with Betadine in a sterile fashion, and a sterile drape was applied covering the operative field. A sterile gown and sterile gloves were used for the procedure. Under sonographic guidance, a Chiba needle was inserted into a left hepatic duct. Contrast was  injected opacifying the biliary tree. The needle was removed over a 018 wire. The Accustick transitional dilator was advanced over the wire to the mid biliary tree. It could not be advanced into the central biliary tree secondary to a very resistant partial obstruction. An Amplatz wire was advanced through the transitional dilator. This was advanced into the central  biliary tree. Again, the 6 [place] transitional dilator of the Accustick set could not be advanced across the Amplatz wire. A 4 French glide catheter was advanced across the Amplatz wire into the central biliary tree. The glide catheter was advanced over a Bentson wire into the duodenum then was again exchange for the Amplatz wire. Initial attempts at dilating across the partial obstruction with serial dilators starting from 5 [place] were unsuccessful. A 5 French sheath was advanced over the Amplatz into the peripheral biliary tree a 4 French balloon was utilized to dilate the partial obstruction. The tract was then easily dilated to 8 [place] an 8 [place] biliary drain was then advanced over the Amplatz into the duodenum. Two additional sideholes were cut above the upper marker. It was looped and string fixed in the duodenum then sewn to the skin. The initial aspirate was sent for a cytology analysis. Contrast was injected. FINDINGS: Percutaneous cholangiography into the left the biliary tree demonstrates very limited opacification the the bile ducts. This was performed in an attempt to prevent sepsis. Final images demonstrate left internal external biliary drain placement with its tip coiled in the duodenum. Central left-sided ducts only were opacified by contrast. COMPLICATIONS: None IMPRESSION: Successful left internal external biliary drain placement. An 8 French device was placed secondary to a resistant biliary obstruction. Initial bile aspirate was sent for cytology. Electronically Signed   By: Marybelle Killings M.D.   On: 09/17/2015 08:33    CBC  Recent Labs Lab 09/14/15 1628 09/15/15 0411 09/16/15 0430 09/17/15 0418  WBC 6.7 6.1 5.5 7.0  HGB 13.6 12.6* 12.8* 13.4  HCT 38.9* 35.5* 36.5* 37.6*  PLT 128* 106* 106* 119*  MCV 103.7* 102.9* 102.5* 103.3*  MCH 36.3* 36.5* 36.0* 36.8*  MCHC 35.0 35.5 35.1 35.6  RDW 14.4 14.4 14.3 14.2    Chemistries   Recent Labs Lab 09/14/15 1628 09/15/15 0411  09/16/15 0430 09/17/15 0418  NA 136 134* 133* 132*  K 4.2 4.2 4.2 4.1  CL 102 103 101 99*  CO2 $Re'28 26 27 28  'laY$ GLUCOSE 98 97 108* 105*  BUN $Re'12 13 12 16  'LTs$ CREATININE 0.69 0.52* 0.62 0.68  CALCIUM 8.7* 8.4* 8.2* 9.0  AST 103* 107* 109* 91*  ALT 48 48 50 46  ALKPHOS 221* 179* 177* 170*  BILITOT 4.0* 5.9* 3.6* 3.6*   ------------------------------------------------------------------------------------------------------------------ estimated creatinine clearance is 117 mL/min (by C-G formula based on Cr of 0.68). ------------------------------------------------------------------------------------------------------------------ No results for input(s): HGBA1C in the last 72 hours. ------------------------------------------------------------------------------------------------------------------ No results for input(s): CHOL, HDL, LDLCALC, TRIG, CHOLHDL, LDLDIRECT in the last 72 hours. ------------------------------------------------------------------------------------------------------------------ No results for input(s): TSH, T4TOTAL, T3FREE, THYROIDAB in the last 72 hours.  Invalid input(s): FREET3 ------------------------------------------------------------------------------------------------------------------  Recent Labs  09/15/15 1208  VITAMINB12 756  FOLATE 6.8  FERRITIN 1935*  TIBC NOT CALCULATED  IRON 76    Coagulation profile  Recent Labs Lab 09/15/15 1208  INR 1.57*    No results for input(s): DDIMER in the last 72 hours.  Cardiac Enzymes No results for input(s): CKMB, TROPONINI, MYOGLOBIN in the last 168 hours.  Invalid input(s): CK ------------------------------------------------------------------------------------------------------------------ Invalid input(s): POCBNP  Taesean Reth D.O. on 09/17/2015 at 9:59 AM  Between 7am to 7pm - Pager - [phone]  After 7pm go to [email or web address] - password [email or web address]  And look for the night coverage person covering  for me after hours  [facility] Office  [phone]

## 2015-09-18 LAB — COMPREHENSIVE METABOLIC PANEL
ALK PHOS: 171 U/L — AB (ref 38–126)
ALT: 43 U/L (ref 17–63)
ANION GAP: 8 (ref 5–15)
AST: 72 U/L — ABNORMAL HIGH (ref 15–41)
Albumin: 2.2 g/dL — ABNORMAL LOW (ref 3.5–5.0)
BILIRUBIN TOTAL: 2.8 mg/dL — AB (ref 0.3–1.2)
BUN: 19 mg/dL (ref 6–20)
CALCIUM: 9.4 mg/dL (ref 8.9–10.3)
CO2: 32 mmol/L (ref 22–32)
Chloride: 92 mmol/L — ABNORMAL LOW (ref 101–111)
Creatinine, Ser: 0.8 mg/dL (ref 0.61–1.24)
GLUCOSE: 107 mg/dL — AB (ref 65–99)
Potassium: 4.1 mmol/L (ref 3.5–5.1)
Sodium: 132 mmol/L — ABNORMAL LOW (ref 135–145)
TOTAL PROTEIN: 7.5 g/dL (ref 6.5–8.1)

## 2015-09-18 LAB — CBC
HEMATOCRIT: 40.9 % (ref 39.0–52.0)
HEMOGLOBIN: 14.8 g/dL (ref 13.0–17.0)
MCH: 37.2 pg — AB (ref 26.0–34.0)
MCHC: 36.2 g/dL — AB (ref 30.0–36.0)
MCV: 102.8 fL — AB (ref 78.0–100.0)
Platelets: 133 10*3/uL — ABNORMAL LOW (ref 150–400)
RBC: 3.98 MIL/uL — ABNORMAL LOW (ref 4.22–5.81)
RDW: 14.1 % (ref 11.5–15.5)
WBC: 13.4 10*3/uL — ABNORMAL HIGH (ref 4.0–10.5)

## 2015-09-18 LAB — BODY FLUID CULTURE: CULTURE: NO GROWTH

## 2015-09-18 LAB — PROTIME-INR
INR: 1.48 (ref 0.00–1.49)
PROTHROMBIN TIME: 18 s — AB (ref 11.6–15.2)

## 2015-09-18 LAB — MAGNESIUM: MAGNESIUM: 1.9 mg/dL (ref 1.7–2.4)

## 2015-09-18 MED ORDER — FLUCONAZOLE IN SODIUM CHLORIDE 400-0.9 MG/200ML-% IV SOLN
800.0000 mg | Freq: Once | INTRAVENOUS | Status: AC
  Administered 2015-09-18: 800 mg via INTRAVENOUS
  Filled 2015-09-18: qty 400

## 2015-09-18 MED ORDER — FLUCONAZOLE IN SODIUM CHLORIDE 400-0.9 MG/200ML-% IV SOLN
400.0000 mg | INTRAVENOUS | Status: DC
  Administered 2015-09-19 – 2015-09-21 (×3): 400 mg via INTRAVENOUS
  Filled 2015-09-18 (×4): qty 200

## 2015-09-18 MED ORDER — DEXTROSE-NACL 5-0.45 % IV SOLN
INTRAVENOUS | Status: DC
  Administered 2015-09-18 – 2015-09-19 (×2): via INTRAVENOUS

## 2015-09-18 MED ORDER — OXYCODONE HCL ER 10 MG PO T12A
10.0000 mg | EXTENDED_RELEASE_TABLET | Freq: Two times a day (BID) | ORAL | Status: DC
  Administered 2015-09-18 – 2015-09-23 (×11): 10 mg via ORAL
  Filled 2015-09-18 (×11): qty 1

## 2015-09-18 NOTE — Progress Notes (Signed)
CRITICAL VALUE ALERT  Critical value received:  Bile culture -- 1 day growth shows rare yeast   Date of notification:  09/18/2015  Time of notification:  [id number]  Critical value read back:Yes.    Nurse who received alert:  Aldean Baker, RN  MD notified (1st page):  Samtani  Time of first page:  1300  MD notified (2nd page):  Time of second page:  Responding MD:  Verlon Au  Time MD responded:  [id number] -- no new orders

## 2015-09-18 NOTE — Progress Notes (Signed)
Patient ID: Eric Schroeder, male   DOB: [birth year], 59 y.o.   MRN: [id number]   Subjective: Patient with continued epigastric discomfort; also with mild nausea Not much appetite.   Allergies: Review of patient's allergies indicates no known allergies.  Medications:  Current facility-administered medications:  .  0.9 %  sodium chloride infusion, 250 mL, Intravenous, PRN, Maryann Mikhail, DO .  cefTRIAXone (ROCEPHIN) 1 g in dextrose 5 % 50 mL IVPB, 1 g, Intravenous, Q24H, Reubin Milan, MD, 1 g at 09/17/15 2008 .  feeding supplement (ENSURE ENLIVE) (ENSURE ENLIVE) liquid 237 mL, 237 mL, Oral, BID BM, Clayton Bibles, RD, 237 mL at 09/17/15 1405 .  HYDROcodone-acetaminophen (NORCO/VICODIN) 5-325 MG per tablet 1-2 tablet, 1-2 tablet, Oral, Q4H PRN, Amy S Esterwood, PA-C, 2 tablet at 09/18/15 0745 .  LORazepam (ATIVAN) injection 1 mg, 1 mg, Intravenous, QHS PRN,MR X 1, Reubin Milan, MD .  ondansetron [facility]) tablet 4 mg, 4 mg, Oral, Q6H PRN **OR** ondansetron (ZOFRAN) injection 4 mg, 4 mg, Intravenous, Q6H PRN, Reubin Milan, MD .  sodium chloride 0.9 % injection 3 mL, 3 mL, Intravenous, Q12H, Maryann Mikhail, DO, 3 mL at 09/17/15 2130 .  sodium chloride 0.9 % injection 3 mL, 3 mL, Intravenous, PRN, Maryann Mikhail, DO    Vital Signs: BP 121/79 mmHg  Pulse 104  Temp(Src) 98.4 F (36.9 C) (Oral)  Resp 20  Ht 5' 9.5" (1.765 m)  Wt 181 lb 3.5 oz (82.2 kg)  BMI 26.39 kg/m2  SpO2 99%  Physical Exam awake, alert. Left biliary drain catheter intact, insertion site okay, mild to moderately tender to palpation; output >5L yesterday, about 254mL in bag now  Imaging: Dg Chest 2 View  09/14/2015  CLINICAL DATA:  Bilateral ankle swelling. Abdominal distention. Back pain. Shallow breathing. EXAM: CHEST  2 VIEW COMPARISON:  None. FINDINGS: Poor inspiration. Normal sized heart. Minimal left lateral pleural thickening. No pleural fluid is seen posteriorly on either side. Small amount  of linear density at the left lung base. Mild thoracic spine degenerative changes. IMPRESSION: Poor inspiration with a small amount of pleural and parenchymal scarring at the left lung base. Electronically Signed   By: Claudie Revering M.D.   On: 09/14/2015 19:20   Ct Chest W Contrast  09/15/2015  CLINICAL DATA:  Liver mass diagnosed on CT 1 day prior. Status post paracentesis. Fatigue. Inpatient. EXAM: CT CHEST WITH CONTRAST TECHNIQUE: Multidetector CT imaging of the chest was performed during intravenous contrast administration. CONTRAST:  51mL OMNIPAQUE IOHEXOL 300 MG/ML  SOLN COMPARISON:  09/14/2015 CT abdomen/ pelvis. Chest radiograph from 1 day prior. FINDINGS: Mediastinum/Nodes: Normal heart size. No pericardial fluid/thickening. Left anterior descending and left circumflex coronary atherosclerosis. Great vessels are normal in course and caliber. No central pulmonary emboli. Normal visualized thyroid. Large lower esophageal varices. No pathologically enlarged axillary, mediastinal or hilar lymph nodes. Lungs/Pleura: No pneumothorax. Trace left pleural effusion. Subsegmental atelectasis in both lower lobes. No acute consolidative airspace disease, significant pulmonary nodules or lung masses. Upper abdomen: Re- demonstrated is cirrhosis and diffuse severe intrahepatic biliary ductal dilatation, with re- demonstration of central liver 4.7 cm mass extending superiorly and inferiorly along the central bile ducts. There are stable subcentimeter hypodense lesions in the left liver lobe and simple cysts in the right liver lobe. Small volume upper abdominal ascites, decreased. Fat stranding throughout the upper abdominal mesenteric and omental fat, unchanged. Stable dilated visualized proximal common bile duct. Stable mild upper retroperitoneal adenopathy. Musculoskeletal: Stable nondisplaced healing  subacute lateral left eighth rib fracture. Mild-to-moderate degenerative changes in thoracic spine. IMPRESSION: 1. No  evidence of metastatic disease in the chest. 2. Trace left pleural effusion. Mild bibasilar atelectasis. No acute consolidative airspace disease. 3. Three-vessel coronary atherosclerosis. 4. Stable nondisplaced healing subacute lateral left eighth rib fracture. 5. Re- demonstration of cirrhosis, central liver mass, severe diffuse intrahepatic biliary ductal dilatation, indeterminate subcentimeter hypodense left liver lobe lesions. Small volume upper abdominal ascites, decreased. Electronically Signed   By: Ilona Sorrel M.D.   On: 09/15/2015 13:47   Ct Abdomen Pelvis W Contrast  09/14/2015  CLINICAL DATA:  Abdominal distention. Back pain. Lower extremity swelling. EXAM: CT ABDOMEN AND PELVIS WITH CONTRAST TECHNIQUE: Multidetector CT imaging of the abdomen and pelvis was performed using the standard protocol following bolus administration of intravenous contrast. CONTRAST:  172mL OMNIPAQUE IOHEXOL 300 MG/ML  SOLN COMPARISON:  None. FINDINGS: Lower chest: No significant pulmonary nodules or acute consolidative airspace disease. Subsegmental atelectasis in the basilar lower lobes. Hepatobiliary: The liver surface is diffusely prominently irregular, and there is relative hypertrophy of the left liver lobe, in keeping with cirrhosis. There is severe diffuse intrahepatic biliary ductal dilatation. There is a lobulated 4.8 x 3.5 cm mass in the porta hepatis (series 2/ image 25), which demonstrates hypoenhancement relative to the liver parenchyma, and which demonstrates finger-like extensions into the right liver lobe along the biliary tree. 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 Simple appearing 1.9 cm liver cyst in the posterior right liver lobe. Separate simple appearing 3.4 cm liver cyst in the posterior upper right liver lobe. There are at least 2 additional subcentimeter hypodense lesions in the left liver lobe, too small to characterize. Nondistended gallbladder with  nonspecific mild diffuse gallbladder wall thickening. The common bile duct is dilated up to 14 mm in diameter proximally with smooth distal tapering. Pancreas: Normal, with no mass or duct dilation. Spleen: Normal size. No mass. Adrenals/Urinary Tract: Normal adrenals. Normal kidneys with no hydronephrosis and no renal mass. Normal bladder. Stomach/Bowel: Grossly normal stomach. Normal caliber small bowel with no small bowel wall thickening. Normal appendix. Normal large bowel with no diverticulosis, large bowel wall thickening or pericolonic fat stranding. Vascular/Lymphatic: Normal caliber abdominal aorta. Patent portal, splenic and renal veins. There are large esophageal varices. There are small paraumbilical varices. There are additional portosystemic varices in the left omentum, which appear to drain into the left pelvic deep veins. There are multiple mildly enlarged porta hepatis nodes, largest 1.4 cm (series 2/ image 30). Reproductive: Normal size prostate with nonspecific internal prostatic calcifications. Other: No pneumoperitoneum. Moderate to large volume ascites, which measures simple fluid density. Nonspecific fat stranding throughout the greater omentum and mesentery. Musculoskeletal: No aggressive appearing focal osseous lesions. Subacute healing lateral left eighth rib fracture. Mild-to-moderate degenerative changes in the visualized thoracolumbar spine. IMPRESSION: 1. Cirrhosis. Lobulated 4.8 x 3.5 cm solid mass in the porta hepatis with finger-like extensions into the right liver lobe along the biliary tree. Severe diffuse intrahepatic biliary ductal dilatation. 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 MRI abdomen with and without intravenous contrast could be performed after paracentesis for further evaluation. Recommend GI consultation for ERCP and possible biliary decompression, which may  require IR consultation for percutaneous biliary decompression. 2. Large volume ascites. 3. Large esophageal varices. Small paraumbilical varices. Additional portosystemic left omental varices. 4. Nonspecific mild porta hepatis lymphadenopathy. 5. Separate subcentimeter hypodense liver lesions are too  small to characterize and could represent liver metastases. 6. Subacute lateral left eighth rib fracture. These results were called by telephone at the time of interpretation on 09/14/2015 at 7:38 pm to Dr. Lonia Skinner , who verbally acknowledged these results. Electronically Signed   By: Ilona Sorrel M.D.   On: 09/14/2015 19:40   Mr 3d Recon At Scanner  09/15/2015  CLINICAL DATA:  Cirrhosis with ascites, distal esophageal varices and lobulated mass in the porta hepatis causing biliary obstruction on CT. EXAM: MRI ABDOMEN WITHOUT AND WITH CONTRAST (INCLUDING MRCP) TECHNIQUE: Multiplanar multisequence MR imaging of the abdomen was performed both before and after the administration of intravenous contrast. Heavily T2-weighted images of the biliary and pancreatic ducts were obtained, and three-dimensional MRCP images were rendered by post processing. CONTRAST:  45mL MULTIHANCE GADOBENATE DIMEGLUMINE 529 MG/ML IV SOLN COMPARISON:  Abdominal CT 09/14/2015. FINDINGS: Unfortunately, the study is mildly motion degraded due to the patient's inability to completely suspend respiration. Lower chest:  Small left-greater-than-right pleural effusions. Hepatobiliary: The liver contours are diffusely irregular consistent with cirrhosis. There are multiple siderotic nodules throughout the liver with diffuse suppression of hepatic signal on the in phase images consistent with hemosiderosis. There is a 1.6 cm lesion in the medial segment of the left hepatic lobe which demonstrates T1 shortening and low level enhancement following contrast. No other enhancing intrahepatic lesions are identified. There are several hepatic cysts, largest  projecting from the dome of the right hepatic lobe, measuring 3.5 cm. As demonstrated on CT, there is a tubular mass within the porta hepatis which appears intra biliary. This demonstrates intermediate T2 signal, low-level enhancement and measures up to 3.8 cm transverse and 6.3 cm in length. There is resulting marked intrahepatic biliary dilatation with the left hepatic duct measuring up to 1.7 cm. The distal common bile duct is normal in caliber. Pancreas: Mildly atrophied. No evidence of pancreatic mass or pancreatic ductal dilatation. Spleen: Normal in size without focal abnormality. Adrenals/Urinary Tract: Both adrenal glands appear normal. Small renal cysts are noted. There is no suspicious renal finding or hydronephrosis. Stomach/Bowel: No evidence of bowel wall thickening, distention or surrounding inflammatory change. Vascular/Lymphatic: Multiple mildly enlarged lymph nodes within the porta hepatis are again noted. 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 Other: Ascites has decreased in volume. No suspicious peritoneal enhancement. Musculoskeletal: No acute or significant osseous findings. IMPRESSION: 1. 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 Tissue sampling and biliary decompression recommended. 2. Prominent nonspecific lymph nodes in the porta hepatis, likely related to chronic liver disease. No definite distant metastases. 3. Macronodular cirrhosis with multiple siderotic nodules. There is one T1 hyperintense nodule in the left lobe which demonstrates low level enhancement and could reflect a dysplastic nodule. 4. Decreased volume of ascites following paracentesis. Electronically Signed   By: Richardean Sale M.D.   On: 09/15/2015 13:52   US Paracentesis  09/15/2015  Ardis Rowan, PA-C     09/15/2015 11:37 AM Successful US  guided paracentesis from [facility]. Yielded 6 liters of clear yellow fluid. No immediate complications. Pt tolerated well. Specimen was sent for labs. WENDY S BLAIR PA-C 09/15/2015 11:37 AM   Mr Abd W/wo Cm/mrcp  09/15/2015  CLINICAL DATA:  Cirrhosis with ascites, distal esophageal varices and lobulated mass in the porta hepatis causing biliary obstruction on CT. EXAM: MRI ABDOMEN WITHOUT AND WITH CONTRAST (INCLUDING MRCP) TECHNIQUE: Multiplanar multisequence  MR imaging of the abdomen was performed both before and after the administration of intravenous contrast. Heavily T2-weighted images of the biliary and pancreatic ducts were obtained, and three-dimensional MRCP images were rendered by post processing. CONTRAST:  91mL MULTIHANCE GADOBENATE DIMEGLUMINE 529 MG/ML IV SOLN COMPARISON:  Abdominal CT 09/14/2015. FINDINGS: Unfortunately, the study is mildly motion degraded due to the patient's inability to completely suspend respiration. Lower chest:  Small left-greater-than-right pleural effusions. Hepatobiliary: The liver contours are diffusely irregular consistent with cirrhosis. There are multiple siderotic nodules throughout the liver with diffuse suppression of hepatic signal on the in phase images consistent with hemosiderosis. There is a 1.6 cm lesion in the medial segment of the left hepatic lobe which demonstrates T1 shortening and low level enhancement following contrast. No other enhancing intrahepatic lesions are identified. There are several hepatic cysts, largest projecting from the dome of the right hepatic lobe, measuring 3.5 cm. As demonstrated on CT, there is a tubular mass within the porta hepatis which appears intra biliary. This demonstrates intermediate T2 signal, low-level enhancement and measures up to 3.8 cm transverse and 6.3 cm in length. There is resulting marked intrahepatic biliary dilatation with the left hepatic duct measuring up to 1.7 cm. The distal common bile duct is normal in caliber.  Pancreas: Mildly atrophied. No evidence of pancreatic mass or pancreatic ductal dilatation. Spleen: Normal in size without focal abnormality. Adrenals/Urinary Tract: Both adrenal glands appear normal. Small renal cysts are noted. There is no suspicious renal finding or hydronephrosis. Stomach/Bowel: No evidence of bowel wall thickening, distention or surrounding inflammatory change. Vascular/Lymphatic: Multiple mildly enlarged lymph nodes within the porta hepatis are again noted. 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 Other: Ascites has decreased in volume. No suspicious peritoneal enhancement. Musculoskeletal: No acute or significant osseous findings. IMPRESSION: 1. 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 Tissue sampling and biliary decompression recommended. 2. Prominent nonspecific lymph nodes in the porta hepatis, likely related to chronic liver disease. No definite distant metastases. 3. Macronodular cirrhosis with multiple siderotic nodules. There is one T1 hyperintense nodule in the left lobe which demonstrates low level enhancement and could reflect a dysplastic nodule. 4. Decreased volume of ascites following paracentesis. Electronically Signed   By: Richardean Sale M.D.   On: 09/15/2015 13:52   Ir Int Lianne Cure Biliary Drain With Cholangiogram  09/17/2015  CLINICAL DATA:  Biliary obstruction EXAM: IR INT-EXT BILIARY DRAIN W/ CHOLANGIOGRAM; IR BALLOON DILATION OF BILIARY DUCT/AMPULLA FLUOROSCOPY TIME:  9 minutes and 6 seconds MEDICATIONS AND MEDICAL HISTORY: Versed 4 mg, Fentanyl 100 mcg. Additional Medications: None.  Patient was on Rocephin. ANESTHESIA/SEDATION: Moderate sedation time: 60 minutes CONTRAST:  15 cc Omnipaque 300 PROCEDURE: The procedure, risks, benefits, and alternatives were explained to the patient. Questions regarding  the procedure were encouraged and answered. The patient understands and consents to the procedure. The upper abdominal region was prepped with Betadine in a sterile fashion, and a sterile drape was applied covering the operative field. A sterile gown and sterile gloves were used for the procedure. Under sonographic guidance, a Chiba needle was inserted into a left hepatic duct. Contrast was injected opacifying the biliary tree. The needle was removed over a 018 wire. The Accustick transitional dilator was advanced over the wire to the mid biliary tree. It could not be advanced into the central biliary tree secondary to a very resistant partial obstruction. An  Amplatz wire was advanced through the transitional dilator. This was advanced into the central biliary tree. Again, the 6 [place] transitional dilator of the Accustick set could not be advanced across the Amplatz wire. A 4 French glide catheter was advanced across the Amplatz wire into the central biliary tree. The glide catheter was advanced over a Bentson wire into the duodenum then was again exchange for the Amplatz wire. Initial attempts at dilating across the partial obstruction with serial dilators starting from 5 [place] were unsuccessful. A 5 French sheath was advanced over the Amplatz into the peripheral biliary tree a 4 French balloon was utilized to dilate the partial obstruction. The tract was then easily dilated to 8 [place] an 8 [place] biliary drain was then advanced over the Amplatz into the duodenum. Two additional sideholes were cut above the upper marker. It was looped and string fixed in the duodenum then sewn to the skin. The initial aspirate was sent for a cytology analysis. Contrast was injected. FINDINGS: Percutaneous cholangiography into the left the biliary tree demonstrates very limited opacification the the bile ducts. This was performed in an attempt to prevent sepsis. Final images demonstrate left internal external biliary drain  placement with its tip coiled in the duodenum. Central left-sided ducts only were opacified by contrast. COMPLICATIONS: None IMPRESSION: Successful left internal external biliary drain placement. An 8 French device was placed secondary to a resistant biliary obstruction. Initial bile aspirate was sent for cytology. Electronically Signed   By: Marybelle Killings M.D.   On: 09/17/2015 08:33   Ir Balloon Dilation Of Biliary Ducts/ampulla  09/17/2015  CLINICAL DATA:  Biliary obstruction EXAM: IR INT-EXT BILIARY DRAIN W/ CHOLANGIOGRAM; IR BALLOON DILATION OF BILIARY DUCT/AMPULLA FLUOROSCOPY TIME:  9 minutes and 6 seconds MEDICATIONS AND MEDICAL HISTORY: Versed 4 mg, Fentanyl 100 mcg. Additional Medications: None.  Patient was on Rocephin. ANESTHESIA/SEDATION: Moderate sedation time: 60 minutes CONTRAST:  15 cc Omnipaque 300 PROCEDURE: The procedure, risks, benefits, and alternatives were explained to the patient. Questions regarding the procedure were encouraged and answered. The patient understands and consents to the procedure. The upper abdominal region was prepped with Betadine in a sterile fashion, and a sterile drape was applied covering the operative field. A sterile gown and sterile gloves were used for the procedure. Under sonographic guidance, a Chiba needle was inserted into a left hepatic duct. Contrast was injected opacifying the biliary tree. The needle was removed over a 018 wire. The Accustick transitional dilator was advanced over the wire to the mid biliary tree. It could not be advanced into the central biliary tree secondary to a very resistant partial obstruction. An Amplatz wire was advanced through the transitional dilator. This was advanced into the central biliary tree. Again, the 6 [place] transitional dilator of the Accustick set could not be advanced across the Amplatz wire. A 4 French glide catheter was advanced across the Amplatz wire into the central biliary tree. The glide catheter was  advanced over a Bentson wire into the duodenum then was again exchange for the Amplatz wire. Initial attempts at dilating across the partial obstruction with serial dilators starting from 5 [place] were unsuccessful. A 5 French sheath was advanced over the Amplatz into the peripheral biliary tree a 4 French balloon was utilized to dilate the partial obstruction. The tract was then easily dilated to 8 [place] an 8 [place] biliary drain was then advanced over the Amplatz into the duodenum. Two additional sideholes were cut above the upper marker. It was looped  and string fixed in the duodenum then sewn to the skin. The initial aspirate was sent for a cytology analysis. Contrast was injected. FINDINGS: Percutaneous cholangiography into the left the biliary tree demonstrates very limited opacification the the bile ducts. This was performed in an attempt to prevent sepsis. Final images demonstrate left internal external biliary drain placement with its tip coiled in the duodenum. Central left-sided ducts only were opacified by contrast. COMPLICATIONS: None IMPRESSION: Successful left internal external biliary drain placement. An 8 French device was placed secondary to a resistant biliary obstruction. Initial bile aspirate was sent for cytology. Electronically Signed   By: Marybelle Killings M.D.   On: 09/17/2015 08:33    Labs:  CBC:  Recent Labs  09/15/15 0411 09/16/15 0430 09/17/15 0418 09/18/15 0400  WBC 6.1 5.5 7.0 13.4*  HGB 12.6* 12.8* 13.4 14.8  HCT 35.5* 36.5* 37.6* 40.9  PLT 106* 106* 119* 133*    COAGS:  Recent Labs  09/15/15 1208 09/18/15 0400  INR 1.57* 1.48  APTT 38*  --     BMP:  Recent Labs  09/15/15 0411 09/16/15 0430 09/17/15 0418 09/18/15 0400  NA 134* 133* 132* 132*  K 4.2 4.2 4.1 4.1  CL 103 101 99* 92*  CO2 $Re'26 27 28 'rfD$ 32  GLUCOSE 97 108* 105* 107*  BUN $Re'13 12 16 19  'AiU$ CALCIUM 8.4* 8.2* 9.0 9.4  CREATININE 0.52* 0.62 0.68 0.80  GFRNONAA >60 >60 >60 >60  GFRAA >60 >60 >60  >60    LIVER FUNCTION TESTS:  Recent Labs  09/15/15 0411 09/16/15 0430 09/17/15 0418 09/18/15 0400  BILITOT 5.9* 3.6* 3.6* 2.8*  AST 107* 109* 91* 72*  ALT 48 50 46 43  ALKPHOS 179* 177* 170* 171*  PROT 6.2* 6.1* 6.6 7.5  ALBUMIN 2.0* 1.8* 2.1* 2.2*    Assessment and Plan: Patient with history of cirrhosis, ascites, 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  Cx/cytology pending High volume output, watch labs as Na and Cl steady trend down. May need brush bx if fluid cytology non-diagnostic Continue flushing drain tid.  SignedAscencion Dike 09/18/2015, 9:33 AM   I spent a total of 15 minutes at the the patient's bedside AND on the patient's hospital floor or unit, greater than 50% of which was counseling/coordinating care for biliary drain

## 2015-09-18 NOTE — Progress Notes (Signed)
ANTIBIOTIC CONSULT NOTE - INITIAL  Pharmacy Consult for Fluconazole Indication: yeast in biliary tract  No Known Allergies  Patient Measurements: Height: 5' 9.5" (176.5 cm) Weight: 181 lb 3.5 oz (82.2 kg) IBW/kg (Calculated) : 71.85  Vital Signs: Temp: 98.4 F (36.9 C) (12/07 1501) Temp Source: Oral (12/07 1501) BP: 123/84 mmHg (12/07 1501) Pulse Rate: 110 (12/07 1501) Intake/Output from previous day: 12/06 0701 - 12/07 0700 In: 620 [P.O.:620] Out: 5250 [Drains:5250] Intake/Output from this shift: Total I/O In: 240 [P.O.:240] Out: 1300 [Drains:1300]  Labs:  Recent Labs  09/16/15 0430 09/17/15 0418 09/18/15 0400  WBC 5.5 7.0 13.4*  HGB 12.8* 13.4 14.8  PLT 106* 119* 133*  CREATININE 0.62 0.68 0.80   Estimated Creatinine Clearance: 102.4 mL/min (by C-G formula based on Cr of 0.8). No results for input(s): VANCOTROUGH, VANCOPEAK, VANCORANDOM, GENTTROUGH, GENTPEAK, GENTRANDOM, TOBRATROUGH, TOBRAPEAK, TOBRARND, AMIKACINPEAK, AMIKACINTROU, AMIKACIN in the last 72 hours.   Microbiology: Recent Results (from the past 720 hour(s))  Body fluid culture     Status: None   Collection Time: 09/15/15 10:34 AM  Result Value Ref Range Status   Specimen Description PERITONEAL FLUID  Final   Special Requests NONE  Final   Gram Stain   Final    FEW WBC PRESENT, PREDOMINANTLY MONONUCLEAR NO ORGANISMS SEEN Results Called to: CALLED [facility] [id number] SEVERAL TIMES NO ANSWER [id number] [place]    Culture   Final    NO GROWTH 3 DAYS Performed at [facility]    Report Status 09/18/2015 FINAL  Final  Culture, Urine     Status: None   Collection Time: 09/16/15 12:34 PM  Result Value Ref Range Status   Specimen Description URINE, CLEAN CATCH  Final   Special Requests NONE  Final   Culture   Final    NO GROWTH 1 DAY Performed at [facility]    Report Status 09/17/2015 FINAL  Final  Body fluid culture     Status: None (Preliminary result)   Collection Time: 09/17/15  5:12  PM  Result Value Ref Range Status   Specimen Description BILE  Final   Special Requests Normal  Final   Gram Stain   Final    RARE WBC PRESENT, PREDOMINANTLY MONONUCLEAR NO ORGANISMS SEEN    Culture   Final    RARE YEAST CRITICAL RESULT CALLED TO, READ BACK BY AND VERIFIED WITH: R BALDWIN 09/18/15 @ 70 M VESTAL Performed at [facility]    Report Status PENDING  Incomplete    Medical History: History reviewed. No pertinent past medical history.   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 Bile cultures growing yeast so starting fluconazole.  CrCl>100 ml/min.  WBC elevated and increased today.  Afebrile.  Antimicrobials this admission: 12/3 Ceftriaxone >>  12/7 Fluconazole >>   Microbiology Results: 12/6 bile fluid culture: rare yeast 12/4 peritoneal fluid cultures: NGF 12/5 UCx: NGF   Goal of Therapy:  Doses adjusted per renal function Eradication of infection  Plan:  Fluconazole 800 mg IV x 1 then 400 mg IV q24h. Will plan to switch to PO as soon as patient shows improvement.  Eric Schroeder 09/18/2015,3:06 PM

## 2015-09-18 NOTE — Progress Notes (Signed)
Biliary drain flushed with 5cc normal saline.  Patient tolerated fine. Will continue to monitor.

## 2015-09-18 NOTE — Progress Notes (Signed)
[facility]                                                                              HPI on 09/14/2015 by Dr. Ernestene Kiel Eric Schroeder is a 59 y.o. male with no significant past medical history who comes to the emergency department with complaints of abdominal distention, abdominal pain, bilateral flank pain, lower extremity edema for about 2 weeks. This was preceded by progressively worse bloating, early satiety for most of this year. He also endorses a 35 pound weight loss during this timeframe as well. He states that he adjusted his eating habits to this, did not think that there was any major health issue on until his abdominal distention and lower extremity edema. He denies chest pain, palpitations, dizziness, diaphoresis, PND, but complains of dyspnea and orthopnea (he feels his "abdomen is pushing on his lungs"). He denies fever, chills, but feels fatigued.  Interim history Found to have liver mass.  IR placed drain. GI and Onc consulted. Pending further recommendations.  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 PBC -Hepatitis A antibody +, otherwise hepatitis panel unremarkable -Fluid culture shows some yeast so Fluconazole started along with Ceftriaxone -Cytology showed no malignant cells -Oncology consulted and appreciated  -S/p PTC by IR on 12/5 (output >3L).    Elevated bilirubin, AST, INR -Likely  secondary to the above -Patient given FFP -Continue to monitor CMP  Urinary tract infection -UA showed 0-5 WBC, positive nitrites leukocytes, few bacteria -Patient was started on ceftriaxone -Urine culture shows no growth to date (was not collected upon admission before antibiotics started)  Code Status: Full  Family Communication: None at bedside  Disposition Plan: Admitted. Pending further recommendations from IR and Oncology  Time Spent in minutes   30 minutes  Procedures  US guided paracentesis MRCP PCT  Consults   Gastroenterology Interventional radiology Oncology   DVT Prophylaxis  SCDs  Lab Results  Component Value Date   PLT 133* 09/18/2015    Medications  Scheduled Meds: . cefTRIAXone (ROCEPHIN)  IV  1 g Intravenous Q24H  . feeding supplement (ENSURE ENLIVE)  237 mL Oral BID BM  . fluconazole (DIFLUCAN) IV  800 mg Intravenous Once   Followed by  . [START ON 09/19/2015] fluconazole (DIFLUCAN) IV  400 mg Intravenous Q24H  . oxyCODONE  10 mg Oral Q12H  . sodium chloride  3 mL Intravenous Q12H   Continuous Infusions: . dextrose 5 % and 0.45% NaCl 75 mL/hr at 09/18/15 1053   PRN Meds:.sodium chloride, LORazepam, ondansetron **OR** ondansetron (ZOFRAN) IV, sodium chloride  Antibiotics    Anti-infectives    Start     Dose/Rate Route Frequency Ordered Stop   09/19/15 1600  fluconazole (DIFLUCAN) IVPB  400 mg     400 mg 100 mL/hr over 120 Minutes Intravenous Every 24 hours 09/18/15 1410     09/18/15 1500  fluconazole (DIFLUCAN) IVPB 800 mg     800 mg 200 mL/hr over 120 Minutes Intravenous  Once 09/18/15 1410     09/15/15 2000  cefTRIAXone (ROCEPHIN) 1 g in dextrose 5 % 50 mL IVPB     1 g 100 mL/hr over 30 Minutes Intravenous Every 24 hours 09/14/15 2230     09/14/15 2045  cefTRIAXone (ROCEPHIN) 1 g in dextrose 5 % 50 mL IVPB     1 g 100 mL/hr over 30 Minutes Intravenous  Once 09/14/15 2031 09/14/15 2111      Subjective:   Fair  No new  issues Feels a  Little better No n/v/cp   Objective:   Filed Vitals:   09/17/15 1404 09/17/15 2109 09/18/15 0611 09/18/15 1501  BP:  129/84 121/79 123/84  Pulse:  94 104 110  Temp:  98.1 F (36.7 C) 98.4 F (36.9 C) 98.4 F (36.9 C)  TempSrc:  Oral Oral Oral  Resp:  $Remo'18 20 20  'SLjFH$ Height: 5' 9.5" (1.765 m)     Weight: 84.5 kg (186 lb 4.6 oz)  82.2 kg (181 lb 3.5 oz)   SpO2:  99% 99% 100%    Wt Readings from Last 3 Encounters:  09/18/15 82.2 kg (181 lb 3.5 oz)     Intake/Output Summary (Last 24 hours) at 09/18/15 1519 Last data filed at 09/18/15 1410  Gross per 24 hour  Intake    240 ml  Output   4600 ml  Net  -4360 ml    Exam  General: Well developed, well nourished, NAD  HEENT: NCAT, mild Icteic Sclera, mucous membranes moist.   Cardiovascular: S1 S2 auscultated, RRR, no murmurs  Respiratory: Clear to auscultation bilaterally with equal chest rise  Abdomen: Soft, nontender, nondistended, + bowel sounds, drain in place   Extremities: warm dry without cyanosis clubbing or edema  Neuro: AAOx3, nonfocal  Psych: Anxious, however, appropriate  Data Review   Micro Results Recent Results (from the past 240 hour(s))  Body fluid culture     Status: None   Collection Time: 09/15/15 10:34 AM  Result Value Ref Range Status   Specimen Description PERITONEAL FLUID  Final   Special Requests NONE  Final   Gram Stain   Final    FEW WBC PRESENT, PREDOMINANTLY MONONUCLEAR NO ORGANISMS SEEN Results Called to: CALLED [place] [id number] SEVERAL TIMES NO ANSWER [phone] [place]    Culture   Final    NO GROWTH 3 DAYS Performed at [facility]    Report Status 09/18/2015 FINAL  Final  Culture, Urine     Status: None   Collection Time: 09/16/15 12:34 PM  Result Value Ref Range Status   Specimen Description URINE, CLEAN CATCH  Final   Special Requests NONE  Final   Culture   Final    NO GROWTH 1 DAY Performed at [facility]    Report Status 09/17/2015 FINAL  Final   Body fluid culture     Status: None (Preliminary result)   Collection Time: 09/17/15  5:12 PM  Result Value Ref Range Status   Specimen Description BILE  Final   Special Requests Normal  Final   Gram Stain   Final    RARE WBC PRESENT, PREDOMINANTLY MONONUCLEAR NO ORGANISMS SEEN    Culture   Final    RARE YEAST  CRITICAL RESULT CALLED TO, READ BACK BY AND VERIFIED WITH: R BALDWIN 09/18/15 @ 22 M VESTAL Performed at [facility]    Report Status PENDING  Incomplete    Radiology Reports Dg Chest 2 View  09/14/2015  CLINICAL DATA:  Bilateral ankle swelling. Abdominal distention. Back pain. Shallow breathing. EXAM: CHEST  2 VIEW COMPARISON:  None. FINDINGS: Poor inspiration. Normal sized heart. Minimal left lateral pleural thickening. No pleural fluid is seen posteriorly on either side. Small amount of linear density at the left lung base. Mild thoracic spine degenerative changes. IMPRESSION: Poor inspiration with a small amount of pleural and parenchymal scarring at the left lung base. Electronically Signed   By: Claudie Revering M.D.   On: 09/14/2015 19:20   Ct Chest W Contrast  09/15/2015  CLINICAL DATA:  Liver mass diagnosed on CT 1 day prior. Status post paracentesis. Fatigue. Inpatient. EXAM: CT CHEST WITH CONTRAST TECHNIQUE: Multidetector CT imaging of the chest was performed during intravenous contrast administration. CONTRAST:  40mL OMNIPAQUE IOHEXOL 300 MG/ML  SOLN COMPARISON:  09/14/2015 CT abdomen/ pelvis. Chest radiograph from 1 day prior. FINDINGS: Mediastinum/Nodes: Normal heart size. No pericardial fluid/thickening. Left anterior descending and left circumflex coronary atherosclerosis. Great vessels are normal in course and caliber. No central pulmonary emboli. Normal visualized thyroid. Large lower esophageal varices. No pathologically enlarged axillary, mediastinal or hilar lymph nodes. Lungs/Pleura: No pneumothorax. Trace left pleural effusion. Subsegmental atelectasis in  both lower lobes. No acute consolidative airspace disease, significant pulmonary nodules or lung masses. Upper abdomen: Re- demonstrated is cirrhosis and diffuse severe intrahepatic biliary ductal dilatation, with re- demonstration of central liver 4.7 cm mass extending superiorly and inferiorly along the central bile ducts. There are stable subcentimeter hypodense lesions in the left liver lobe and simple cysts in the right liver lobe. Small volume upper abdominal ascites, decreased. Fat stranding throughout the upper abdominal mesenteric and omental fat, unchanged. Stable dilated visualized proximal common bile duct. Stable mild upper retroperitoneal adenopathy. Musculoskeletal: Stable nondisplaced healing subacute lateral left eighth rib fracture. Mild-to-moderate degenerative changes in thoracic spine. IMPRESSION: 1. No evidence of metastatic disease in the chest. 2. Trace left pleural effusion. Mild bibasilar atelectasis. No acute consolidative airspace disease. 3. Three-vessel coronary atherosclerosis. 4. Stable nondisplaced healing subacute lateral left eighth rib fracture. 5. Re- demonstration of cirrhosis, central liver mass, severe diffuse intrahepatic biliary ductal dilatation, indeterminate subcentimeter hypodense left liver lobe lesions. Small volume upper abdominal ascites, decreased. Electronically Signed   By: Ilona Sorrel M.D.   On: 09/15/2015 13:47   Ct Abdomen Pelvis W Contrast  09/14/2015  CLINICAL DATA:  Abdominal distention. Back pain. Lower extremity swelling. EXAM: CT ABDOMEN AND PELVIS WITH CONTRAST TECHNIQUE: Multidetector CT imaging of the abdomen and pelvis was performed using the standard protocol following bolus administration of intravenous contrast. CONTRAST:  177mL OMNIPAQUE IOHEXOL 300 MG/ML  SOLN COMPARISON:  None. FINDINGS: Lower chest: No significant pulmonary nodules or acute consolidative airspace disease. Subsegmental atelectasis in the basilar lower lobes. Hepatobiliary:  The liver surface is diffusely prominently irregular, and there is relative hypertrophy of the left liver lobe, in keeping with cirrhosis. There is severe diffuse intrahepatic biliary ductal dilatation. There is a lobulated 4.8 x 3.5 cm mass in the porta hepatis (series 2/ image 25), which demonstrates hypoenhancement relative to the liver parenchyma, and which demonstrates finger-like extensions into the right liver lobe along the biliary tree. 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 Simple appearing 1.9 cm liver cyst in  the posterior right liver lobe. Separate simple appearing 3.4 cm liver cyst in the posterior upper right liver lobe. There are at least 2 additional subcentimeter hypodense lesions in the left liver lobe, too small to characterize. Nondistended gallbladder with nonspecific mild diffuse gallbladder wall thickening. The common bile duct is dilated up to 14 mm in diameter proximally with smooth distal tapering. Pancreas: Normal, with no mass or duct dilation. Spleen: Normal size. No mass. Adrenals/Urinary Tract: Normal adrenals. Normal kidneys with no hydronephrosis and no renal mass. Normal bladder. Stomach/Bowel: Grossly normal stomach. Normal caliber small bowel with no small bowel wall thickening. Normal appendix. Normal large bowel with no diverticulosis, large bowel wall thickening or pericolonic fat stranding. Vascular/Lymphatic: Normal caliber abdominal aorta. Patent portal, splenic and renal veins. There are large esophageal varices. There are small paraumbilical varices. There are additional portosystemic varices in the left omentum, which appear to drain into the left pelvic deep veins. There are multiple mildly enlarged porta hepatis nodes, largest 1.4 cm (series 2/ image 30). Reproductive: Normal size prostate with nonspecific internal prostatic calcifications. Other: No pneumoperitoneum. Moderate to large volume ascites, which measures simple fluid  density. Nonspecific fat stranding throughout the greater omentum and mesentery. Musculoskeletal: No aggressive appearing focal osseous lesions. Subacute healing lateral left eighth rib fracture. Mild-to-moderate degenerative changes in the visualized thoracolumbar spine. IMPRESSION: 1. Cirrhosis. Lobulated 4.8 x 3.5 cm solid mass in the porta hepatis with finger-like extensions into the right liver lobe along the biliary tree. Severe diffuse intrahepatic biliary ductal dilatation. 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 MRI abdomen with and without intravenous contrast could be performed after paracentesis for further evaluation. Recommend GI consultation for ERCP and possible biliary decompression, which may require IR consultation for percutaneous biliary decompression. 2. Large volume ascites. 3. Large esophageal varices. Small paraumbilical varices. Additional portosystemic left omental varices. 4. Nonspecific mild porta hepatis lymphadenopathy. 5. Separate subcentimeter hypodense liver lesions are too small to characterize and could represent liver metastases. 6. Subacute lateral left eighth rib fracture. These results were called by telephone at the time of interpretation on 09/14/2015 at 7:38 pm to Dr. Lonia Skinner , who verbally acknowledged these results. Electronically Signed   By: Ilona Sorrel M.D.   On: 09/14/2015 19:40   Mr 3d Recon At Scanner  09/15/2015  CLINICAL DATA:  Cirrhosis with ascites, distal esophageal varices and lobulated mass in the porta hepatis causing biliary obstruction on CT. EXAM: MRI ABDOMEN WITHOUT AND WITH CONTRAST (INCLUDING MRCP) TECHNIQUE: Multiplanar multisequence MR imaging of the abdomen was performed both before and after the administration of intravenous contrast. Heavily T2-weighted images of the biliary and pancreatic ducts were obtained, and three-dimensional  MRCP images were rendered by post processing. CONTRAST:  15mL MULTIHANCE GADOBENATE DIMEGLUMINE 529 MG/ML IV SOLN COMPARISON:  Abdominal CT 09/14/2015. FINDINGS: Unfortunately, the study is mildly motion degraded due to the patient's inability to completely suspend respiration. Lower chest:  Small left-greater-than-right pleural effusions. Hepatobiliary: The liver contours are diffusely irregular consistent with cirrhosis. There are multiple siderotic nodules throughout the liver with diffuse suppression of hepatic signal on the in phase images consistent with hemosiderosis. There is a 1.6 cm lesion in the medial segment of the left hepatic lobe which demonstrates T1 shortening and low level enhancement following contrast. No other enhancing intrahepatic lesions are identified. There are several hepatic cysts, largest projecting from the dome of the right hepatic lobe, measuring 3.5 cm. As  demonstrated on CT, there is a tubular mass within the porta hepatis which appears intra biliary. This demonstrates intermediate T2 signal, low-level enhancement and measures up to 3.8 cm transverse and 6.3 cm in length. There is resulting marked intrahepatic biliary dilatation with the left hepatic duct measuring up to 1.7 cm. The distal common bile duct is normal in caliber. Pancreas: Mildly atrophied. No evidence of pancreatic mass or pancreatic ductal dilatation. Spleen: Normal in size without focal abnormality. Adrenals/Urinary Tract: Both adrenal glands appear normal. Small renal cysts are noted. There is no suspicious renal finding or hydronephrosis. Stomach/Bowel: No evidence of bowel wall thickening, distention or surrounding inflammatory change. Vascular/Lymphatic: Multiple mildly enlarged lymph nodes within the porta hepatis are again noted. 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 Other: Ascites has decreased in volume. No suspicious peritoneal  enhancement. Musculoskeletal: No acute or significant osseous findings. IMPRESSION: 1. 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 Tissue sampling and biliary decompression recommended. 2. Prominent nonspecific lymph nodes in the porta hepatis, likely related to chronic liver disease. No definite distant metastases. 3. Macronodular cirrhosis with multiple siderotic nodules. There is one T1 hyperintense nodule in the left lobe which demonstrates low level enhancement and could reflect a dysplastic nodule. 4. Decreased volume of ascites following paracentesis. Electronically Signed   By: Richardean Sale M.D.   On: 09/15/2015 13:52   US Paracentesis  09/15/2015  Ardis Rowan, PA-C     09/15/2015 11:37 AM Successful US guided paracentesis from LLQ. Yielded 6 liters of clear yellow fluid. No immediate complications. Pt tolerated well. Specimen was sent for labs. WENDY S BLAIR PA-C 09/15/2015 11:37 AM   Mr Abd W/wo Cm/mrcp  09/15/2015  CLINICAL DATA:  Cirrhosis with ascites, distal esophageal varices and lobulated mass in the porta hepatis causing biliary obstruction on CT. EXAM: MRI ABDOMEN WITHOUT AND WITH CONTRAST (INCLUDING MRCP) TECHNIQUE: Multiplanar multisequence MR imaging of the abdomen was performed both before and after the administration of intravenous contrast. Heavily T2-weighted images of the biliary and pancreatic ducts were obtained, and three-dimensional MRCP images were rendered by post processing. CONTRAST:  50mL MULTIHANCE GADOBENATE DIMEGLUMINE 529 MG/ML IV SOLN COMPARISON:  Abdominal CT 09/14/2015. FINDINGS: Unfortunately, the study is mildly motion degraded due to the patient's inability to completely suspend respiration. Lower chest:  Small left-greater-than-right pleural effusions. Hepatobiliary: The liver contours are diffusely irregular consistent with cirrhosis. There are multiple  siderotic nodules throughout the liver with diffuse suppression of hepatic signal on the in phase images consistent with hemosiderosis. There is a 1.6 cm lesion in the medial segment of the left hepatic lobe which demonstrates T1 shortening and low level enhancement following contrast. No other enhancing intrahepatic lesions are identified. There are several hepatic cysts, largest projecting from the dome of the right hepatic lobe, measuring 3.5 cm. As demonstrated on CT, there is a tubular mass within the porta hepatis which appears intra biliary. This demonstrates intermediate T2 signal, low-level enhancement and measures up to 3.8 cm transverse and 6.3 cm in length. There is resulting marked intrahepatic biliary dilatation with the left hepatic duct measuring up to 1.7 cm. The distal common bile duct is normal in caliber. Pancreas: Mildly atrophied. No evidence of pancreatic mass or pancreatic ductal dilatation. Spleen: Normal in size without focal abnormality. Adrenals/Urinary Tract: Both adrenal glands appear normal. Small renal cysts are noted. There is no suspicious renal finding  or hydronephrosis. Stomach/Bowel: No evidence of bowel wall thickening, distention or surrounding inflammatory change. Vascular/Lymphatic: Multiple mildly enlarged lymph nodes within the porta hepatis are again noted. 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 Other: Ascites has decreased in volume. No suspicious peritoneal enhancement. Musculoskeletal: No acute or significant osseous findings. IMPRESSION: 1. 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 Tissue sampling and biliary decompression recommended. 2. Prominent nonspecific lymph nodes in the porta hepatis, likely related to chronic liver disease. No definite distant metastases. 3. Macronodular cirrhosis with  multiple siderotic nodules. There is one T1 hyperintense nodule in the left lobe which demonstrates low level enhancement and could reflect a dysplastic nodule. 4. Decreased volume of ascites following paracentesis. Electronically Signed   By: Richardean Sale M.D.   On: 09/15/2015 13:52   Ir Int Lianne Cure Biliary Drain With Cholangiogram  09/17/2015  CLINICAL DATA:  Biliary obstruction EXAM: IR INT-EXT BILIARY DRAIN W/ CHOLANGIOGRAM; IR BALLOON DILATION OF BILIARY DUCT/AMPULLA FLUOROSCOPY TIME:  9 minutes and 6 seconds MEDICATIONS AND MEDICAL HISTORY: Versed 4 mg, Fentanyl 100 mcg. Additional Medications: None.  Patient was on Rocephin. ANESTHESIA/SEDATION: Moderate sedation time: 60 minutes CONTRAST:  15 cc Omnipaque 300 PROCEDURE: The procedure, risks, benefits, and alternatives were explained to the patient. Questions regarding the procedure were encouraged and answered. The patient understands and consents to the procedure. The upper abdominal region was prepped with Betadine in a sterile fashion, and a sterile drape was applied covering the operative field. A sterile gown and sterile gloves were used for the procedure. Under sonographic guidance, a Chiba needle was inserted into a left hepatic duct. Contrast was injected opacifying the biliary tree. The needle was removed over a 018 wire. The Accustick transitional dilator was advanced over the wire to the mid biliary tree. It could not be advanced into the central biliary tree secondary to a very resistant partial obstruction. An Amplatz wire was advanced through the transitional dilator. This was advanced into the central biliary tree. Again, the 6 [place] transitional dilator of the Accustick set could not be advanced across the Amplatz wire. A 4 French glide catheter was advanced across the Amplatz wire into the central biliary tree. The glide catheter was advanced over a Bentson wire into the duodenum then was again exchange for the Amplatz wire. Initial  attempts at dilating across the partial obstruction with serial dilators starting from 5 [place] were unsuccessful. A 5 French sheath was advanced over the Amplatz into the peripheral biliary tree a 4 French balloon was utilized to dilate the partial obstruction. The tract was then easily dilated to 8 [place] an 8 [place] biliary drain was then advanced over the Amplatz into the duodenum. Two additional sideholes were cut above the upper marker. It was looped and string fixed in the duodenum then sewn to the skin. The initial aspirate was sent for a cytology analysis. Contrast was injected. FINDINGS: Percutaneous cholangiography into the left the biliary tree demonstrates very limited opacification the the bile ducts. This was performed in an attempt to prevent sepsis. Final images demonstrate left internal external biliary drain placement with its tip coiled in the duodenum. Central left-sided ducts only were opacified by contrast. COMPLICATIONS: None IMPRESSION: Successful left internal external biliary drain placement. An 8 French device was placed secondary to a resistant biliary obstruction. Initial bile aspirate was sent for cytology. Electronically Signed   By: Arnell Sieving  Hoss M.D.   On: 09/17/2015 08:33   Ir Balloon Dilation Of Biliary Ducts/ampulla  09/17/2015  CLINICAL DATA:  Biliary obstruction EXAM: IR INT-EXT BILIARY DRAIN W/ CHOLANGIOGRAM; IR BALLOON DILATION OF BILIARY DUCT/AMPULLA FLUOROSCOPY TIME:  9 minutes and 6 seconds MEDICATIONS AND MEDICAL HISTORY: Versed 4 mg, Fentanyl 100 mcg. Additional Medications: None.  Patient was on Rocephin. ANESTHESIA/SEDATION: Moderate sedation time: 60 minutes CONTRAST:  15 cc Omnipaque 300 PROCEDURE: The procedure, risks, benefits, and alternatives were explained to the patient. Questions regarding the procedure were encouraged and answered. The patient understands and consents to the procedure. The upper abdominal region was prepped with Betadine in a sterile  fashion, and a sterile drape was applied covering the operative field. A sterile gown and sterile gloves were used for the procedure. Under sonographic guidance, a Chiba needle was inserted into a left hepatic duct. Contrast was injected opacifying the biliary tree. The needle was removed over a 018 wire. The Accustick transitional dilator was advanced over the wire to the mid biliary tree. It could not be advanced into the central biliary tree secondary to a very resistant partial obstruction. An Amplatz wire was advanced through the transitional dilator. This was advanced into the central biliary tree. Again, the 6 [place] transitional dilator of the Accustick set could not be advanced across the Amplatz wire. A 4 French glide catheter was advanced across the Amplatz wire into the central biliary tree. The glide catheter was advanced over a Bentson wire into the duodenum then was again exchange for the Amplatz wire. Initial attempts at dilating across the partial obstruction with serial dilators starting from 5 [place] were unsuccessful. A 5 French sheath was advanced over the Amplatz into the peripheral biliary tree a 4 French balloon was utilized to dilate the partial obstruction. The tract was then easily dilated to 8 [place] an 8 [place] biliary drain was then advanced over the Amplatz into the duodenum. Two additional sideholes were cut above the upper marker. It was looped and string fixed in the duodenum then sewn to the skin. The initial aspirate was sent for a cytology analysis. Contrast was injected. FINDINGS: Percutaneous cholangiography into the left the biliary tree demonstrates very limited opacification the the bile ducts. This was performed in an attempt to prevent sepsis. Final images demonstrate left internal external biliary drain placement with its tip coiled in the duodenum. Central left-sided ducts only were opacified by contrast. COMPLICATIONS: None IMPRESSION: Successful left internal external  biliary drain placement. An 8 French device was placed secondary to a resistant biliary obstruction. Initial bile aspirate was sent for cytology. Electronically Signed   By: Marybelle Killings M.D.   On: 09/17/2015 08:33    CBC  Recent Labs Lab 09/14/15 1628 09/15/15 0411 09/16/15 0430 09/17/15 0418 09/18/15 0400  WBC 6.7 6.1 5.5 7.0 13.4*  HGB 13.6 12.6* 12.8* 13.4 14.8  HCT 38.9* 35.5* 36.5* 37.6* 40.9  PLT 128* 106* 106* 119* 133*  MCV 103.7* 102.9* 102.5* 103.3* 102.8*  MCH 36.3* 36.5* 36.0* 36.8* 37.2*  MCHC 35.0 35.5 35.1 35.6 36.2*  RDW 14.4 14.4 14.3 14.2 14.1    Chemistries   Recent Labs Lab 09/14/15 1628 09/15/15 0411 09/16/15 0430 09/17/15 0418 09/18/15 0400  NA 136 134* 133* 132* 132*  K 4.2 4.2 4.2 4.1 4.1  CL 102 103 101 99* 92*  CO2 $Re'28 26 27 28 'MCa$ 32  GLUCOSE 98 97 108* 105* 107*  BUN $Re'12 13 12 16 19  'cKo$ CREATININE 0.69 0.52* 0.62  0.68 0.80  CALCIUM 8.7* 8.4* 8.2* 9.0 9.4  MG  --   --   --   --  1.9  AST 103* 107* 109* 91* 72*  ALT 48 48 50 46 43  ALKPHOS 221* 179* 177* 170* 171*  BILITOT 4.0* 5.9* 3.6* 3.6* 2.8*   ------------------------------------------------------------------------------------------------------------------ estimated creatinine clearance is 102.4 mL/min (by C-G formula based on Cr of 0.8). ------------------------------------------------------------------------------------------------------------------ No results for input(s): HGBA1C in the last 72 hours. ------------------------------------------------------------------------------------------------------------------ No results for input(s): CHOL, HDL, LDLCALC, TRIG, CHOLHDL, LDLDIRECT in the last 72 hours. ------------------------------------------------------------------------------------------------------------------ No results for input(s): TSH, T4TOTAL, T3FREE, THYROIDAB in the last 72 hours.  Invalid input(s):  FREET3 ------------------------------------------------------------------------------------------------------------------ No results for input(s): VITAMINB12, FOLATE, FERRITIN, TIBC, IRON, RETICCTPCT in the last 72 hours.  Coagulation profile  Recent Labs Lab 09/15/15 1208 09/18/15 0400  INR 1.57* 1.48    No results for input(s): DDIMER in the last 72 hours.  Cardiac Enzymes No results for input(s): CKMB, TROPONINI, MYOGLOBIN in the last 168 hours.  Invalid input(s): CK ------------------------------------------------------------------------------------------------------------------ Invalid input(s): POCBNP    Eric Schroeder, [place] D.O. on 09/18/2015 at 3:19 PM  Between 7am to 7pm - Pager - [phone]  After 7pm go to [email or web address] - password [email or web address]  And look for the night coverage person covering for me after hours  [facility] Office  [phone]

## 2015-09-18 NOTE — Progress Notes (Signed)
Patient ID: Eric Schroeder, male   DOB: 1956-06-27, 59 y.o.   MRN: [id number]    Progress Note   Subjective   Having ongoing abdominal pain since procedure-requiring pain meds q 4 hours, says no appetite, cramping in thighs last night.   Drain output high-5200 yesterday Ascitic fluid cytology- reactive mesothelial calls, no malignancy  BIliary drainage fluid cytology-still pending ANA negative, AMA +    Objective   Vital signs in last 24 hours: Temp:  [98.1 F (36.7 C)-98.4 F (36.9 C)] 98.4 F (36.9 C) (12/07 0611) Pulse Rate:  [94-104] 104 (12/07 0611) Resp:  [18-20] 20 (12/07 0611) BP: (121-129)/(79-84) 121/79 mmHg (12/07 0611) SpO2:  [99 %] 99 % (12/07 0611) Weight:  [181 lb 3.5 oz (82.2 kg)-186 lb 4.6 oz (84.5 kg)] 181 lb 3.5 oz (82.2 kg) (12/07 [id number]) Last BM Date: 09/17/15 General:    white male in NAD Heart:  Regular rate and rhythm; no murmurs Lungs: Respirations even and unlabored, lungs CTA bilaterally Abdomen:  Soft, tender upper abdomen around drain site and nondistended, nontense ascites. Normal bowel sounds. Extremities:  Without edema. Neurologic:  Alert and oriented,  grossly normal neurologically. Psych:  Cooperative. Normal mood and affect.  Intake/Output from previous day: 12/06 0701 - 12/07 0700 In: 620 [P.O.:620] Out: 5250 [Drains:5250] Intake/Output this shift: Total I/O In: -  Out: 325 [Drains:325]  Lab Results:  Recent Labs  09/16/15 0430 09/17/15 0418 09/18/15 0400  WBC 5.5 7.0 13.4*  HGB 12.8* 13.4 14.8  HCT 36.5* 37.6* 40.9  PLT 106* 119* 133*   BMET  Recent Labs  09/16/15 0430 09/17/15 0418 09/18/15 0400  NA 133* 132* 132*  K 4.2 4.1 4.1  CL 101 99* 92*  CO2 27 28 32  GLUCOSE 108* 105* 107*  BUN 12 16 19   CREATININE 0.62 0.68 0.80  CALCIUM 8.2* 9.0 9.4   LFT  Recent Labs  09/18/15 0400  PROT 7.5  ALBUMIN 2.2*  AST 72*  ALT 43  ALKPHOS 171*  BILITOT 2.8*   PT/INR  Recent Labs  09/15/15 1208 09/18/15 0400   LABPROT 18.8* 18.0*  INR 1.57* 1.48    Studies/Results: Ir Int Ext Biliary Drain With Cholangiogram  09/17/2015  CLINICAL DATA:  Biliary obstruction EXAM: IR INT-EXT BILIARY DRAIN W/ CHOLANGIOGRAM; IR BALLOON DILATION OF BILIARY DUCT/AMPULLA FLUOROSCOPY TIME:  9 minutes and 6 seconds MEDICATIONS AND MEDICAL HISTORY: Versed 4 mg, Fentanyl 100 mcg. Additional Medications: None.  Patient was on Rocephin. ANESTHESIA/SEDATION: Moderate sedation time: 60 minutes CONTRAST:  15 cc Omnipaque 300 PROCEDURE: The procedure, risks, benefits, and alternatives were explained to the patient. Questions regarding the procedure were encouraged and answered. The patient understands and consents to the procedure. The upper abdominal region was prepped with Betadine in a sterile fashion, and a sterile drape was applied covering the operative field. A sterile gown and sterile gloves were used for the procedure. Under sonographic guidance, a Chiba needle was inserted into a left hepatic duct. Contrast was injected opacifying the biliary tree. The needle was removed over a 018 wire. The Accustick transitional dilator was advanced over the wire to the mid biliary tree. It could not be advanced into the central biliary tree secondary to a very resistant partial obstruction. An Amplatz wire was advanced through the transitional dilator. This was advanced into the central biliary tree. Again, the 6 [place] transitional dilator of the Accustick set could not be advanced across the Amplatz wire. A 4 French glide catheter was advanced  across the Amplatz wire into the central biliary tree. The glide catheter was advanced over a Bentson wire into the duodenum then was again exchange for the Amplatz wire. Initial attempts at dilating across the partial obstruction with serial dilators starting from 5 [place] were unsuccessful. A 5 French sheath was advanced over the Amplatz into the peripheral biliary tree a 4 French balloon was utilized to  dilate the partial obstruction. The tract was then easily dilated to 8 [place] an 8 [place] biliary drain was then advanced over the Amplatz into the duodenum. Two additional sideholes were cut above the upper marker. It was looped and string fixed in the duodenum then sewn to the skin. The initial aspirate was sent for a cytology analysis. Contrast was injected. FINDINGS: Percutaneous cholangiography into the left the biliary tree demonstrates very limited opacification the the bile ducts. This was performed in an attempt to prevent sepsis. Final images demonstrate left internal external biliary drain placement with its tip coiled in the duodenum. Central left-sided ducts only were opacified by contrast. COMPLICATIONS: None IMPRESSION: Successful left internal external biliary drain placement. An 8 French device was placed secondary to a resistant biliary obstruction. Initial bile aspirate was sent for cytology. Electronically Signed   By: Marybelle Killings M.D.   On: 09/17/2015 08:33   Ir Balloon Dilation Of Biliary Ducts/ampulla  09/17/2015  CLINICAL DATA:  Biliary obstruction EXAM: IR INT-EXT BILIARY DRAIN W/ CHOLANGIOGRAM; IR BALLOON DILATION OF BILIARY DUCT/AMPULLA FLUOROSCOPY TIME:  9 minutes and 6 seconds MEDICATIONS AND MEDICAL HISTORY: Versed 4 mg, Fentanyl 100 mcg. Additional Medications: None.  Patient was on Rocephin. ANESTHESIA/SEDATION: Moderate sedation time: 60 minutes CONTRAST:  15 cc Omnipaque 300 PROCEDURE: The procedure, risks, benefits, and alternatives were explained to the patient. Questions regarding the procedure were encouraged and answered. The patient understands and consents to the procedure. The upper abdominal region was prepped with Betadine in a sterile fashion, and a sterile drape was applied covering the operative field. A sterile gown and sterile gloves were used for the procedure. Under sonographic guidance, a Chiba needle was inserted into a left hepatic duct. Contrast was injected  opacifying the biliary tree. The needle was removed over a 018 wire. The Accustick transitional dilator was advanced over the wire to the mid biliary tree. It could not be advanced into the central biliary tree secondary to a very resistant partial obstruction. An Amplatz wire was advanced through the transitional dilator. This was advanced into the central biliary tree. Again, the 6 [place] transitional dilator of the Accustick set could not be advanced across the Amplatz wire. A 4 French glide catheter was advanced across the Amplatz wire into the central biliary tree. The glide catheter was advanced over a Bentson wire into the duodenum then was again exchange for the Amplatz wire. Initial attempts at dilating across the partial obstruction with serial dilators starting from 5 [place] were unsuccessful. A 5 French sheath was advanced over the Amplatz into the peripheral biliary tree a 4 French balloon was utilized to dilate the partial obstruction. The tract was then easily dilated to 8 [place] an 8 [place] biliary drain was then advanced over the Amplatz into the duodenum. Two additional sideholes were cut above the upper marker. It was looped and string fixed in the duodenum then sewn to the skin. The initial aspirate was sent for a cytology analysis. Contrast was injected. FINDINGS: Percutaneous cholangiography into the left the biliary tree demonstrates very limited opacification the the bile ducts. This  was performed in an attempt to prevent sepsis. Final images demonstrate left internal external biliary drain placement with its tip coiled in the duodenum. Central left-sided ducts only were opacified by contrast. COMPLICATIONS: None IMPRESSION: Successful left internal external biliary drain placement. An 8 French device was placed secondary to a resistant biliary obstruction. Initial bile aspirate was sent for cytology. Electronically Signed   By: Marybelle Killings M.D.   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 ? developing cholangitis Will discuss broadening coverage-PCN allergic-?Primaxin Fluid cytology pending Ascitic fluid cytology negative Await IR decision regarding timing of next procedure  Pain control-will switch to oxycontin 10 mg q 12 hours Not eating-restart IV fluids #2 Extremity cramps-check Mg, gently hydrate-high volume output from biliary drain contributing. Primary service to further evaluate cramping #3 Cirrhosis /ascites-AMA positive, AMSA positive   Principal Problem:   Liver mass Active Problems:   Ascites   UTI (lower urinary tract infection)   Biliary obstruction due to malignant neoplasm   Hepatic cirrhosis ([place])    LOS: 4 days   Amy Esterwood  09/18/2015, 9:07 AM      Attending physician's note   I have taken an interval history, reviewed the chart and examined the patient. I agree with the Advanced Practitioner's note, impression and recommendations. Rising WBC and ongoing epigastric pain concerning for cholangitis. Bile cytology is pending. Culture bile tdoay. AMA and ASMA positive, suggesting PBC. Await IR follow up today.   Lucio Edward, MD [facility] [phone] Mon-Fri 8a-5p [phone] after 5p, weekends, holidays

## 2015-09-19 DIAGNOSIS — R17 Unspecified jaundice: Secondary | ICD-10-CM

## 2015-09-19 LAB — COMPREHENSIVE METABOLIC PANEL
ALBUMIN: 2.1 g/dL — AB (ref 3.5–5.0)
ALT: 34 U/L (ref 17–63)
ANION GAP: 6 (ref 5–15)
AST: 51 U/L — AB (ref 15–41)
Alkaline Phosphatase: 148 U/L — ABNORMAL HIGH (ref 38–126)
BILIRUBIN TOTAL: 3 mg/dL — AB (ref 0.3–1.2)
BUN: 19 mg/dL (ref 6–20)
CO2: 33 mmol/L — ABNORMAL HIGH (ref 22–32)
Calcium: 9 mg/dL (ref 8.9–10.3)
Chloride: 89 mmol/L — ABNORMAL LOW (ref 101–111)
Creatinine, Ser: 0.75 mg/dL (ref 0.61–1.24)
GFR calc Af Amer: >60 mL/min (ref >60)
Glucose, Bld: 116 mg/dL — ABNORMAL HIGH (ref 65–99)
POTASSIUM: 4 mmol/L (ref 3.5–5.1)
SODIUM: 128 mmol/L — AB (ref 135–145)
TOTAL PROTEIN: 7 g/dL (ref 6.5–8.1)

## 2015-09-19 LAB — CBC
HCT: 40.4 % (ref 39.0–52.0)
Hemoglobin: 14.3 g/dL (ref 13.0–17.0)
MCH: 36.6 pg — AB (ref 26.0–34.0)
MCHC: 35.4 g/dL (ref 30.0–36.0)
MCV: 103.3 fL — ABNORMAL HIGH (ref 78.0–100.0)
PLATELETS: 125 10*3/uL — AB (ref 150–400)
RBC: 3.91 MIL/uL — AB (ref 4.22–5.81)
RDW: 14.1 % (ref 11.5–15.5)
WBC: 19.6 10*3/uL — AB (ref 4.0–10.5)

## 2015-09-19 LAB — PROTIME-INR
INR: 1.68 — ABNORMAL HIGH (ref 0.00–1.49)
PROTHROMBIN TIME: 19.8 s — AB (ref 11.6–15.2)

## 2015-09-19 LAB — GRAM STAIN: Gram Stain: NONE SEEN

## 2015-09-19 MED ORDER — SIMETHICONE 80 MG PO CHEW
160.0000 mg | CHEWABLE_TABLET | Freq: Once | ORAL | Status: AC
  Administered 2015-09-19: 160 mg via ORAL
  Filled 2015-09-19: qty 2

## 2015-09-19 MED ORDER — SODIUM CHLORIDE 0.9 % IV SOLN
INTRAVENOUS | Status: DC
  Administered 2015-09-19 – 2015-09-22 (×3): via INTRAVENOUS

## 2015-09-19 NOTE — Progress Notes (Signed)
Eric Schroeder   DOB:Jan 21, 1956   [id number]   [id number]  Subjective: Mrs. Eric Schroeder feels pretty good today. He denies any significant pain, bloating, or other discomfort. His be eating well. The. Drainage is going well with large volume output. He is scheduled to have brush biopsy and possibly internalization of his biliary drainage by IR tomorrow.   Objective:  Filed Vitals:   09/18/15 2320 09/19/15 0546  BP: 119/80 118/81  Pulse: 108 110  Temp: 98.5 F (36.9 C) 98.6 F (37 C)  Resp: 20 20    Body mass index is 25.94 kg/(m^2).  Intake/Output Summary (Last 24 hours) at 09/19/15 1507 Last data filed at 09/19/15 1431  Gross per 24 hour  Intake 1873.75 ml  Output   3610 ml  Net -1736.25 ml     Sclerae unicteric  Oropharynx clear  No peripheral adenopathy  Lungs clear -- no rales or rhonchi  Heart regular rate and rhythm  Abdomen slightly bloated, (+) bilary drainage tube in RUQ, (+) ascites   MSK no focal spinal tenderness, no peripheral edema  Neuro nonfocal   CBG (last 3)  No results for input(s): GLUCAP in the last 72 hours.   Labs:  Lab Results  Component Value Date   WBC 19.6* 09/19/2015   HGB 14.3 09/19/2015   HCT 40.4 09/19/2015   MCV 103.3* 09/19/2015   PLT 125* 09/19/2015    @LASTCHEMISTRY @  Urine Studies No results for input(s): UHGB, CRYS in the last 72 hours.  Invalid input(s): UACOL, UAPR, USPG, UPH, UTP, UGL, UKET, UBIL, UNIT, UROB, [place], UEPI, UWBC, Junie Panning [place], [place], [place]  Basic Metabolic Panel:  Recent Labs Lab 09/15/15 0411 09/16/15 0430 09/17/15 0418 09/18/15 0400 09/19/15 0355  NA 134* 133* 132* 132* 128*  K 4.2 4.2 4.1 4.1 4.0  CL 103 101 99* 92* 89*  CO2 26 27 28  32 33*  GLUCOSE 97 108* 105* 107* 116*  BUN 13 12 16 19 19   CREATININE 0.52* 0.62 0.68 0.80 0.75  CALCIUM 8.4* 8.2* 9.0 9.4 9.0  MG  --   --   --  1.9  --    GFR Estimated Creatinine Clearance: 102.4 mL/min (by C-G formula based on Cr of 0.75). Liver  Function Tests:  Recent Labs Lab 09/15/15 0411 09/16/15 0430 09/17/15 0418 09/18/15 0400 09/19/15 0355  AST 107* 109* 91* 72* 51*  ALT 48 50 46 43 34  ALKPHOS 179* 177* 170* 171* 148*  BILITOT 5.9* 3.6* 3.6* 2.8* 3.0*  PROT 6.2* 6.1* 6.6 7.5 7.0  ALBUMIN 2.0* 1.8* 2.1* 2.2* 2.1*    Recent Labs Lab 09/14/15 1830  LIPASE 31   No results for input(s): AMMONIA in the last 168 hours. Coagulation profile  Recent Labs Lab 09/15/15 1208 09/18/15 0400 09/19/15 0355  INR 1.57* 1.48 1.68*    CBC:  Recent Labs Lab 09/15/15 0411 09/16/15 0430 09/17/15 0418 09/18/15 0400 09/19/15 0355  WBC 6.1 5.5 7.0 13.4* 19.6*  HGB 12.6* 12.8* 13.4 14.8 14.3  HCT 35.5* 36.5* 37.6* 40.9 40.4  MCV 102.9* 102.5* 103.3* 102.8* 103.3*  PLT 106* 106* 119* 133* 125*   Cardiac Enzymes: No results for input(s): CKTOTAL, CKMB, CKMBINDEX, TROPONINI in the last 168 hours. BNP: Invalid input(s): POCBNP CBG: No results for input(s): GLUCAP in the last 168 hours. D-Dimer No results for input(s): DDIMER in the last 72 hours. Hgb A1c No results for input(s): HGBA1C in the last 72 hours. Lipid Profile No results for input(s): CHOL, HDL, LDLCALC,  TRIG, CHOLHDL, LDLDIRECT in the last 72 hours. Thyroid function studies No results for input(s): TSH, T4TOTAL, T3FREE, THYROIDAB in the last 72 hours.  Invalid input(s): FREET3 Anemia work up No results for input(s): VITAMINB12, FOLATE, FERRITIN, TIBC, IRON, RETICCTPCT in the last 72 hours. Microbiology Recent Results (from the past 240 hour(s))  Body fluid culture     Status: None   Collection Time: 09/15/15 10:34 AM  Result Value Ref Range Status   Specimen Description PERITONEAL FLUID  Final   Special Requests NONE  Final   Gram Stain   Final    FEW WBC PRESENT, PREDOMINANTLY MONONUCLEAR NO ORGANISMS SEEN Results Called to: CALLED [facility] [id number] SEVERAL TIMES NO ANSWER [id number] [place]    Culture   Final    NO GROWTH 3 DAYS Performed at [facility]    Report Status 09/18/2015 FINAL  Final  Culture, Urine     Status: None   Collection Time: 09/16/15 12:34 PM  Result Value Ref Range Status   Specimen Description URINE, CLEAN CATCH  Final   Special Requests NONE  Final   Culture   Final    NO GROWTH 1 DAY Performed at [facility]    Report Status 09/17/2015 FINAL  Final  Body fluid culture     Status: None (Preliminary result)   Collection Time: 09/17/15  5:12 PM  Result Value Ref Range Status   Specimen Description BILE  Final   Special Requests Normal  Final   Gram Stain   Final    RARE WBC PRESENT, PREDOMINANTLY MONONUCLEAR NO ORGANISMS SEEN    Culture   Final    RARE YEAST CRITICAL RESULT CALLED TO, READ BACK BY AND VERIFIED WITH: R BALDWIN 09/18/15 @ 60 M VESTAL Performed at [facility]    Report Status PENDING  Incomplete      Studies:  No results found.  Assessment: 59 y.o. male, without significant past medical history, not a heavy drinker, was found to have a liver mass, liver cirrhosis with ascites and esophageal varices.  1. 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 -We reviewed his images and discussed his case in our GI tumor Board yesterday morning. The tumor has direct invasion into the right hepatic artery, plus the underlying liver cirrhosis, it would be difficult to resect. Dr. Barry Schroeder does not think she can offer the surgery. I discussed surgical referral to another large cancer center, such as [facility], to see if surgical resection would be an option. I discussed that complete surgical resection is the only option for cure.  -he wants to wait until the definitive diagnosis is made before he decides about surgical consultation -I would offer him systemic  chemotherapy or radiation, if surgery is not feasible.  2. Liver cirrhosis, etiology unclear, with large volume ascites and esophageal versus. -He will follow-up with GI Dr. Fuller Schroeder  3. Obstructive jaundice, secondary to #1 -s/p percutaneous a biliary drainage, with large volume output -Hopefully it can be internalized, to avoid dehydration and reduce the risk of infection  I will follow up, and see him within one week of his discharge.     [name], MD 09/19/2015  3:07 PM

## 2015-09-19 NOTE — Progress Notes (Signed)
[facility]                                                                              HPI on 09/14/2015 by Dr. Ernestene Kiel Melynda Keller is a 59 y.o. male with no significant past medical history who comes to the emergency department with complaints of abdominal distention, abdominal pain, bilateral flank pain, lower extremity edema for about 2 weeks. This was preceded by progressively worse bloating, early satiety for most of this year. He also endorses a 35 pound weight loss during this timeframe as well. He states that he adjusted his eating habits to this, did not think that there was any major health issue on until his abdominal distention and lower extremity edema. He denies chest pain, palpitations, dizziness, diaphoresis, PND, but complains of dyspnea and orthopnea (he feels his "abdomen is pushing on his lungs"). He denies fever, chills, but feels fatigued.  Interim history Found to have liver mass.  IR placed drain. GI and Onc consulted. Pending further recommendations.  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 PBC -Hepatitis A antibody +, otherwise hepatitis panel unremarkable -Fluid culture shows some yeast so Fluconazole started along with Ceftriaxone -Cytology showed no malignant cells -Oncology consulted and appreciated  -S/p PTC by IR on 12/5 (output >3L).   -scheduled for possible Internalization of  stent and biliary duct burshings to determine if there is a malignancy on 12.9.16-d/w Dr. Deanne Coffer of IR  Elevated bilirubin, AST, INR -Likely secondary to the above -Patient given FFP earlier this admit -Continue to monitor CMP  Hyponatremia 2/2 to ongoing biliary losses Has been on d5 so switched to NS IV saline 75 cc/hr Labs am  Urinary tract infection -UA showed 0-5 WBC, positive nitrites leukocytes, few bacteria -Patient was started on ceftriaxone -Urine culture shows no growth to date (was not collected upon admission before antibiotics started)    Code Status: Full  Family Communication: d/w parents at bedside who understand  Disposition Plan: Admitted. Pending further recommendations from IR and Oncology  Time Spent in minutes   25 minutes  Procedures  US guided paracentesis MRCP PCT  Consults   Gastroenterology Interventional radiology Oncology   DVT Prophylaxis  SCDs  Lab Results  Component Value Date   PLT 125* 09/19/2015    Medications  Scheduled Meds: . cefTRIAXone (ROCEPHIN)  IV  1 g Intravenous Q24H  . feeding supplement (ENSURE ENLIVE)  237 mL Oral BID BM  . fluconazole (DIFLUCAN) IV  400 mg Intravenous Q24H  . oxyCODONE  10 mg Oral Q12H  . sodium chloride  3 mL Intravenous Q12H   Continuous Infusions: . dextrose 5 % and 0.45% NaCl 100 mL/hr at 09/19/15 1031   PRN Meds:.sodium chloride,  LORazepam, ondansetron **OR** ondansetron (ZOFRAN) IV, sodium chloride  Antibiotics    Anti-infectives    Start     Dose/Rate Route Frequency Ordered Stop   09/19/15 1600  fluconazole (DIFLUCAN) IVPB 400 mg     400 mg 100 mL/hr over 120 Minutes Intravenous Every 24 hours 09/18/15 1410     09/18/15 1500  fluconazole (DIFLUCAN) IVPB 800 mg     800 mg 200 mL/hr over 120 Minutes Intravenous  Once 09/18/15 1410 09/18/15 1716   09/15/15 2000  cefTRIAXone (ROCEPHIN) 1 g in dextrose 5 % 50 mL IVPB     1 g 100 mL/hr over 30 Minutes Intravenous Every 24 hours  09/14/15 2230     09/14/15 2045  cefTRIAXone (ROCEPHIN) 1 g in dextrose 5 % 50 mL IVPB     1 g 100 mL/hr over 30 Minutes Intravenous  Once 09/14/15 2031 09/14/15 2111      Subjective:   Well Feels bored No n/v/cp tol diet but appetite not back Slight bloating Pains much better   Objective:   Filed Vitals:   09/18/15 0611 09/18/15 1501 09/18/15 2320 09/19/15 0546  BP: 121/79 123/84 119/80 118/81  Pulse: 104 110 108 110  Temp: 98.4 F (36.9 C) 98.4 F (36.9 C) 98.5 F (36.9 C) 98.6 F (37 C)  TempSrc: Oral Oral Oral Oral  Resp: $Remo'20 20 20 20  'jvETj$ Height:      Weight: 82.2 kg (181 lb 3.5 oz)   80.8 kg (178 lb 2.1 oz)  SpO2: 99% 100% 100% 100%    Wt Readings from Last 3 Encounters:  09/19/15 80.8 kg (178 lb 2.1 oz)     Intake/Output Summary (Last 24 hours) at 09/19/15 1258 Last data filed at 09/19/15 1210  Gross per 24 hour  Intake 1873.75 ml  Output   3510 ml  Net -1636.25 ml    Exam  General: Well developed, well nourished, NAD  HEENT: NCAT, mild Icteic Sclera, mucous membranes moist.   Cardiovascular: S1 S2 auscultated, RRR, no murmurs  Respiratory: Clear to auscultation bilaterally with equal chest rise  Abdomen: Soft, nontender, nondistended, + bowel sounds, drain in place   Extremities: warm dry without cyanosis clubbing or edema  Neuro: AAOx3, nonfocal  Psych: Anxious, however, appropriate  Data Review   Micro Results Recent Results (from the past 240 hour(s))  Body fluid culture     Status: None   Collection Time: 09/15/15 10:34 AM  Result Value Ref Range Status   Specimen Description PERITONEAL FLUID  Final   Special Requests NONE  Final   Gram Stain   Final    FEW WBC PRESENT, PREDOMINANTLY MONONUCLEAR NO ORGANISMS SEEN Results Called to: CALLED [place] [id number] SEVERAL TIMES NO ANSWER [phone] [place]    Culture   Final    NO GROWTH 3 DAYS Performed at [facility]    Report Status 09/18/2015 FINAL  Final  Culture, Urine     Status:  None   Collection Time: 09/16/15 12:34 PM  Result Value Ref Range Status   Specimen Description URINE, CLEAN CATCH  Final   Special Requests NONE  Final   Culture   Final    NO GROWTH 1 DAY Performed at [facility]    Report Status 09/17/2015 FINAL  Final  Body fluid culture     Status: None (Preliminary result)   Collection Time: 09/17/15  5:12 PM  Result Value Ref Range Status   Specimen Description BILE  Final  Special Requests Normal  Final   Gram Stain   Final    RARE WBC PRESENT, PREDOMINANTLY MONONUCLEAR NO ORGANISMS SEEN    Culture   Final    RARE YEAST CRITICAL RESULT CALLED TO, READ BACK BY AND VERIFIED WITH: R BALDWIN 09/18/15 @ 60 M VESTAL Performed at [facility]    Report Status PENDING  Incomplete    Radiology Reports Dg Chest 2 View  09/14/2015  CLINICAL DATA:  Bilateral ankle swelling. Abdominal distention. Back pain. Shallow breathing. EXAM: CHEST  2 VIEW COMPARISON:  None. FINDINGS: Poor inspiration. Normal sized heart. Minimal left lateral pleural thickening. No pleural fluid is seen posteriorly on either side. Small amount of linear density at the left lung base. Mild thoracic spine degenerative changes. IMPRESSION: Poor inspiration with a small amount of pleural and parenchymal scarring at the left lung base. Electronically Signed   By: Claudie Revering M.D.   On: 09/14/2015 19:20   Ct Chest W Contrast  09/15/2015  CLINICAL DATA:  Liver mass diagnosed on CT 1 day prior. Status post paracentesis. Fatigue. Inpatient. EXAM: CT CHEST WITH CONTRAST TECHNIQUE: Multidetector CT imaging of the chest was performed during intravenous contrast administration. CONTRAST:  65mL OMNIPAQUE IOHEXOL 300 MG/ML  SOLN COMPARISON:  09/14/2015 CT abdomen/ pelvis. Chest radiograph from 1 day prior. FINDINGS: Mediastinum/Nodes: Normal heart size. No pericardial fluid/thickening. Left anterior descending and left circumflex coronary atherosclerosis. Great vessels are normal  in course and caliber. No central pulmonary emboli. Normal visualized thyroid. Large lower esophageal varices. No pathologically enlarged axillary, mediastinal or hilar lymph nodes. Lungs/Pleura: No pneumothorax. Trace left pleural effusion. Subsegmental atelectasis in both lower lobes. No acute consolidative airspace disease, significant pulmonary nodules or lung masses. Upper abdomen: Re- demonstrated is cirrhosis and diffuse severe intrahepatic biliary ductal dilatation, with re- demonstration of central liver 4.7 cm mass extending superiorly and inferiorly along the central bile ducts. There are stable subcentimeter hypodense lesions in the left liver lobe and simple cysts in the right liver lobe. Small volume upper abdominal ascites, decreased. Fat stranding throughout the upper abdominal mesenteric and omental fat, unchanged. Stable dilated visualized proximal common bile duct. Stable mild upper retroperitoneal adenopathy. Musculoskeletal: Stable nondisplaced healing subacute lateral left eighth rib fracture. Mild-to-moderate degenerative changes in thoracic spine. IMPRESSION: 1. No evidence of metastatic disease in the chest. 2. Trace left pleural effusion. Mild bibasilar atelectasis. No acute consolidative airspace disease. 3. Three-vessel coronary atherosclerosis. 4. Stable nondisplaced healing subacute lateral left eighth rib fracture. 5. Re- demonstration of cirrhosis, central liver mass, severe diffuse intrahepatic biliary ductal dilatation, indeterminate subcentimeter hypodense left liver lobe lesions. Small volume upper abdominal ascites, decreased. Electronically Signed   By: Ilona Sorrel M.D.   On: 09/15/2015 13:47   Ct Abdomen Pelvis W Contrast  09/14/2015  CLINICAL DATA:  Abdominal distention. Back pain. Lower extremity swelling. EXAM: CT ABDOMEN AND PELVIS WITH CONTRAST TECHNIQUE: Multidetector CT imaging of the abdomen and pelvis was performed using the standard protocol following bolus  administration of intravenous contrast. CONTRAST:  166mL OMNIPAQUE IOHEXOL 300 MG/ML  SOLN COMPARISON:  None. FINDINGS: Lower chest: No significant pulmonary nodules or acute consolidative airspace disease. Subsegmental atelectasis in the basilar lower lobes. Hepatobiliary: The liver surface is diffusely prominently irregular, and there is relative hypertrophy of the left liver lobe, in keeping with cirrhosis. There is severe diffuse intrahepatic biliary ductal dilatation. There is a lobulated 4.8 x 3.5 cm mass in the porta hepatis (series 2/ image 25), which demonstrates hypoenhancement relative  to the liver parenchyma, and which demonstrates finger-like extensions into the right liver lobe along the biliary tree. 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 Simple appearing 1.9 cm liver cyst in the posterior right liver lobe. Separate simple appearing 3.4 cm liver cyst in the posterior upper right liver lobe. There are at least 2 additional subcentimeter hypodense lesions in the left liver lobe, too small to characterize. Nondistended gallbladder with nonspecific mild diffuse gallbladder wall thickening. The common bile duct is dilated up to 14 mm in diameter proximally with smooth distal tapering. Pancreas: Normal, with no mass or duct dilation. Spleen: Normal size. No mass. Adrenals/Urinary Tract: Normal adrenals. Normal kidneys with no hydronephrosis and no renal mass. Normal bladder. Stomach/Bowel: Grossly normal stomach. Normal caliber small bowel with no small bowel wall thickening. Normal appendix. Normal large bowel with no diverticulosis, large bowel wall thickening or pericolonic fat stranding. Vascular/Lymphatic: Normal caliber abdominal aorta. Patent portal, splenic and renal veins. There are large esophageal varices. There are small paraumbilical varices. There are additional portosystemic varices in the left omentum, which appear to drain into the left pelvic deep  veins. There are multiple mildly enlarged porta hepatis nodes, largest 1.4 cm (series 2/ image 30). Reproductive: Normal size prostate with nonspecific internal prostatic calcifications. Other: No pneumoperitoneum. Moderate to large volume ascites, which measures simple fluid density. Nonspecific fat stranding throughout the greater omentum and mesentery. Musculoskeletal: No aggressive appearing focal osseous lesions. Subacute healing lateral left eighth rib fracture. Mild-to-moderate degenerative changes in the visualized thoracolumbar spine. IMPRESSION: 1. Cirrhosis. Lobulated 4.8 x 3.5 cm solid mass in the porta hepatis with finger-like extensions into the right liver lobe along the biliary tree. Severe diffuse intrahepatic biliary ductal dilatation. 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 MRI abdomen with and without intravenous contrast could be performed after paracentesis for further evaluation. Recommend GI consultation for ERCP and possible biliary decompression, which may require IR consultation for percutaneous biliary decompression. 2. Large volume ascites. 3. Large esophageal varices. Small paraumbilical varices. Additional portosystemic left omental varices. 4. Nonspecific mild porta hepatis lymphadenopathy. 5. Separate subcentimeter hypodense liver lesions are too small to characterize and could represent liver metastases. 6. Subacute lateral left eighth rib fracture. These results were called by telephone at the time of interpretation on 09/14/2015 at 7:38 pm to Dr. Lonia Skinner , who verbally acknowledged these results. Electronically Signed   By: Ilona Sorrel M.D.   On: 09/14/2015 19:40   Mr 3d Recon At Scanner  09/15/2015  CLINICAL DATA:  Cirrhosis with ascites, distal esophageal varices and lobulated mass in the porta hepatis causing biliary obstruction on CT. EXAM: MRI ABDOMEN WITHOUT AND  WITH CONTRAST (INCLUDING MRCP) TECHNIQUE: Multiplanar multisequence MR imaging of the abdomen was performed both before and after the administration of intravenous contrast. Heavily T2-weighted images of the biliary and pancreatic ducts were obtained, and three-dimensional MRCP images were rendered by post processing. CONTRAST:  16mL MULTIHANCE GADOBENATE DIMEGLUMINE 529 MG/ML IV SOLN COMPARISON:  Abdominal CT 09/14/2015. FINDINGS: Unfortunately, the study is mildly motion degraded due to the patient's inability to completely suspend respiration. Lower chest:  Small left-greater-than-right pleural effusions. Hepatobiliary: The liver contours are diffusely irregular consistent with cirrhosis. There are multiple siderotic nodules throughout the liver with diffuse suppression of hepatic signal on the in phase images consistent with hemosiderosis. There is a 1.6 cm lesion in the medial segment of the left hepatic lobe which  demonstrates T1 shortening and low level enhancement following contrast. No other enhancing intrahepatic lesions are identified. There are several hepatic cysts, largest projecting from the dome of the right hepatic lobe, measuring 3.5 cm. As demonstrated on CT, there is a tubular mass within the porta hepatis which appears intra biliary. This demonstrates intermediate T2 signal, low-level enhancement and measures up to 3.8 cm transverse and 6.3 cm in length. There is resulting marked intrahepatic biliary dilatation with the left hepatic duct measuring up to 1.7 cm. The distal common bile duct is normal in caliber. Pancreas: Mildly atrophied. No evidence of pancreatic mass or pancreatic ductal dilatation. Spleen: Normal in size without focal abnormality. Adrenals/Urinary Tract: Both adrenal glands appear normal. Small renal cysts are noted. There is no suspicious renal finding or hydronephrosis. Stomach/Bowel: No evidence of bowel wall thickening, distention or surrounding inflammatory change.  Vascular/Lymphatic: Multiple mildly enlarged lymph nodes within the porta hepatis are again noted. 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 Other: Ascites has decreased in volume. No suspicious peritoneal enhancement. Musculoskeletal: No acute or significant osseous findings. IMPRESSION: 1. 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 Tissue sampling and biliary decompression recommended. 2. Prominent nonspecific lymph nodes in the porta hepatis, likely related to chronic liver disease. No definite distant metastases. 3. Macronodular cirrhosis with multiple siderotic nodules. There is one T1 hyperintense nodule in the left lobe which demonstrates low level enhancement and could reflect a dysplastic nodule. 4. Decreased volume of ascites following paracentesis. Electronically Signed   By: Richardean Sale M.D.   On: 09/15/2015 13:52   US Paracentesis  09/15/2015  Ardis Rowan, PA-C     09/15/2015 11:37 AM Successful US guided paracentesis from LLQ. Yielded 6 liters of clear yellow fluid. No immediate complications. Pt tolerated well. Specimen was sent for labs. WENDY S BLAIR PA-C 09/15/2015 11:37 AM   Mr Abd W/wo Cm/mrcp  09/15/2015  CLINICAL DATA:  Cirrhosis with ascites, distal esophageal varices and lobulated mass in the porta hepatis causing biliary obstruction on CT. EXAM: MRI ABDOMEN WITHOUT AND WITH CONTRAST (INCLUDING MRCP) TECHNIQUE: Multiplanar multisequence MR imaging of the abdomen was performed both before and after the administration of intravenous contrast. Heavily T2-weighted images of the biliary and pancreatic ducts were obtained, and three-dimensional MRCP images were rendered by post processing. CONTRAST:  25mL MULTIHANCE GADOBENATE DIMEGLUMINE 529 MG/ML IV SOLN COMPARISON:  Abdominal CT 09/14/2015. FINDINGS: Unfortunately,  the study is mildly motion degraded due to the patient's inability to completely suspend respiration. Lower chest:  Small left-greater-than-right pleural effusions. Hepatobiliary: The liver contours are diffusely irregular consistent with cirrhosis. There are multiple siderotic nodules throughout the liver with diffuse suppression of hepatic signal on the in phase images consistent with hemosiderosis. There is a 1.6 cm lesion in the medial segment of the left hepatic lobe which demonstrates T1 shortening and low level enhancement following contrast. No other enhancing intrahepatic lesions are identified. There are several hepatic cysts, largest projecting from the dome of the right hepatic lobe, measuring 3.5 cm. As demonstrated on CT, there is a tubular mass within the porta hepatis which appears intra biliary. This demonstrates intermediate T2 signal, low-level enhancement and measures up to 3.8 cm transverse and 6.3 cm in length. There is resulting marked intrahepatic biliary dilatation with the left hepatic duct measuring up to 1.7 cm. The distal common bile duct is normal in caliber. Pancreas: Mildly  atrophied. No evidence of pancreatic mass or pancreatic ductal dilatation. Spleen: Normal in size without focal abnormality. Adrenals/Urinary Tract: Both adrenal glands appear normal. Small renal cysts are noted. There is no suspicious renal finding or hydronephrosis. Stomach/Bowel: No evidence of bowel wall thickening, distention or surrounding inflammatory change. Vascular/Lymphatic: Multiple mildly enlarged lymph nodes within the porta hepatis are again noted. 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 Other: Ascites has decreased in volume. No suspicious peritoneal enhancement. Musculoskeletal: No acute or significant osseous findings. IMPRESSION: 1. MRI confirms the suspicion of a large tubular intraductal mass causing biliary obstruction, most  likely secondary to hilar cholangiocarcinoma. Tissue sampling and biliary decompression recommended. 2. Prominent nonspecific lymph nodes in the porta hepatis, likely related to chronic liver disease. No definite distant metastases. 3. Macronodular cirrhosis with multiple siderotic nodules. There is one T1 hyperintense nodule in the left lobe which demonstrates low level enhancement and could reflect a dysplastic nodule. 4. Decreased volume of ascites following paracentesis. Electronically Signed   By: Richardean Sale M.D.   On: 09/15/2015 13:52   Ir Int Lianne Cure Biliary Drain With Cholangiogram  09/17/2015  CLINICAL DATA:  Biliary obstruction EXAM: IR INT-EXT BILIARY DRAIN W/ CHOLANGIOGRAM; IR BALLOON DILATION OF BILIARY DUCT/AMPULLA FLUOROSCOPY TIME:  9 minutes and 6 seconds MEDICATIONS AND MEDICAL HISTORY: Versed 4 mg, Fentanyl 100 mcg. Additional Medications: None.  Patient was on Rocephin. ANESTHESIA/SEDATION: Moderate sedation time: 60 minutes CONTRAST:  15 cc Omnipaque 300 PROCEDURE: The procedure, risks, benefits, and alternatives were explained to the patient. Questions regarding the procedure were encouraged and answered. The patient understands and consents to the procedure. The upper abdominal region was prepped with Betadine in a sterile fashion, and a sterile drape was applied covering the operative field. A sterile gown and sterile gloves were used for the procedure. Under sonographic guidance, a Chiba needle was inserted into a left hepatic duct. Contrast was injected opacifying the biliary tree. The needle was removed over a 018 wire. The Accustick transitional dilator was advanced over the wire to the mid biliary tree. It could not be advanced into the central biliary tree secondary to a very resistant partial obstruction. An Amplatz wire was advanced through the transitional dilator. This was advanced into the central biliary tree. Again, the 6 [place] transitional dilator of the Accustick set could  not be advanced across the Amplatz wire. A 4 French glide catheter was advanced across the Amplatz wire into the central biliary tree. The glide catheter was advanced over a Bentson wire into the duodenum then was again exchange for the Amplatz wire. Initial attempts at dilating across the partial obstruction with serial dilators starting from 5 [place] were unsuccessful. A 5 French sheath was advanced over the Amplatz into the peripheral biliary tree a 4 French balloon was utilized to dilate the partial obstruction. The tract was then easily dilated to 8 [place] an 8 [place] biliary drain was then advanced over the Amplatz into the duodenum. Two additional sideholes were cut above the upper marker. It was looped and string fixed in the duodenum then sewn to the skin. The initial aspirate was sent for a cytology analysis. Contrast was injected. FINDINGS: Percutaneous cholangiography into the left the biliary tree demonstrates very limited opacification the the bile ducts. This was performed in an attempt to prevent sepsis. Final images demonstrate left internal external biliary drain placement with its tip coiled in the duodenum. Central left-sided ducts only were opacified by contrast. COMPLICATIONS:  None IMPRESSION: Successful left internal external biliary drain placement. An 8 French device was placed secondary to a resistant biliary obstruction. Initial bile aspirate was sent for cytology. Electronically Signed   By: Marybelle Killings M.D.   On: 09/17/2015 08:33   Ir Balloon Dilation Of Biliary Ducts/ampulla  09/17/2015  CLINICAL DATA:  Biliary obstruction EXAM: IR INT-EXT BILIARY DRAIN W/ CHOLANGIOGRAM; IR BALLOON DILATION OF BILIARY DUCT/AMPULLA FLUOROSCOPY TIME:  9 minutes and 6 seconds MEDICATIONS AND MEDICAL HISTORY: Versed 4 mg, Fentanyl 100 mcg. Additional Medications: None.  Patient was on Rocephin. ANESTHESIA/SEDATION: Moderate sedation time: 60 minutes CONTRAST:  15 cc Omnipaque 300 PROCEDURE: The  procedure, risks, benefits, and alternatives were explained to the patient. Questions regarding the procedure were encouraged and answered. The patient understands and consents to the procedure. The upper abdominal region was prepped with Betadine in a sterile fashion, and a sterile drape was applied covering the operative field. A sterile gown and sterile gloves were used for the procedure. Under sonographic guidance, a Chiba needle was inserted into a left hepatic duct. Contrast was injected opacifying the biliary tree. The needle was removed over a 018 wire. The Accustick transitional dilator was advanced over the wire to the mid biliary tree. It could not be advanced into the central biliary tree secondary to a very resistant partial obstruction. An Amplatz wire was advanced through the transitional dilator. This was advanced into the central biliary tree. Again, the 6 [place] transitional dilator of the Accustick set could not be advanced across the Amplatz wire. A 4 French glide catheter was advanced across the Amplatz wire into the central biliary tree. The glide catheter was advanced over a Bentson wire into the duodenum then was again exchange for the Amplatz wire. Initial attempts at dilating across the partial obstruction with serial dilators starting from 5 [place] were unsuccessful. A 5 French sheath was advanced over the Amplatz into the peripheral biliary tree a 4 French balloon was utilized to dilate the partial obstruction. The tract was then easily dilated to 8 [place] an 8 [place] biliary drain was then advanced over the Amplatz into the duodenum. Two additional sideholes were cut above the upper marker. It was looped and string fixed in the duodenum then sewn to the skin. The initial aspirate was sent for a cytology analysis. Contrast was injected. FINDINGS: Percutaneous cholangiography into the left the biliary tree demonstrates very limited opacification the the bile ducts. This was performed in an  attempt to prevent sepsis. Final images demonstrate left internal external biliary drain placement with its tip coiled in the duodenum. Central left-sided ducts only were opacified by contrast. COMPLICATIONS: None IMPRESSION: Successful left internal external biliary drain placement. An 8 French device was placed secondary to a resistant biliary obstruction. Initial bile aspirate was sent for cytology. Electronically Signed   By: Marybelle Killings M.D.   On: 09/17/2015 08:33    CBC  Recent Labs Lab 09/15/15 0411 09/16/15 0430 09/17/15 0418 09/18/15 0400 09/19/15 0355  WBC 6.1 5.5 7.0 13.4* 19.6*  HGB 12.6* 12.8* 13.4 14.8 14.3  HCT 35.5* 36.5* 37.6* 40.9 40.4  PLT 106* 106* 119* 133* 125*  MCV 102.9* 102.5* 103.3* 102.8* 103.3*  MCH 36.5* 36.0* 36.8* 37.2* 36.6*  MCHC 35.5 35.1 35.6 36.2* 35.4  RDW 14.4 14.3 14.2 14.1 14.1    Chemistries   Recent Labs Lab 09/15/15 0411 09/16/15 0430 09/17/15 0418 09/18/15 0400 09/19/15 0355  NA 134* 133* 132* 132* 128*  K 4.2 4.2 4.1  4.1 4.0  CL 103 101 99* 92* 89*  CO2 $Re'26 27 28 'BPJ$ 32 33*  GLUCOSE 97 108* 105* 107* 116*  BUN $Re'13 12 16 19 19  'kbq$ CREATININE 0.52* 0.62 0.68 0.80 0.75  CALCIUM 8.4* 8.2* 9.0 9.4 9.0  MG  --   --   --  1.9  --   AST 107* 109* 91* 72* 51*  ALT 48 50 46 43 34  ALKPHOS 179* 177* 170* 171* 148*  BILITOT 5.9* 3.6* 3.6* 2.8* 3.0*   ------------------------------------------------------------------------------------------------------------------ estimated creatinine clearance is 102.4 mL/min (by C-G formula based on Cr of 0.75). ------------------------------------------------------------------------------------------------------------------ No results for input(s): HGBA1C in the last 72 hours. ------------------------------------------------------------------------------------------------------------------ No results for input(s): CHOL, HDL, LDLCALC, TRIG, CHOLHDL, LDLDIRECT in the last 72  hours. ------------------------------------------------------------------------------------------------------------------ No results for input(s): TSH, T4TOTAL, T3FREE, THYROIDAB in the last 72 hours.  Invalid input(s): FREET3 ------------------------------------------------------------------------------------------------------------------ No results for input(s): VITAMINB12, FOLATE, FERRITIN, TIBC, IRON, RETICCTPCT in the last 72 hours.  Coagulation profile  Recent Labs Lab 09/15/15 1208 09/18/15 0400 09/19/15 0355  INR 1.57* 1.48 1.68*    No results for input(s): DDIMER in the last 72 hours.  Cardiac Enzymes No results for input(s): CKMB, TROPONINI, MYOGLOBIN in the last 168 hours.  Invalid input(s): CK ------------------------------------------------------------------------------------------------------------------ Invalid input(s): POCBNP    Vahan Wadsworth, Murray D.O. on 09/19/2015 at 12:58 PM  Between 7am to 7pm - Pager - [phone]  After 7pm go to [email or web address] - password [email or web address]  And look for the night coverage person covering for me after hours  [facility] Office  [phone]

## 2015-09-19 NOTE — Progress Notes (Signed)
Referring Physician(s): GI- Dr. Fuller Plan  Chief Complaint: Biliary obstruction s/p perc biliary drain placed 12/5  Subjective: Patient states he feels really good today. He denies any abdominal pain, nausea, fever or chills. He is tolerating a diet. Last evening he complained of bloating and increased gas, but this has improved today.   Allergies: Review of patient's allergies indicates no known allergies.  Medications: Prior to Admission medications   Medication Sig Start Date End Date Taking? Authorizing Provider  Acetaminophen (TYLENOL PO) Take 2 tablets by mouth every 4 (four) hours as needed (pain).   Yes Historical Provider, MD   Vital Signs: BP 118/81 mmHg  Pulse 110  Temp(Src) 98.6 F (37 C) (Oral)  Resp 20  Ht 5' 9.5" (1.765 m)  Wt 178 lb 2.1 oz (80.8 kg)  BMI 25.94 kg/m2  SpO2 100%  Physical Exam General: A&ox3, NAD, sitting up in bed Abd: ND, NT, soft, Biliary drain intact- NT, 300 cc bilious output in bag, over 3000 cc/24 hrs recorded  Imaging: Ir Int Lianne Cure Biliary Drain With Cholangiogram  09/17/2015  CLINICAL DATA:  Biliary obstruction EXAM: IR INT-EXT BILIARY DRAIN W/ CHOLANGIOGRAM; IR BALLOON DILATION OF BILIARY DUCT/AMPULLA FLUOROSCOPY TIME:  9 minutes and 6 seconds MEDICATIONS AND MEDICAL HISTORY: Versed 4 mg, Fentanyl 100 mcg. Additional Medications: None.  Patient was on Rocephin. ANESTHESIA/SEDATION: Moderate sedation time: 60 minutes CONTRAST:  15 cc Omnipaque 300 PROCEDURE: The procedure, risks, benefits, and alternatives were explained to the patient. Questions regarding the procedure were encouraged and answered. The patient understands and consents to the procedure. The upper abdominal region was prepped with Betadine in a sterile fashion, and a sterile drape was applied covering the operative field. A sterile gown and sterile gloves were used for the procedure. Under sonographic guidance, a Chiba needle was inserted into a left hepatic duct. Contrast was  injected opacifying the biliary tree. The needle was removed over a 018 wire. The Accustick transitional dilator was advanced over the wire to the mid biliary tree. It could not be advanced into the central biliary tree secondary to a very resistant partial obstruction. An Amplatz wire was advanced through the transitional dilator. This was advanced into the central biliary tree. Again, the 6 [place] transitional dilator of the Accustick set could not be advanced across the Amplatz wire. A 4 French glide catheter was advanced across the Amplatz wire into the central biliary tree. The glide catheter was advanced over a Bentson wire into the duodenum then was again exchange for the Amplatz wire. Initial attempts at dilating across the partial obstruction with serial dilators starting from 5 [place] were unsuccessful. A 5 French sheath was advanced over the Amplatz into the peripheral biliary tree a 4 French balloon was utilized to dilate the partial obstruction. The tract was then easily dilated to 8 [place] an 8 [place] biliary drain was then advanced over the Amplatz into the duodenum. Two additional sideholes were cut above the upper marker. It was looped and string fixed in the duodenum then sewn to the skin. The initial aspirate was sent for a cytology analysis. Contrast was injected. FINDINGS: Percutaneous cholangiography into the left the biliary tree demonstrates very limited opacification the the bile ducts. This was performed in an attempt to prevent sepsis. Final images demonstrate left internal external biliary drain placement with its tip coiled in the duodenum. Central left-sided ducts only were opacified by contrast. COMPLICATIONS: None IMPRESSION: Successful left internal external biliary drain placement. An 8 [place] device was  placed secondary to a resistant biliary obstruction. Initial bile aspirate was sent for cytology. Electronically Signed   By: Marybelle Killings M.D.   On: 09/17/2015 08:33   Ir Balloon  Dilation Of Biliary Ducts/ampulla  09/17/2015  CLINICAL DATA:  Biliary obstruction EXAM: IR INT-EXT BILIARY DRAIN W/ CHOLANGIOGRAM; IR BALLOON DILATION OF BILIARY DUCT/AMPULLA FLUOROSCOPY TIME:  9 minutes and 6 seconds MEDICATIONS AND MEDICAL HISTORY: Versed 4 mg, Fentanyl 100 mcg. Additional Medications: None.  Patient was on Rocephin. ANESTHESIA/SEDATION: Moderate sedation time: 60 minutes CONTRAST:  15 cc Omnipaque 300 PROCEDURE: The procedure, risks, benefits, and alternatives were explained to the patient. Questions regarding the procedure were encouraged and answered. The patient understands and consents to the procedure. The upper abdominal region was prepped with Betadine in a sterile fashion, and a sterile drape was applied covering the operative field. A sterile gown and sterile gloves were used for the procedure. Under sonographic guidance, a Chiba needle was inserted into a left hepatic duct. Contrast was injected opacifying the biliary tree. The needle was removed over a 018 wire. The Accustick transitional dilator was advanced over the wire to the mid biliary tree. It could not be advanced into the central biliary tree secondary to a very resistant partial obstruction. An Amplatz wire was advanced through the transitional dilator. This was advanced into the central biliary tree. Again, the 6 [place] transitional dilator of the Accustick set could not be advanced across the Amplatz wire. A 4 French glide catheter was advanced across the Amplatz wire into the central biliary tree. The glide catheter was advanced over a Bentson wire into the duodenum then was again exchange for the Amplatz wire. Initial attempts at dilating across the partial obstruction with serial dilators starting from 5 [place] were unsuccessful. A 5 French sheath was advanced over the Amplatz into the peripheral biliary tree a 4 French balloon was utilized to dilate the partial obstruction. The tract was then easily dilated to 8  [place] an 8 [place] biliary drain was then advanced over the Amplatz into the duodenum. Two additional sideholes were cut above the upper marker. It was looped and string fixed in the duodenum then sewn to the skin. The initial aspirate was sent for a cytology analysis. Contrast was injected. FINDINGS: Percutaneous cholangiography into the left the biliary tree demonstrates very limited opacification the the bile ducts. This was performed in an attempt to prevent sepsis. Final images demonstrate left internal external biliary drain placement with its tip coiled in the duodenum. Central left-sided ducts only were opacified by contrast. COMPLICATIONS: None IMPRESSION: Successful left internal external biliary drain placement. An 8 French device was placed secondary to a resistant biliary obstruction. Initial bile aspirate was sent for cytology. Electronically Signed   By: Marybelle Killings M.D.   On: 09/17/2015 08:33    Labs:  CBC:  Recent Labs  09/16/15 0430 09/17/15 0418 09/18/15 0400 09/19/15 0355  WBC 5.5 7.0 13.4* 19.6*  HGB 12.8* 13.4 14.8 14.3  HCT 36.5* 37.6* 40.9 40.4  PLT 106* 119* 133* 125*    COAGS:  Recent Labs  09/15/15 1208 09/18/15 0400 09/19/15 0355  INR 1.57* 1.48 1.68*  APTT 38*  --   --     BMP:  Recent Labs  09/16/15 0430 09/17/15 0418 09/18/15 0400 09/19/15 0355  NA 133* 132* 132* 128*  K 4.2 4.1 4.1 4.0  CL 101 99* 92* 89*  CO2 27 28 32 33*  GLUCOSE 108* 105* 107* 116*  BUN 12  16 19 19   CALCIUM 8.2* 9.0 9.4 9.0  CREATININE 0.62 0.68 0.80 0.75  GFRNONAA >60 >60 >60 >60  GFRAA >60 >60 >60 >60    LIVER FUNCTION TESTS:  Recent Labs  09/16/15 0430 09/17/15 0418 09/18/15 0400 09/19/15 0355  BILITOT 3.6* 3.6* 2.8* 3.0*  AST 109* 91* 72* 51*  ALT 50 46 43 34  ALKPHOS 177* 170* 171* 148*  PROT 6.1* 6.6 7.5 7.0  ALBUMIN 1.8* 2.1* 2.2* 2.1*    Assessment and Plan: Cirrhosis with ascites s/p paracentesis cytology no malignant cells  Biliary  obstruction s/p internal/external perc biliary drain placed 12/5, cytology no malignant cells Good output- monitor electrolytes closely, afebrile, wbc trend up 19.6 (13.4), T. Bili 3 (2.8), INR 1.68 (1.48) Will discuss with Radiologist, keep NPO after midnight for possible brush biopsy 12/9, will discuss timing of internalization Check am labs and temp Other plans per primary and GI   Signed: Hedy Jacob 09/19/2015, 1:58 PM   I spent a total of 15 Minutes at the the patient's bedside AND on the patient's hospital floor or unit, greater than 50% of which was counseling/coordinating care for biliary obstruction.

## 2015-09-19 NOTE — Progress Notes (Signed)
Patient ID: Eric Schroeder, male   DOB: Jun 05, 1956, 59 y.o.   MRN: [id number]    Progress Note   Subjective   Feels the "best I have felt", gas pains last night now resolved, pain under much better control, less nausea-still little appetite WBC up to 19.6 Biliary fluid culture + yeast-now on Diflucan  And Rocephin Biliary drain output 3300 yesterday   Objective   Vital signs in last 24 hours: Temp:  [98.4 F (36.9 C)-98.6 F (37 C)] 98.6 F (37 C) (12/08 0546) Pulse Rate:  [108-110] 110 (12/08 0546) Resp:  [20] 20 (12/08 0546) BP: (118-123)/(80-84) 118/81 mmHg (12/08 0546) SpO2:  [100 %] 100 % (12/08 0546) Weight:  [178 lb 2.1 oz (80.8 kg)] 178 lb 2.1 oz (80.8 kg) (12/08 0546) Last BM Date: 09/17/15 General:   WD WM in NAD Heart:  Regular rate and rhythm; no murmurs, sl tachy Lungs: Respirations even and unlabored, lungs CTA bilaterally Abdomen:  Soft, minimally tender and nondistended. Normal bowel sounds. Extremities:  Without edema. Neurologic:  Alert and oriented,  grossly normal neurologically. Psych:  Cooperative. Normal mood and affect.  Intake/Output from previous day: 12/07 0701 - 12/08 0700 In: 2113.8 [P.O.:480; I.V.:1433.8; IV Piggyback:200] Out: 3325 [Urine:250; Drains:3075] Intake/Output this shift:    Lab Results:  Recent Labs  09/17/15 0418 09/18/15 0400 09/19/15 0355  WBC 7.0 13.4* 19.6*  HGB 13.4 14.8 14.3  HCT 37.6* 40.9 40.4  PLT 119* 133* 125*   BMET  Recent Labs  09/17/15 0418 09/18/15 0400 09/19/15 0355  NA 132* 132* 128*  K 4.1 4.1 4.0  CL 99* 92* 89*  CO2 28 32 33*  GLUCOSE 105* 107* 116*  BUN 16 19 19   CREATININE 0.68 0.80 0.75  CALCIUM 9.0 9.4 9.0   LFT  Recent Labs  09/19/15 0355  PROT 7.0  ALBUMIN 2.1*  AST 51*  ALT 34  ALKPHOS 148*  BILITOT 3.0*   PT/INR  Recent Labs  09/18/15 0400 09/19/15 0355  LABPROT 18.0* 19.8*  INR 1.48 1.68*      Assessment / Plan:    #1 59 yo WM 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 drainage of right sytem, bx/brushings, and re-culture fluid. Await IR input regarding timing Will reculture fluid this am Continue Rocephin, Diflucan Continue IV fluids   #2 New dx cirrhosis/possible PBC-parameters stable, ascites not problematic at present  Principal Problem:   Liver mass Active Problems:   Ascites   UTI (lower urinary tract infection)   Biliary obstruction due to malignant neoplasm   Hepatic cirrhosis ([place])    LOS: 5 days   Amy Esterwood  09/19/2015, 9:04 AM     Attending physician's note   I have taken an interval history, reviewed the chart and examined the patient. I agree with the Advanced Practitioner's note, impression and recommendations. Awaiting IR for internalization, biliary stent placement, possible drainage of right system, biliary biopsy or brushing. Closely monitor volume and electrolytes with high biliary drain output and drifting Na and Cl. Continue Rocephin and Diflucan. Cirrhosis and ascites are stable. Oncology has been consulted-awaiting biopsy/brushing per IR.   Lucio Edward, MD [facility] [phone] Mon-Fri 8a-5p [phone] after 5p, weekends, holidays

## 2015-09-20 ENCOUNTER — Encounter (HOSPITAL_COMMUNITY): Payer: Self-pay | Admitting: Radiology

## 2015-09-20 ENCOUNTER — Inpatient Hospital Stay (HOSPITAL_COMMUNITY): Payer: Medicaid Other

## 2015-09-20 LAB — COMPREHENSIVE METABOLIC PANEL
ALBUMIN: 2 g/dL — AB (ref 3.5–5.0)
ALT: 31 U/L (ref 17–63)
ANION GAP: 7 (ref 5–15)
AST: 53 U/L — AB (ref 15–41)
Alkaline Phosphatase: 129 U/L — ABNORMAL HIGH (ref 38–126)
BILIRUBIN TOTAL: 3.8 mg/dL — AB (ref 0.3–1.2)
BUN: 31 mg/dL — ABNORMAL HIGH (ref 6–20)
CHLORIDE: 88 mmol/L — AB (ref 101–111)
CO2: 30 mmol/L (ref 22–32)
Calcium: 8.8 mg/dL — ABNORMAL LOW (ref 8.9–10.3)
Creatinine, Ser: 1.13 mg/dL (ref 0.61–1.24)
GFR calc Af Amer: >60 mL/min (ref >60)
GLUCOSE: 82 mg/dL (ref 65–99)
POTASSIUM: 4.4 mmol/L (ref 3.5–5.1)
Sodium: 125 mmol/L — ABNORMAL LOW (ref 135–145)
TOTAL PROTEIN: 6.7 g/dL (ref 6.5–8.1)

## 2015-09-20 LAB — PROTIME-INR
INR: 1.64 — AB (ref 0.00–1.49)
PROTHROMBIN TIME: 19.5 s — AB (ref 11.6–15.2)

## 2015-09-20 LAB — CBC
HEMATOCRIT: 39.7 % (ref 39.0–52.0)
HEMOGLOBIN: 14.1 g/dL (ref 13.0–17.0)
MCH: 36.4 pg — ABNORMAL HIGH (ref 26.0–34.0)
MCHC: 35.5 g/dL (ref 30.0–36.0)
MCV: 102.6 fL — AB (ref 78.0–100.0)
PLATELETS: 139 10*3/uL — AB (ref 150–400)
RBC: 3.87 MIL/uL — ABNORMAL LOW (ref 4.22–5.81)
RDW: 13.9 % (ref 11.5–15.5)
WBC: 17.8 10*3/uL — AB (ref 4.0–10.5)

## 2015-09-20 MED ORDER — PIPERACILLIN-TAZOBACTAM 3.375 G IVPB
3.3750 g | Freq: Three times a day (TID) | INTRAVENOUS | Status: DC
  Administered 2015-09-20 – 2015-09-22 (×7): 3.375 g via INTRAVENOUS
  Filled 2015-09-20 (×6): qty 50

## 2015-09-20 MED ORDER — LIDOCAINE HCL 1 % IJ SOLN
INTRAMUSCULAR | Status: AC
  Filled 2015-09-20: qty 20

## 2015-09-20 MED ORDER — MIDAZOLAM HCL 2 MG/2ML IJ SOLN
INTRAMUSCULAR | Status: AC
  Filled 2015-09-20: qty 6

## 2015-09-20 MED ORDER — FENTANYL CITRATE (PF) 100 MCG/2ML IJ SOLN
INTRAMUSCULAR | Status: AC
  Filled 2015-09-20: qty 4

## 2015-09-20 MED ORDER — IOHEXOL 300 MG/ML  SOLN
25.0000 mL | Freq: Once | INTRAMUSCULAR | Status: AC | PRN
  Administered 2015-09-20: 25 mL

## 2015-09-20 MED ORDER — MIDAZOLAM HCL 2 MG/2ML IJ SOLN
INTRAMUSCULAR | Status: AC | PRN
  Administered 2015-09-20: 0.5 mg via INTRAVENOUS
  Administered 2015-09-20: 1 mg via INTRAVENOUS

## 2015-09-20 MED ORDER — VANCOMYCIN HCL IN DEXTROSE 1-5 GM/200ML-% IV SOLN
1000.0000 mg | Freq: Two times a day (BID) | INTRAVENOUS | Status: DC
  Administered 2015-09-20 – 2015-09-22 (×5): 1000 mg via INTRAVENOUS
  Filled 2015-09-20 (×4): qty 200

## 2015-09-20 MED ORDER — FENTANYL CITRATE (PF) 100 MCG/2ML IJ SOLN
INTRAMUSCULAR | Status: AC | PRN
  Administered 2015-09-20: 50 ug via INTRAVENOUS
  Administered 2015-09-20: 25 ug via INTRAVENOUS

## 2015-09-20 NOTE — Progress Notes (Signed)
Patient ID: Eric Schroeder, male   DOB: 06/04/1956, 59 y.o.   MRN: [id number]    Referring Physician(s): Dr, [facility]  Chief Complaint: Biliary obstruction s/p perc biliary drain placed 12/5  Subjective: Patient states he feels good today. He denies any abdominal pain, nausea, fever or chills.  Allergies: Review of patient's allergies indicates no known allergies.  Medications: Prior to Admission medications   Medication Sig Start Date End Date Taking? Authorizing Provider  Acetaminophen (TYLENOL PO) Take 2 tablets by mouth every 4 (four) hours as needed (pain).   Yes Historical Provider, MD   Vital Signs: BP 119/70 mmHg  Pulse 101  Temp(Src) 99.2 F (37.3 C) (Oral)  Resp 18  Ht 5' 9.5" (1.765 m)  Wt 178 lb 2.1 oz (80.8 kg)  BMI 25.94 kg/m2  SpO2 100%  Physical Exam General: A&Ox3, NAD Abd: Soft, NT, ND, soft, Biliary drain intact- NT, 100 cc bilious output in bag, over 3000 cc/24 hrs recorded   Imaging: Ir Int Lianne Cure Biliary Drain With Cholangiogram  09/17/2015  CLINICAL DATA:  Biliary obstruction EXAM: IR INT-EXT BILIARY DRAIN W/ CHOLANGIOGRAM; IR BALLOON DILATION OF BILIARY DUCT/AMPULLA FLUOROSCOPY TIME:  9 minutes and 6 seconds MEDICATIONS AND MEDICAL HISTORY: Versed 4 mg, Fentanyl 100 mcg. Additional Medications: None.  Patient was on Rocephin. ANESTHESIA/SEDATION: Moderate sedation time: 60 minutes CONTRAST:  15 cc Omnipaque 300 PROCEDURE: The procedure, risks, benefits, and alternatives were explained to the patient. Questions regarding the procedure were encouraged and answered. The patient understands and consents to the procedure. The upper abdominal region was prepped with Betadine in a sterile fashion, and a sterile drape was applied covering the operative field. A sterile gown and sterile gloves were used for the procedure. Under sonographic guidance, a Chiba needle was inserted into a left hepatic duct. Contrast was injected opacifying the biliary tree. The needle was  removed over a 018 wire. The Accustick transitional dilator was advanced over the wire to the mid biliary tree. It could not be advanced into the central biliary tree secondary to a very resistant partial obstruction. An Amplatz wire was advanced through the transitional dilator. This was advanced into the central biliary tree. Again, the 6 [place] transitional dilator of the Accustick set could not be advanced across the Amplatz wire. A 4 French glide catheter was advanced across the Amplatz wire into the central biliary tree. The glide catheter was advanced over a Bentson wire into the duodenum then was again exchange for the Amplatz wire. Initial attempts at dilating across the partial obstruction with serial dilators starting from 5 [place] were unsuccessful. A 5 French sheath was advanced over the Amplatz into the peripheral biliary tree a 4 French balloon was utilized to dilate the partial obstruction. The tract was then easily dilated to 8 [place] an 8 [place] biliary drain was then advanced over the Amplatz into the duodenum. Two additional sideholes were cut above the upper marker. It was looped and string fixed in the duodenum then sewn to the skin. The initial aspirate was sent for a cytology analysis. Contrast was injected. FINDINGS: Percutaneous cholangiography into the left the biliary tree demonstrates very limited opacification the the bile ducts. This was performed in an attempt to prevent sepsis. Final images demonstrate left internal external biliary drain placement with its tip coiled in the duodenum. Central left-sided ducts only were opacified by contrast. COMPLICATIONS: None IMPRESSION: Successful left internal external biliary drain placement. An 8 French device was placed secondary to a resistant biliary  obstruction. Initial bile aspirate was sent for cytology. Electronically Signed   By: Marybelle Killings M.D.   On: 09/17/2015 08:33   Ir Balloon Dilation Of Biliary Ducts/ampulla  09/17/2015   CLINICAL DATA:  Biliary obstruction EXAM: IR INT-EXT BILIARY DRAIN W/ CHOLANGIOGRAM; IR BALLOON DILATION OF BILIARY DUCT/AMPULLA FLUOROSCOPY TIME:  9 minutes and 6 seconds MEDICATIONS AND MEDICAL HISTORY: Versed 4 mg, Fentanyl 100 mcg. Additional Medications: None.  Patient was on Rocephin. ANESTHESIA/SEDATION: Moderate sedation time: 60 minutes CONTRAST:  15 cc Omnipaque 300 PROCEDURE: The procedure, risks, benefits, and alternatives were explained to the patient. Questions regarding the procedure were encouraged and answered. The patient understands and consents to the procedure. The upper abdominal region was prepped with Betadine in a sterile fashion, and a sterile drape was applied covering the operative field. A sterile gown and sterile gloves were used for the procedure. Under sonographic guidance, a Chiba needle was inserted into a left hepatic duct. Contrast was injected opacifying the biliary tree. The needle was removed over a 018 wire. The Accustick transitional dilator was advanced over the wire to the mid biliary tree. It could not be advanced into the central biliary tree secondary to a very resistant partial obstruction. An Amplatz wire was advanced through the transitional dilator. This was advanced into the central biliary tree. Again, the 6 [place] transitional dilator of the Accustick set could not be advanced across the Amplatz wire. A 4 French glide catheter was advanced across the Amplatz wire into the central biliary tree. The glide catheter was advanced over a Bentson wire into the duodenum then was again exchange for the Amplatz wire. Initial attempts at dilating across the partial obstruction with serial dilators starting from 5 [place] were unsuccessful. A 5 French sheath was advanced over the Amplatz into the peripheral biliary tree a 4 French balloon was utilized to dilate the partial obstruction. The tract was then easily dilated to 8 [place] an 8 [place] biliary drain was then advanced  over the Amplatz into the duodenum. Two additional sideholes were cut above the upper marker. It was looped and string fixed in the duodenum then sewn to the skin. The initial aspirate was sent for a cytology analysis. Contrast was injected. FINDINGS: Percutaneous cholangiography into the left the biliary tree demonstrates very limited opacification the the bile ducts. This was performed in an attempt to prevent sepsis. Final images demonstrate left internal external biliary drain placement with its tip coiled in the duodenum. Central left-sided ducts only were opacified by contrast. COMPLICATIONS: None IMPRESSION: Successful left internal external biliary drain placement. An 8 French device was placed secondary to a resistant biliary obstruction. Initial bile aspirate was sent for cytology. Electronically Signed   By: Marybelle Killings M.D.   On: 09/17/2015 08:33    Labs:  CBC:  Recent Labs  09/17/15 0418 09/18/15 0400 09/19/15 0355 09/20/15 0530  WBC 7.0 13.4* 19.6* 17.8*  HGB 13.4 14.8 14.3 14.1  HCT 37.6* 40.9 40.4 39.7  PLT 119* 133* 125* 139*    COAGS:  Recent Labs  09/15/15 1208 09/18/15 0400 09/19/15 0355 09/20/15 0530  INR 1.57* 1.48 1.68* 1.64*  APTT 38*  --   --   --     BMP:  Recent Labs  09/17/15 0418 09/18/15 0400 09/19/15 0355 09/20/15 0530  NA 132* 132* 128* 125*  K 4.1 4.1 4.0 4.4  CL 99* 92* 89* 88*  CO2 28 32 33* 30  GLUCOSE 105* 107* 116* 82  BUN 16  19 19 31*  CALCIUM 9.0 9.4 9.0 8.8*  CREATININE 0.68 0.80 0.75 1.13  GFRNONAA >60 >60 >60 >60  GFRAA >60 >60 >60 >60    LIVER FUNCTION TESTS:  Recent Labs  09/17/15 0418 09/18/15 0400 09/19/15 0355 09/20/15 0530  BILITOT 3.6* 2.8* 3.0* 3.8*  AST 91* 72* 51* 53*  ALT 46 43 34 31  ALKPHOS 170* 171* 148* 129*  PROT 6.6 7.5 7.0 6.7  ALBUMIN 2.1* 2.2* 2.1* 2.0*    Assessment and Plan: Cirrhosis with ascites s/p paracentesis cytology no malignant cells  Biliary obstruction s/p  internal/external perc biliary drain placed 12/5, cytology no malignant cells Good output- monitor electrolytes closely, afebrile, wbc trend down 17 (19.6), T. Bili still up 3.8 (3), INR 1.64 Imaging and labs discussed with Dr. Kathlene Cote today will proceed with biliary brush biopsy, left biliary drain exchange, possible new right sided biliary drain placement and paracentesis. The patient is not an internal stent candidate at this time.  Other plans per primary and GI   Signed: Hedy Jacob 09/20/2015, 10:20 AM   I spent a total of 15 Minutes at the the patient's bedside AND on the patient's hospital floor or unit, greater than 50% of which was counseling/coordinating care for biliary obstruction.

## 2015-09-20 NOTE — Progress Notes (Signed)
Patient ID: Eric Schroeder, male   DOB: 1955/11/15, 59 y.o.   MRN: [id number]    Progress Note   Subjective   Feels ok- pain controlled, still no appetite IR planning drainage of right system today, and brushings Bile fluid culture from yesterday growing gram + rods  Drain outpt- 3800 yesterday   Objective   Vital signs in last 24 hours: Temp:  [98.4 F (36.9 C)-99.2 F (37.3 C)] 99.2 F (37.3 C) (12/09 0522) Pulse Rate:  [98-111] 101 (12/09 0522) Resp:  [18-20] 18 (12/09 0522) BP: (100-119)/(64-70) 119/70 mmHg (12/09 0522) SpO2:  [100 %] 100 % (12/09 0522) Weight:  [178 lb 2.1 oz (80.8 kg)] 178 lb 2.1 oz (80.8 kg) (12/09 0526) Last BM Date: 09/17/15 General:    white male  in NAD icteric Heart:  Regular rate and rhythm; no murmurs Lungs: Respirations even and unlabored, lungs CTA bilaterally Abdomen:  Soft, mildly tender and nondistended. Normal bowel sounds. Biliary drainage tube in epigastrium, no fluid wave Extremities:  Without edema. Neurologic:  Alert and oriented,  grossly normal neurologically. Psych:  Cooperative. Normal mood and affect.  Intake/Output from previous day: 12/08 0701 - 12/09 0700 In: 1705 [P.O.:720; I.V.:930; IV Piggyback:50] Out: 3810 [Urine:500; Drains:3310] Intake/Output this shift:    Lab Results:  Recent Labs  09/18/15 0400 09/19/15 0355 09/20/15 0530  WBC 13.4* 19.6* 17.8*  HGB 14.8 14.3 14.1  HCT 40.9 40.4 39.7  PLT 133* 125* 139*   BMET  Recent Labs  09/18/15 0400 09/19/15 0355 09/20/15 0530  NA 132* 128* 125*  K 4.1 4.0 4.4  CL 92* 89* 88*  CO2 32 33* 30  GLUCOSE 107* 116* 82  BUN 19 19 31*  CREATININE 0.80 0.75 1.13  CALCIUM 9.4 9.0 8.8*   LFT  Recent Labs  09/20/15 0530  PROT 6.7  ALBUMIN 2.0*  AST 53*  ALT 31  ALKPHOS 129*  BILITOT 3.8*   PT/INR  Recent Labs  09/19/15 0355 09/20/15 0530  LABPROT 19.8* 19.5*  INR 1.68* 1.64*      Assessment / Plan:    #1 59 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  They would like to pursue surgical consult at [facility] which was suggested by  Dr [facility] will sign off - we are available if needed. Eric Schroeder available for outpatient GI follow up as needed.   Principal Problem:   Liver mass Active Problems:   Ascites   UTI (lower urinary tract infection)   Biliary obstruction due to malignant neoplasm   Hepatic cirrhosis ([place])    LOS: 6 days   Eric Schroeder  09/20/2015, 9:57 AM     Attending physician's note   I have taken an interval history, reviewed the chart and examined the patient. I agree with the Advanced Practitioner's note, impression and recommendations. Bile growing gram + rods. Antibiotics now Zosyn, Vanco and Diflucan. Cirrhosis and ascites are stable. No further evaluation of cirrhosis at this time, possibly has PBC, given liver tumor and biliary  obstruction. For right biliary system drainage and tumor brushing today per IR. Further biliary mgmt per IR. Continue IV antibiotics. Oncology following, awaiting tissue diagnosis. GI available if needed. Eric Schroeder available for outpatient GI follow up as needed.   Eric Edward, MD [facility] [phone] Mon-Fri 8a-5p [phone] after 5p, weekends, holidays

## 2015-09-20 NOTE — Progress Notes (Addendum)
CRITICAL VALUE ALERT  Critical value received: +Bile* cultures in both Aerobic and Anaerobic bottles: Gram+rods.  Date of notification:  09/20/15  Time of notification:  0510  Critical value read back:yes  Nurse who received alert:Mathew Storck Jerelene Redden, RN  MD notified (1st page):  L. Harduk  Time of first page:  0511  MD notified (2nd page): n/a  Time of second page:n/a  Responding MD:  [place]  Time MD responded:  [id number]

## 2015-09-20 NOTE — Progress Notes (Signed)
ANTIBIOTIC CONSULT NOTE - INITIAL  Pharmacy Consult for Vancomycin, Zosyn Indication: biliary tract infection  No Known Allergies  Patient Measurements: Height: 5' 9.5" (176.5 cm) Weight: 178 lb 2.1 oz (80.8 kg) IBW/kg (Calculated) : 71.85  Vital Signs: Temp: 99.2 F (37.3 C) (12/09 0522) Temp Source: Oral (12/09 0522) BP: 119/70 mmHg (12/09 0522) Pulse Rate: 101 (12/09 0522) Intake/Output from previous day: 12/08 0701 - 12/09 0700 In: 9450 [P.O.:720; I.V.:930; IV Piggyback:50] Out: 3810 [Urine:500; Drains:3310] Intake/Output from this shift:    Labs:  Recent Labs  09/18/15 0400 09/19/15 0355 09/20/15 0530  WBC 13.4* 19.6* 17.8*  HGB 14.8 14.3 14.1  PLT 133* 125* 139*  CREATININE 0.80 0.75 1.13   Estimated Creatinine Clearance: 72.5 mL/min (by C-G formula based on Cr of 1.13). No results for input(s): VANCOTROUGH, VANCOPEAK, VANCORANDOM, GENTTROUGH, GENTPEAK, GENTRANDOM, TOBRATROUGH, TOBRAPEAK, TOBRARND, AMIKACINPEAK, AMIKACINTROU, AMIKACIN in the last 72 hours.   Microbiology: Recent Results (from the past 720 hour(s))  Body fluid culture     Status: None   Collection Time: 09/15/15 10:34 AM  Result Value Ref Range Status   Specimen Description PERITONEAL FLUID  Final   Special Requests NONE  Final   Gram Stain   Final    FEW WBC PRESENT, PREDOMINANTLY MONONUCLEAR NO ORGANISMS SEEN Results Called to: CALLED [facility] [id number] SEVERAL TIMES NO ANSWER [id number] [place]    Culture   Final    NO GROWTH 3 DAYS Performed at [facility]    Report Status 09/18/2015 FINAL  Final  Culture, Urine     Status: None   Collection Time: 09/16/15 12:34 PM  Result Value Ref Range Status   Specimen Description URINE, CLEAN CATCH  Final   Special Requests NONE  Final   Culture   Final    NO GROWTH 1 DAY Performed at [facility]    Report Status 09/17/2015 FINAL  Final  Body fluid culture     Status: None (Preliminary result)   Collection Time: 09/17/15  5:12 PM   Result Value Ref Range Status   Specimen Description BILE  Final   Special Requests Normal  Final   Gram Stain   Final    RARE WBC PRESENT, PREDOMINANTLY MONONUCLEAR NO ORGANISMS SEEN    Culture   Final    RARE YEAST CRITICAL RESULT CALLED TO, READ BACK BY AND VERIFIED WITH: R BALDWIN 09/18/15 @ 70 M VESTAL Performed at [facility]    Report Status PENDING  Incomplete  Culture, body fluid-bottle     Status: None (Preliminary result)   Collection Time: 09/19/15 11:53 AM  Result Value Ref Range Status   Specimen Description FLUID BILE  Final   Special Requests NONE Performed at [facility]   Final   Gram Stain   Final    GRAM POSITIVE RODS IN BOTH AEROBIC AND ANAEROBIC BOTTLES CRITICAL RESULT CALLED TO, READ BACK BY AND VERIFIED WITH: MEREDITH $[email or web address]  09/20/15 MKELLY    Culture PENDING  Incomplete   Report Status PENDING  Incomplete  Gram stain     Status: None   Collection Time: 09/19/15 11:53 AM  Result Value Ref Range Status   Specimen Description FLUID BILE  Final   Special Requests NONE  Final   Gram Stain   Final    NO WBC SEEN FEW GRAM POSITIVE RODS RARE GRAM NEGATIVE RODS RARE BUDDING YEAST SEEN Performed at [facility]    Report Status 09/19/2015 FINAL  Final  Medical History: History reviewed. No pertinent past medical history.   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 Started on ceftriaxone for UTI.  Bile cultures growing yeast and second collection gram stain showing GPR.  Coverage broadened today to Vancomycin and Zosyn.  Today, 09/20/2015: - WBC remains elevated - Tmax 99.2 - SCr increasing. CrCl~80 ml/min (CG), ~71 ml/min (normalized)  Antimicrobials this admission: 12/3 Ceftriaxone >> 12/9 12/7 Fluconazole >>  12/9 Zosyn >> 12/9 Vancomycin >>  Microbiology Results: 12/6 bile  fluid culture: rare yeast 12/8 bile fluid culture: pending, gram stain showing GPR 12/4 peritoneal fluid cultures: NGF 12/5 UCx: NGF  Goal of Therapy:  Vancomycin trough 15-20 mcg/mL Doses adjusted per renal function Eradication of infection  Plan:  1.  Vancomycin 1g IV q12h. 2.  Zosyn 3.375g IV q8h (4 hour infusion time).  3.  F/u SCr, trough levels as indicated, culture results.  Hershal Coria 09/20/2015,8:08 AM

## 2015-09-20 NOTE — Procedures (Signed)
Interventional Radiology Procedure Note  Procedure: Fluoro guided FNA of liver/biliary mass using the previously placed int/ext drain as target.  Quick-stain was negative.  3 additional FNA acquired with fluoro guidance.   Through the tube cholangiogram.  Exchange of previous 24F biliary int/ext for a new 57F biliary int/ext.  .  Complications: None.  Recommendations:  - F/U path result. - Would continue gravity drainage for now.  Record output.  - Routine wound and drain care   Signed,  Dulcy Fanny. Earleen Newport, DO

## 2015-09-20 NOTE — Progress Notes (Signed)
[facility]                                                                              HPI on 09/14/2015 by Dr. Gerri Lins Eric Schroeder is a 59 y.o. male with no significant past medical history who comes to the emergency department with complaints of abdominal distention, abdominal pain, bilateral flank pain, lower extremity edema for about 2 weeks. This was preceded by progressively worse bloating, early satiety for most of this year. He also endorses a 35 pound weight loss during this timeframe as well. He states that he adjusted his eating habits to this, did not think that there was any major health issue on until his abdominal distention and lower extremity edema. He denies chest pain, palpitations, dizziness, diaphoresis, PND, but complains of dyspnea and orthopnea (he feels his "abdomen is pushing on his lungs"). He denies fever, chills, but feels fatigued.  Interim history Found to have liver mass.  IR placed drain. GI and Onc consulted. Pending further recommendations.  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 PBC -Hepatitis A antibody +, otherwise hepatitis panel unremarkable -Fluid culture shows some yeast so Fluconazole started along with Ceftriaxone -Cytology showed no malignant cells -Oncology consulted and appreciated  -S/p PTC by IR on 12/5 (output >3L).   - patient had exchange of previous 8 [place]  biliary drain with 10 [place] drain 12/9 and brushings were performed at that time as well - await preliminary pathology findings and gradually diet  - discussed with patient that he may not have an answer oindischarge however we'll need very close follow-up with interventional, GI and oncology as an outpatient once diagnosis is known  Elevated bilirubin, AST, INR -Likely secondary to the above -Patient given FFP earlier this admit -Continue to monitor CMP  Hyponatremia 2/2 to ongoing biliary losses Has been on d5 so switched to NS IV saline 75 cc/hr  sodium is currently improved from 122-125  Urinary tract infection -UA showed 0-5 WBC, positive nitrites leukocytes, few bacteria -Patient was started on ceftriaxone 12/3 -Urine culture shows no growth to date (was not collected upon admission before antibiotics started)   leukocytosis   initial Gram stain shows yeast as well as gram-positive cocci  fluconazole started 12/7   ceftriaxone changed on 12/8 2 vancomycin and Zosyn for intra-abdominal coverage given white count 17-19 range and gram-positive cocci in chains noted     Code Status: Full  Family Communication: d/w parents at bedside who understand  Disposition Plan: Admitted. Pending further recommendations from IR and Oncology  Time Spent in minutes   25 minutes  Procedures  US guided paracentesis MRCP PCT  Consults   Gastroenterology Interventional radiology Oncology   DVT Prophylaxis  SCDs  Lab Results  Component Value Date   PLT 139* 09/20/2015    Medications  Scheduled Meds: . feeding supplement (ENSURE ENLIVE)  237 mL Oral BID BM  . fluconazole (DIFLUCAN) IV  400 mg Intravenous Q24H  . lidocaine      . lidocaine      . oxyCODONE  10 mg Oral Q12H  . piperacillin-tazobactam (ZOSYN)  IV  3.375 g Intravenous Q8H  . sodium chloride  3 mL Intravenous Q12H  . vancomycin  1,000 mg Intravenous Q12H   Continuous Infusions: . sodium chloride 75 mL/hr at  09/19/15 1419   PRN Meds:.sodium chloride, LORazepam, ondansetron **OR** ondansetron (ZOFRAN) IV, sodium chloride  Antibiotics    Anti-infectives    Start     Dose/Rate Route Frequency Ordered Stop   09/20/15 1000  piperacillin-tazobactam (ZOSYN) IVPB 3.375 g     3.375 g 12.5 mL/hr over 240 Minutes Intravenous Every 8 hours 09/20/15 0819     09/20/15 0900  vancomycin (VANCOCIN) IVPB 1000 mg/200 mL premix     1,000 mg 200 mL/hr over 60 Minutes Intravenous Every 12 hours 09/20/15 0820     09/19/15 1600  fluconazole (DIFLUCAN) IVPB 400 mg     400 mg 100 mL/hr over 120 Minutes Intravenous Every 24 hours 09/18/15 1410     09/18/15 1500  fluconazole (DIFLUCAN) IVPB 800 mg     800 mg 200 mL/hr over 120 Minutes Intravenous  Once 09/18/15 1410 09/18/15 1716   09/15/15 2000  cefTRIAXone (ROCEPHIN) 1 g in dextrose 5 % 50 mL IVPB  Status:  Discontinued     1 g 100 mL/hr over 30 Minutes Intravenous Every 24 hours 09/14/15 2230 09/20/15 0730   09/14/15 2045  cefTRIAXone (ROCEPHIN) 1 g in dextrose 5 % 50 mL IVPB     1 g 100 mL/hr over 30 Minutes Intravenous  Once 09/14/15 2031 09/14/15 2111      Subjective:   Fair Just back from IR Doing ok No pain  No f/chill/n/v   Objective:   Filed Vitals:   09/20/15 1320 09/20/15 1400 09/20/15 1430 09/20/15 1500  BP: 114/81 100/73 112/72 120/74  Pulse: 92 98 100 108  Temp:  99.6 F (37.6 C) 98.3 F (36.8 C) 98.4 F (36.9 C)  TempSrc:  Oral  Oral  Resp: $Remo'19 20 20 20  'eNOuE$ Height:      Weight:      SpO2: 100% 100% 100% 98%    Wt Readings from Last 3 Encounters:  09/20/15 80.8 kg (178 lb 2.1 oz)     Intake/Output Summary (Last 24 hours) at 09/20/15 1620 Last data filed at 09/20/15 1200  Gross per 24 hour  Intake   1465 ml  Output   2875 ml  Net  -1410 ml    Exam  General: Well developed, well nourished, NAD  HEENT: NCAT, mild Icteic Sclera, mucous membranes moist.   Cardiovascular: S1 S2 auscultated, RRR, no  murmurs  Respiratory: Clear to auscultation bilaterally with equal chest rise  Abdomen: Soft, nontender, nondistended, + bowel sounds, drain in place   Extremities: warm dry without cyanosis clubbing or edema  Neuro: AAOx3, nonfocal  Psych: Anxious, however, appropriate  Data Review   Micro Results Recent Results (from the past 240 hour(s))  Body fluid culture     Status: None   Collection Time: 09/15/15 10:34 AM  Result Value Ref Range Status   Specimen Description PERITONEAL FLUID  Final   Special Requests NONE  Final  Gram Stain   Final    FEW WBC PRESENT, PREDOMINANTLY MONONUCLEAR NO ORGANISMS SEEN Results Called to: CALLED [place] [id number] SEVERAL TIMES NO ANSWER [id number] [place]    Culture   Final    NO GROWTH 3 DAYS Performed at [facility]    Report Status 09/18/2015 FINAL  Final  Culture, Urine     Status: None   Collection Time: 09/16/15 12:34 PM  Result Value Ref Range Status   Specimen Description URINE, CLEAN CATCH  Final   Special Requests NONE  Final   Culture   Final    NO GROWTH 1 DAY Performed at [facility]    Report Status 09/17/2015 FINAL  Final  Body fluid culture     Status: None (Preliminary result)   Collection Time: 09/17/15  5:12 PM  Result Value Ref Range Status   Specimen Description BILE  Final   Special Requests Normal  Final   Gram Stain   Final    RARE WBC PRESENT, PREDOMINANTLY MONONUCLEAR NO ORGANISMS SEEN    Culture   Final    FEW CANDIDA ALBICANS CRITICAL RESULT CALLED TO, READ BACK BY AND VERIFIED WITH: R BALDWIN 09/18/15 @ 15 M VESTAL Performed at [facility]    Report Status PENDING  Incomplete  Culture, body fluid-bottle     Status: None (Preliminary result)   Collection Time: 09/19/15 11:53 AM  Result Value Ref Range Status   Specimen Description FLUID BILE  Final   Special Requests NONE  Final   Gram Stain   Final    GRAM POSITIVE RODS IN BOTH AEROBIC AND ANAEROBIC BOTTLES CRITICAL RESULT  CALLED TO, READ BACK BY AND VERIFIED WITH: MEREDITH $RemoveBefore'[email or web address]$  09/20/15 MKELLY    Culture   Final    NO GROWTH < 24 HOURS Performed at [facility]    Report Status PENDING  Incomplete  Gram stain     Status: None   Collection Time: 09/19/15 11:53 AM  Result Value Ref Range Status   Specimen Description FLUID BILE  Final   Special Requests NONE  Final   Gram Stain   Final    NO WBC SEEN FEW GRAM POSITIVE RODS RARE GRAM NEGATIVE RODS RARE BUDDING YEAST SEEN Performed at [facility]    Report Status 09/19/2015 FINAL  Final    Radiology Reports Dg Chest 2 View  09/14/2015  CLINICAL DATA:  Bilateral ankle swelling. Abdominal distention. Back pain. Shallow breathing. EXAM: CHEST  2 VIEW COMPARISON:  None. FINDINGS: Poor inspiration. Normal sized heart. Minimal left lateral pleural thickening. No pleural fluid is seen posteriorly on either side. Small amount of linear density at the left lung base. Mild thoracic spine degenerative changes. IMPRESSION: Poor inspiration with a small amount of pleural and parenchymal scarring at the left lung base. Electronically Signed   By: Claudie Revering M.D.   On: 09/14/2015 19:20   Ct Chest W Contrast  09/15/2015  CLINICAL DATA:  Liver mass diagnosed on CT 1 day prior. Status post paracentesis. Fatigue. Inpatient. EXAM: CT CHEST WITH CONTRAST TECHNIQUE: Multidetector CT imaging of the chest was performed during intravenous contrast administration. CONTRAST:  42mL OMNIPAQUE IOHEXOL 300 MG/ML  SOLN COMPARISON:  09/14/2015 CT abdomen/ pelvis. Chest radiograph from 1 day prior. FINDINGS: Mediastinum/Nodes: Normal heart size. No pericardial fluid/thickening. Left anterior descending and left circumflex coronary atherosclerosis. Great vessels are normal in course and caliber. No central pulmonary emboli. Normal visualized thyroid. Large lower esophageal varices. No pathologically  enlarged axillary, mediastinal or hilar lymph nodes. Lungs/Pleura: No  pneumothorax. Trace left pleural effusion. Subsegmental atelectasis in both lower lobes. No acute consolidative airspace disease, significant pulmonary nodules or lung masses. Upper abdomen: Re- demonstrated is cirrhosis and diffuse severe intrahepatic biliary ductal dilatation, with re- demonstration of central liver 4.7 cm mass extending superiorly and inferiorly along the central bile ducts. There are stable subcentimeter hypodense lesions in the left liver lobe and simple cysts in the right liver lobe. Small volume upper abdominal ascites, decreased. Fat stranding throughout the upper abdominal mesenteric and omental fat, unchanged. Stable dilated visualized proximal common bile duct. Stable mild upper retroperitoneal adenopathy. Musculoskeletal: Stable nondisplaced healing subacute lateral left eighth rib fracture. Mild-to-moderate degenerative changes in thoracic spine. IMPRESSION: 1. No evidence of metastatic disease in the chest. 2. Trace left pleural effusion. Mild bibasilar atelectasis. No acute consolidative airspace disease. 3. Three-vessel coronary atherosclerosis. 4. Stable nondisplaced healing subacute lateral left eighth rib fracture. 5. Re- demonstration of cirrhosis, central liver mass, severe diffuse intrahepatic biliary ductal dilatation, indeterminate subcentimeter hypodense left liver lobe lesions. Small volume upper abdominal ascites, decreased. Electronically Signed   By: Ilona Sorrel M.D.   On: 09/15/2015 13:47   Ct Abdomen Pelvis W Contrast  09/14/2015  CLINICAL DATA:  Abdominal distention. Back pain. Lower extremity swelling. EXAM: CT ABDOMEN AND PELVIS WITH CONTRAST TECHNIQUE: Multidetector CT imaging of the abdomen and pelvis was performed using the standard protocol following bolus administration of intravenous contrast. CONTRAST:  138mL OMNIPAQUE IOHEXOL 300 MG/ML  SOLN COMPARISON:  None. FINDINGS: Lower chest: No significant pulmonary nodules or acute consolidative airspace  disease. Subsegmental atelectasis in the basilar lower lobes. Hepatobiliary: The liver surface is diffusely prominently irregular, and there is relative hypertrophy of the left liver lobe, in keeping with cirrhosis. There is severe diffuse intrahepatic biliary ductal dilatation. There is a lobulated 4.8 x 3.5 cm mass in the porta hepatis (series 2/ image 25), which demonstrates hypoenhancement relative to the liver parenchyma, and which demonstrates finger-like extensions into the right liver lobe along the biliary tree. 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 Simple appearing 1.9 cm liver cyst in the posterior right liver lobe. Separate simple appearing 3.4 cm liver cyst in the posterior upper right liver lobe. There are at least 2 additional subcentimeter hypodense lesions in the left liver lobe, too small to characterize. Nondistended gallbladder with nonspecific mild diffuse gallbladder wall thickening. The common bile duct is dilated up to 14 mm in diameter proximally with smooth distal tapering. Pancreas: Normal, with no mass or duct dilation. Spleen: Normal size. No mass. Adrenals/Urinary Tract: Normal adrenals. Normal kidneys with no hydronephrosis and no renal mass. Normal bladder. Stomach/Bowel: Grossly normal stomach. Normal caliber small bowel with no small bowel wall thickening. Normal appendix. Normal large bowel with no diverticulosis, large bowel wall thickening or pericolonic fat stranding. Vascular/Lymphatic: Normal caliber abdominal aorta. Patent portal, splenic and renal veins. There are large esophageal varices. There are small paraumbilical varices. There are additional portosystemic varices in the left omentum, which appear to drain into the left pelvic deep veins. There are multiple mildly enlarged porta hepatis nodes, largest 1.4 cm (series 2/ image 30). Reproductive: Normal size prostate with nonspecific internal prostatic calcifications. Other: No  pneumoperitoneum. Moderate to large volume ascites, which measures simple fluid density. Nonspecific fat stranding throughout the greater omentum and mesentery. Musculoskeletal: No aggressive appearing focal osseous lesions. Subacute healing lateral left eighth rib fracture. Mild-to-moderate degenerative changes in the visualized thoracolumbar  spine. IMPRESSION: 1. Cirrhosis. Lobulated 4.8 x 3.5 cm solid mass in the porta hepatis with finger-like extensions into the right liver lobe along the biliary tree. Severe diffuse intrahepatic biliary ductal dilatation. 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 MRI abdomen with and without intravenous contrast could be performed after paracentesis for further evaluation. Recommend GI consultation for ERCP and possible biliary decompression, which may require IR consultation for percutaneous biliary decompression. 2. Large volume ascites. 3. Large esophageal varices. Small paraumbilical varices. Additional portosystemic left omental varices. 4. Nonspecific mild porta hepatis lymphadenopathy. 5. Separate subcentimeter hypodense liver lesions are too small to characterize and could represent liver metastases. 6. Subacute lateral left eighth rib fracture. These results were called by telephone at the time of interpretation on 09/14/2015 at 7:38 pm to Dr. Lonia Skinner , who verbally acknowledged these results. Electronically Signed   By: Ilona Sorrel M.D.   On: 09/14/2015 19:40   Mr 3d Recon At Scanner  09/15/2015  CLINICAL DATA:  Cirrhosis with ascites, distal esophageal varices and lobulated mass in the porta hepatis causing biliary obstruction on CT. EXAM: MRI ABDOMEN WITHOUT AND WITH CONTRAST (INCLUDING MRCP) TECHNIQUE: Multiplanar multisequence MR imaging of the abdomen was performed both before and after the administration of intravenous contrast. Heavily T2-weighted  images of the biliary and pancreatic ducts were obtained, and three-dimensional MRCP images were rendered by post processing. CONTRAST:  70mL MULTIHANCE GADOBENATE DIMEGLUMINE 529 MG/ML IV SOLN COMPARISON:  Abdominal CT 09/14/2015. FINDINGS: Unfortunately, the study is mildly motion degraded due to the patient's inability to completely suspend respiration. Lower chest:  Small left-greater-than-right pleural effusions. Hepatobiliary: The liver contours are diffusely irregular consistent with cirrhosis. There are multiple siderotic nodules throughout the liver with diffuse suppression of hepatic signal on the in phase images consistent with hemosiderosis. There is a 1.6 cm lesion in the medial segment of the left hepatic lobe which demonstrates T1 shortening and low level enhancement following contrast. No other enhancing intrahepatic lesions are identified. There are several hepatic cysts, largest projecting from the dome of the right hepatic lobe, measuring 3.5 cm. As demonstrated on CT, there is a tubular mass within the porta hepatis which appears intra biliary. This demonstrates intermediate T2 signal, low-level enhancement and measures up to 3.8 cm transverse and 6.3 cm in length. There is resulting marked intrahepatic biliary dilatation with the left hepatic duct measuring up to 1.7 cm. The distal common bile duct is normal in caliber. Pancreas: Mildly atrophied. No evidence of pancreatic mass or pancreatic ductal dilatation. Spleen: Normal in size without focal abnormality. Adrenals/Urinary Tract: Both adrenal glands appear normal. Small renal cysts are noted. There is no suspicious renal finding or hydronephrosis. Stomach/Bowel: No evidence of bowel wall thickening, distention or surrounding inflammatory change. Vascular/Lymphatic: Multiple mildly enlarged lymph nodes within the porta hepatis are again noted. There are distal esophageal varices with additional portosystemic varices in the retroperitoneum and  omentum. Other: Ascites has decreased in volume. No suspicious peritoneal enhancement. Musculoskeletal: No acute or significant osseous findings. IMPRESSION: 1. 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 Tissue sampling and biliary decompression recommended. 2. Prominent nonspecific lymph nodes in the porta hepatis, likely related to chronic liver disease. No definite distant metastases. 3. Macronodular cirrhosis with multiple siderotic nodules. There is one T1 hyperintense nodule in the left lobe which demonstrates low level enhancement and could reflect a  dysplastic nodule. 4. Decreased volume of ascites following paracentesis. Electronically Signed   By: Richardean Sale M.D.   On: 09/15/2015 13:52   US Paracentesis  09/15/2015  Ardis Rowan, PA-C     09/15/2015 11:37 AM Successful US guided paracentesis from LLQ. Yielded 6 liters of clear yellow fluid. No immediate complications. Pt tolerated well. Specimen was sent for labs. WENDY S BLAIR PA-C 09/15/2015 11:37 AM   Ir Fluoro Guide Ndl Plmt / Bx  09/20/2015  CLINICAL DATA:  58 year old male with a history of hyperbilirubinemia and biliary obstruction. 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 The patient has undergone a left-sided percutaneous transhepatic cholangiogram and drainage performed 09/16/2015. He has not had normalization of his hyperbilirubinemia. He presents today for a through the tube cholangiogram and a biopsy EXAM: FLUOROSCOPIC GUIDED FINE NEEDLE ASPIRATION OF LIVER TUMOR/BILIARY TUMOR THROUGH THE TUBE CHOLANGIOGRAM EXCHANGE OF INDWELLING LEFT-SIDED BILIARY INTERNAL EXTERNAL DRAIN FLUOROSCOPY TIME:  7 minutes 30 seconds MEDICATIONS AND MEDICAL HISTORY: None ANESTHESIA/SEDATION: 1.5 mg Versed, 75 mcg fentanyl 55 minutes CONTRAST:  37mL OMNIPAQUE IOHEXOL 300 MG/ML  SOLN COMPLICATIONS: None  PROCEDURE: Informed consent was obtained from the patient following explanation of the procedure, risks, benefits and alternatives. The patient understands, agrees and consents for the procedure. All questions were addressed. A time out was performed. Maximal barrier sterile technique utilized including caps, mask, sterile gowns, sterile gloves, large sterile drape, hand hygiene, and 1% lidocaine was used for local anesthesia. The patient is positioned supine position on the IR table and the upper abdomen was prepped and draped in the usual sterile fashion. Scout images of the indwelling biliary tube were acquired and were used as a target for fine-needle aspiration of the soft tissues in the adjacent liver and biliary system. Three separate 21 gauge fine-needle Chiba were used for fine-needle aspirations under fluoroscopic guidance. The samples were passed to a cytotechnologist in the room. Initial quick stain was negative, and 3 separate fine-needle aspirations were acquired under fluoroscopy. A Bentson wire was advanced through the indwelling tube into the duodenum, and the tube was removed. An 8 French sheath was advanced over the wire into the biliary system. The dilator was removed and a through the tube cholangiogram was performed with multiple obliquities. A new 10 French internal-external biliary tube was then placed. Final image was stored. Patient tolerated the procedure well and remained hemodynamically stable throughout. No complications were encountered and no significant blood loss was encountered. FINDINGS: Through the tube cholangiogram demonstrates opacification of the majority of the right-sided ductal system, which is in communication with the left-sided ductal system. There is distortion of the ducts at the confluence of the left and the right sided ducts, with a few ducts of segment 6 not opacified, potentially obstructed by the tumor. Significant common bile duct and common hepatic duct  dilation, with a tubular filling defect nearly entirely filling the common hepatic duct and common bile duct to the level of the ampulla. Final image demonstrates placement of a new 60 [place] internal external biliary tube terminating in the duodenum, with the most proximal side hole within the left-sided ductal system. 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 Tissue specimen sent to pathology for complete histopathologic analysis. Status post through the tube cholangiogram with confirmation of communication of the majority of the right-sided hepatic ducts with the left-sided ducts which remain drained. Status post exchange  of internal/ external biliary drain with placement of a new 10 French drain terminating in the duodenum. Signed, Dulcy Fanny. Earleen Newport, DO [facility] Electronically Signed   By: Corrie Mckusick D.O.   On: 09/20/2015 14:21   Mr Abd W/wo Cm/mrcp  09/15/2015  CLINICAL DATA:  Cirrhosis with ascites, distal esophageal varices and lobulated mass in the porta hepatis causing biliary obstruction on CT. EXAM: MRI ABDOMEN WITHOUT AND WITH CONTRAST (INCLUDING MRCP) TECHNIQUE: Multiplanar multisequence MR imaging of the abdomen was performed both before and after the administration of intravenous contrast. Heavily T2-weighted images of the biliary and pancreatic ducts were obtained, and three-dimensional MRCP images were rendered by post processing. CONTRAST:  71mL MULTIHANCE GADOBENATE DIMEGLUMINE 529 MG/ML IV SOLN COMPARISON:  Abdominal CT 09/14/2015. FINDINGS: Unfortunately, the study is mildly motion degraded due to the patient's inability to completely suspend respiration. Lower chest:  Small left-greater-than-right pleural effusions. Hepatobiliary: The liver contours are diffusely irregular consistent with cirrhosis. There are multiple siderotic nodules throughout the  liver with diffuse suppression of hepatic signal on the in phase images consistent with hemosiderosis. There is a 1.6 cm lesion in the medial segment of the left hepatic lobe which demonstrates T1 shortening and low level enhancement following contrast. No other enhancing intrahepatic lesions are identified. There are several hepatic cysts, largest projecting from the dome of the right hepatic lobe, measuring 3.5 cm. As demonstrated on CT, there is a tubular mass within the porta hepatis which appears intra biliary. This demonstrates intermediate T2 signal, low-level enhancement and measures up to 3.8 cm transverse and 6.3 cm in length. There is resulting marked intrahepatic biliary dilatation with the left hepatic duct measuring up to 1.7 cm. The distal common bile duct is normal in caliber. Pancreas: Mildly atrophied. No evidence of pancreatic mass or pancreatic ductal dilatation. Spleen: Normal in size without focal abnormality. Adrenals/Urinary Tract: Both adrenal glands appear normal. Small renal cysts are noted. There is no suspicious renal finding or hydronephrosis. Stomach/Bowel: No evidence of bowel wall thickening, distention or surrounding inflammatory change. Vascular/Lymphatic: Multiple mildly enlarged lymph nodes within the porta hepatis are again noted. 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 Other: Ascites has decreased in volume. No suspicious peritoneal enhancement. Musculoskeletal: No acute or significant osseous findings. IMPRESSION: 1. 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 Tissue sampling and biliary decompression recommended. 2. Prominent nonspecific lymph nodes in the porta hepatis, likely related to chronic liver disease. No definite distant metastases. 3. Macronodular cirrhosis with multiple siderotic nodules. There is  one T1 hyperintense nodule in the left lobe which demonstrates low level enhancement and could reflect a dysplastic nodule. 4. Decreased volume of ascites following paracentesis. Electronically Signed   By: Richardean Sale M.D.   On: 09/15/2015 13:52   Ir Int Lianne Cure Biliary Drain With Cholangiogram  09/17/2015  CLINICAL DATA:  Biliary obstruction EXAM: IR INT-EXT BILIARY DRAIN W/ CHOLANGIOGRAM; IR BALLOON DILATION OF BILIARY DUCT/AMPULLA FLUOROSCOPY TIME:  9 minutes and 6 seconds MEDICATIONS AND MEDICAL HISTORY: Versed 4 mg, Fentanyl 100 mcg. Additional Medications: None.  Patient was on Rocephin. ANESTHESIA/SEDATION: Moderate sedation time: 60 minutes CONTRAST:  15 cc Omnipaque 300 PROCEDURE: The procedure, risks, benefits, and alternatives were explained to the patient. Questions regarding the procedure were encouraged and answered. The patient understands and consents to the procedure. The upper abdominal region was prepped with Betadine  in a sterile fashion, and a sterile drape was applied covering the operative field. A sterile gown and sterile gloves were used for the procedure. Under sonographic guidance, a Chiba needle was inserted into a left hepatic duct. Contrast was injected opacifying the biliary tree. The needle was removed over a 018 wire. The Accustick transitional dilator was advanced over the wire to the mid biliary tree. It could not be advanced into the central biliary tree secondary to a very resistant partial obstruction. An Amplatz wire was advanced through the transitional dilator. This was advanced into the central biliary tree. Again, the 6 [place] transitional dilator of the Accustick set could not be advanced across the Amplatz wire. A 4 French glide catheter was advanced across the Amplatz wire into the central biliary tree. The glide catheter was advanced over a Bentson wire into the duodenum then was again exchange for the Amplatz wire. Initial attempts at dilating across the partial  obstruction with serial dilators starting from 5 [place] were unsuccessful. A 5 French sheath was advanced over the Amplatz into the peripheral biliary tree a 4 French balloon was utilized to dilate the partial obstruction. The tract was then easily dilated to 8 [place] an 8 [place] biliary drain was then advanced over the Amplatz into the duodenum. Two additional sideholes were cut above the upper marker. It was looped and string fixed in the duodenum then sewn to the skin. The initial aspirate was sent for a cytology analysis. Contrast was injected. FINDINGS: Percutaneous cholangiography into the left the biliary tree demonstrates very limited opacification the the bile ducts. This was performed in an attempt to prevent sepsis. Final images demonstrate left internal external biliary drain placement with its tip coiled in the duodenum. Central left-sided ducts only were opacified by contrast. COMPLICATIONS: None IMPRESSION: Successful left internal external biliary drain placement. An 8 French device was placed secondary to a resistant biliary obstruction. Initial bile aspirate was sent for cytology. Electronically Signed   By: Marybelle Killings M.D.   On: 09/17/2015 08:33   Ir Exchange Biliary Drain  09/20/2015  CLINICAL DATA:  59 year old male with a history of hyperbilirubinemia and biliary obstruction. 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 The patient has undergone a left-sided percutaneous transhepatic cholangiogram and drainage performed 09/16/2015. He has not had normalization of his hyperbilirubinemia. He presents today for a through the tube cholangiogram and a biopsy EXAM: FLUOROSCOPIC GUIDED FINE NEEDLE ASPIRATION OF LIVER TUMOR/BILIARY TUMOR THROUGH THE TUBE CHOLANGIOGRAM EXCHANGE OF INDWELLING LEFT-SIDED BILIARY INTERNAL EXTERNAL DRAIN FLUOROSCOPY TIME:  7 minutes 30 seconds MEDICATIONS AND MEDICAL HISTORY: None ANESTHESIA/SEDATION: 1.5 mg Versed, 75 mcg  fentanyl 55 minutes CONTRAST:  32mL OMNIPAQUE IOHEXOL 300 MG/ML  SOLN COMPLICATIONS: None PROCEDURE: Informed consent was obtained from the patient following explanation of the procedure, risks, benefits and alternatives. The patient understands, agrees and consents for the procedure. All questions were addressed. A time out was performed. Maximal barrier sterile technique utilized including caps, mask, sterile gowns, sterile gloves, large sterile drape, hand hygiene, and 1% lidocaine was used for local anesthesia. The patient is positioned supine position on the IR table and the upper abdomen was prepped and draped in the usual sterile fashion. Scout images of the indwelling biliary tube were acquired and were used as a target for fine-needle aspiration of the soft tissues in the adjacent liver and biliary system. Three separate 21 gauge fine-needle Chiba were used for fine-needle aspirations under fluoroscopic  guidance. The samples were passed to a cytotechnologist in the room. Initial quick stain was negative, and 3 separate fine-needle aspirations were acquired under fluoroscopy. A Bentson wire was advanced through the indwelling tube into the duodenum, and the tube was removed. An 8 French sheath was advanced over the wire into the biliary system. The dilator was removed and a through the tube cholangiogram was performed with multiple obliquities. A new 10 French internal-external biliary tube was then placed. Final image was stored. Patient tolerated the procedure well and remained hemodynamically stable throughout. No complications were encountered and no significant blood loss was encountered. FINDINGS: Through the tube cholangiogram demonstrates opacification of the majority of the right-sided ductal system, which is in communication with the left-sided ductal system. There is distortion of the ducts at the confluence of the left and the right sided ducts, with a few ducts of segment 6 not opacified,  potentially obstructed by the tumor. Significant common bile duct and common hepatic duct dilation, with a tubular filling defect nearly entirely filling the common hepatic duct and common bile duct to the level of the ampulla. Final image demonstrates placement of a new 74 [place] internal external biliary tube terminating in the duodenum, with the most proximal side hole within the left-sided ductal system. 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 Tissue specimen sent to pathology for complete histopathologic analysis. Status post through the tube cholangiogram with confirmation of communication of the majority of the right-sided hepatic ducts with the left-sided ducts which remain drained. Status post exchange of internal/ external biliary drain with placement of a new 10 French drain terminating in the duodenum. Signed, Dulcy Fanny. Earleen Newport, DO [facility] Electronically Signed   By: Corrie Mckusick D.O.   On: 09/20/2015 14:21   Ir Balloon Dilation Of Biliary Ducts/ampulla  09/17/2015  CLINICAL DATA:  Biliary obstruction EXAM: IR INT-EXT BILIARY DRAIN W/ CHOLANGIOGRAM; IR BALLOON DILATION OF BILIARY DUCT/AMPULLA FLUOROSCOPY TIME:  9 minutes and 6 seconds MEDICATIONS AND MEDICAL HISTORY: Versed 4 mg, Fentanyl 100 mcg. Additional Medications: None.  Patient was on Rocephin. ANESTHESIA/SEDATION: Moderate sedation time: 60 minutes CONTRAST:  15 cc Omnipaque 300 PROCEDURE: The procedure, risks, benefits, and alternatives were explained to the patient. Questions regarding the procedure were encouraged and answered. The patient understands and consents to the procedure. The upper abdominal region was prepped with Betadine in a sterile fashion, and a sterile drape was applied covering the operative field. A sterile gown and sterile gloves were used for the procedure. Under sonographic  guidance, a Chiba needle was inserted into a left hepatic duct. Contrast was injected opacifying the biliary tree. The needle was removed over a 018 wire. The Accustick transitional dilator was advanced over the wire to the mid biliary tree. It could not be advanced into the central biliary tree secondary to a very resistant partial obstruction. An Amplatz wire was advanced through the transitional dilator. This was advanced into the central biliary tree. Again, the 6 [place] transitional dilator of the Accustick set could not be advanced across the Amplatz wire. A 4 French glide catheter was advanced across the Amplatz wire into the central biliary tree. The glide catheter was advanced over a Bentson wire into the duodenum then was again exchange for the Amplatz wire. Initial attempts at dilating across the partial obstruction with serial dilators starting from 5 [place] were unsuccessful. A 5 French sheath was advanced over  the Amplatz into the peripheral biliary tree a 4 French balloon was utilized to dilate the partial obstruction. The tract was then easily dilated to 8 [place] an 8 [place] biliary drain was then advanced over the Amplatz into the duodenum. Two additional sideholes were cut above the upper marker. It was looped and string fixed in the duodenum then sewn to the skin. The initial aspirate was sent for a cytology analysis. Contrast was injected. FINDINGS: Percutaneous cholangiography into the left the biliary tree demonstrates very limited opacification the the bile ducts. This was performed in an attempt to prevent sepsis. Final images demonstrate left internal external biliary drain placement with its tip coiled in the duodenum. Central left-sided ducts only were opacified by contrast. COMPLICATIONS: None IMPRESSION: Successful left internal external biliary drain placement. An 8 French device was placed secondary to a resistant biliary obstruction. Initial bile aspirate was sent for cytology.  Electronically Signed   By: Marybelle Killings M.D.   On: 09/17/2015 08:33    CBC  Recent Labs Lab 09/16/15 0430 09/17/15 0418 09/18/15 0400 09/19/15 0355 09/20/15 0530  WBC 5.5 7.0 13.4* 19.6* 17.8*  HGB 12.8* 13.4 14.8 14.3 14.1  HCT 36.5* 37.6* 40.9 40.4 39.7  PLT 106* 119* 133* 125* 139*  MCV 102.5* 103.3* 102.8* 103.3* 102.6*  MCH 36.0* 36.8* 37.2* 36.6* 36.4*  MCHC 35.1 35.6 36.2* 35.4 35.5  RDW 14.3 14.2 14.1 14.1 13.9    Chemistries   Recent Labs Lab 09/16/15 0430 09/17/15 0418 09/18/15 0400 09/19/15 0355 09/20/15 0530  NA 133* 132* 132* 128* 125*  K 4.2 4.1 4.1 4.0 4.4  CL 101 99* 92* 89* 88*  CO2 27 28 32 33* 30  GLUCOSE 108* 105* 107* 116* 82  BUN $Re'12 16 19 19 'DBe$ 31*  CREATININE 0.62 0.68 0.80 0.75 1.13  CALCIUM 8.2* 9.0 9.4 9.0 8.8*  MG  --   --  1.9  --   --   AST 109* 91* 72* 51* 53*  ALT 50 46 43 34 31  ALKPHOS 177* 170* 171* 148* 129*  BILITOT 3.6* 3.6* 2.8* 3.0* 3.8*   ------------------------------------------------------------------------------------------------------------------ estimated creatinine clearance is 72.5 mL/min (by C-G formula based on Cr of 1.13). ------------------------------------------------------------------------------------------------------------------ No results for input(s): HGBA1C in the last 72 hours. ------------------------------------------------------------------------------------------------------------------ No results for input(s): CHOL, HDL, LDLCALC, TRIG, CHOLHDL, LDLDIRECT in the last 72 hours. ------------------------------------------------------------------------------------------------------------------ No results for input(s): TSH, T4TOTAL, T3FREE, THYROIDAB in the last 72 hours.  Invalid input(s): FREET3 ------------------------------------------------------------------------------------------------------------------ No results for input(s): VITAMINB12, FOLATE, FERRITIN, TIBC, IRON, RETICCTPCT in the last 72  hours.  Coagulation profile  Recent Labs Lab 09/15/15 1208 09/18/15 0400 09/19/15 0355 09/20/15 0530  INR 1.57* 1.48 1.68* 1.64*    No results for input(s): DDIMER in the last 72 hours.  Cardiac Enzymes No results for input(s): CKMB, TROPONINI, MYOGLOBIN in the last 168 hours.  Invalid input(s): CK ------------------------------------------------------------------------------------------------------------------ Invalid input(s): POCBNP    Ileigh Mettler, Espy D.O. on 09/20/2015 at 4:20 PM  Between 7am to 7pm - Pager - [phone]  After 7pm go to [email or web address] - password [email or web address]  And look for the night coverage person covering for me after hours  [facility] Office  [phone]

## 2015-09-21 NOTE — Progress Notes (Signed)
Subjective: Pt sitting up in bed, no new c/o S/p FNA/brush biopsy of biliary/liver mass yesterday with exchange and upsize of perc left biliary drain. New (R)sided drain was not necessary as right ductal system communicating with left system.   Allergies: Review of patient's allergies indicates no known allergies.  Medications:  Current facility-administered medications:  .  0.9 %  sodium chloride infusion, 250 mL, Intravenous, PRN, Maryann Mikhail, DO .  0.9 %  sodium chloride infusion, , Intravenous, Continuous, Nita Sells, MD, Last Rate: 75 mL/hr at 09/20/15 2100 .  feeding supplement (ENSURE ENLIVE) (ENSURE ENLIVE) liquid 237 mL, 237 mL, Oral, BID BM, Clayton Bibles, RD, 237 mL at 09/20/15 1027 .  [COMPLETED] fluconazole (DIFLUCAN) IVPB 800 mg, 800 mg, Intravenous, Once, 800 mg at 09/18/15 1516 **FOLLOWED BY** fluconazole (DIFLUCAN) IVPB 400 mg, 400 mg, Intravenous, Q24H, Donald Prose Runyon, RPH, 400 mg at 09/20/15 1533 .  LORazepam (ATIVAN) injection 1 mg, 1 mg, Intravenous, QHS PRN,MR X 1, Reubin Milan, MD .  ondansetron [facility]) tablet 4 mg, 4 mg, Oral, Q6H PRN **OR** ondansetron (ZOFRAN) injection 4 mg, 4 mg, Intravenous, Q6H PRN, Reubin Milan, MD .  oxyCODONE (OXYCONTIN) 12 hr tablet 10 mg, 10 mg, Oral, Q12H, Amy S Esterwood, PA-C, 10 mg at 09/20/15 2211 .  piperacillin-tazobactam (ZOSYN) IVPB 3.375 g, 3.375 g, Intravenous, Q8H, Donald Prose Runyon, RPH, 3.375 g at 09/21/15 0109 .  sodium chloride 0.9 % injection 3 mL, 3 mL, Intravenous, Q12H, Maryann Mikhail, DO, 3 mL at 09/18/15 1054 .  sodium chloride 0.9 % injection 3 mL, 3 mL, Intravenous, PRN, Maryann Mikhail, DO .  vancomycin (VANCOCIN) IVPB 1000 mg/200 mL premix, 1,000 mg, Intravenous, Q12H, Dara Hoyer, RPH, 1,000 mg at 09/20/15 2052    Vital Signs: BP 113/74 mmHg  Pulse 88  Temp(Src) 98.2 F (36.8 C) (Oral)  Resp 19  Ht 5' 9.5" (1.765 m)  Wt 175 lb 14.8 oz (79.8 kg)  BMI 25.62 kg/m2  SpO2  98%  Physical Exam Perc bili drain intact, site clean, thin golden bilious output.  Imaging: Ir Fluoro Guide Ndl Plmt / Bx  09/20/2015  CLINICAL DATA:  59 year old male with a history of hyperbilirubinemia and biliary obstruction. 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 The patient has undergone a left-sided percutaneous transhepatic cholangiogram and drainage performed 09/16/2015. He has not had normalization of his hyperbilirubinemia. He presents today for a through the tube cholangiogram and a biopsy EXAM: FLUOROSCOPIC GUIDED FINE NEEDLE ASPIRATION OF LIVER TUMOR/BILIARY TUMOR THROUGH THE TUBE CHOLANGIOGRAM EXCHANGE OF INDWELLING LEFT-SIDED BILIARY INTERNAL EXTERNAL DRAIN FLUOROSCOPY TIME:  7 minutes 30 seconds MEDICATIONS AND MEDICAL HISTORY: None ANESTHESIA/SEDATION: 1.5 mg Versed, 75 mcg fentanyl 55 minutes CONTRAST:  12mL OMNIPAQUE IOHEXOL 300 MG/ML  SOLN COMPLICATIONS: None PROCEDURE: Informed consent was obtained from the patient following explanation of the procedure, risks, benefits and alternatives. The patient understands, agrees and consents for the procedure. All questions were addressed. A time out was performed. Maximal barrier sterile technique utilized including caps, mask, sterile gowns, sterile gloves, large sterile drape, hand hygiene, and 1% lidocaine was used for local anesthesia. The patient is positioned supine position on the IR table and the upper abdomen was prepped and draped in the usual sterile fashion. Scout images of the indwelling biliary tube were acquired and were used as a target for fine-needle aspiration of the soft tissues in the adjacent liver and biliary system. Three separate  27 gauge fine-needle Chiba were used for fine-needle aspirations under fluoroscopic guidance. The samples were passed to a cytotechnologist in the room. Initial quick stain was negative, and 3 separate fine-needle aspirations were acquired under  fluoroscopy. A Bentson wire was advanced through the indwelling tube into the duodenum, and the tube was removed. An 8 French sheath was advanced over the wire into the biliary system. The dilator was removed and a through the tube cholangiogram was performed with multiple obliquities. A new 10 French internal-external biliary tube was then placed. Final image was stored. Patient tolerated the procedure well and remained hemodynamically stable throughout. No complications were encountered and no significant blood loss was encountered. FINDINGS: Through the tube cholangiogram demonstrates opacification of the majority of the right-sided ductal system, which is in communication with the left-sided ductal system. There is distortion of the ducts at the confluence of the left and the right sided ducts, with a few ducts of segment 6 not opacified, potentially obstructed by the tumor. Significant common bile duct and common hepatic duct dilation, with a tubular filling defect nearly entirely filling the common hepatic duct and common bile duct to the level of the ampulla. Final image demonstrates placement of a new 67 [place] internal external biliary tube terminating in the duodenum, with the most proximal side hole within the left-sided ductal system. 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 Tissue specimen sent to pathology for complete histopathologic analysis. Status post through the tube cholangiogram with confirmation of communication of the majority of the right-sided hepatic ducts with the left-sided ducts which remain drained. Status post exchange of internal/ external biliary drain with placement of a new 10 French drain terminating in the duodenum. Signed, Dulcy Fanny. Earleen Newport, DO [facility] Radiology Electronically Signed   By: Corrie Mckusick D.O.   On: 09/20/2015 14:21   Ir Exchange  Biliary Drain  09/20/2015  CLINICAL DATA:  59 year old male with a history of hyperbilirubinemia and biliary obstruction. 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 The patient has undergone a left-sided percutaneous transhepatic cholangiogram and drainage performed 09/16/2015. He has not had normalization of his hyperbilirubinemia. He presents today for a through the tube cholangiogram and a biopsy EXAM: FLUOROSCOPIC GUIDED FINE NEEDLE ASPIRATION OF LIVER TUMOR/BILIARY TUMOR THROUGH THE TUBE CHOLANGIOGRAM EXCHANGE OF INDWELLING LEFT-SIDED BILIARY INTERNAL EXTERNAL DRAIN FLUOROSCOPY TIME:  7 minutes 30 seconds MEDICATIONS AND MEDICAL HISTORY: None ANESTHESIA/SEDATION: 1.5 mg Versed, 75 mcg fentanyl 55 minutes CONTRAST:  74mL OMNIPAQUE IOHEXOL 300 MG/ML  SOLN COMPLICATIONS: None PROCEDURE: Informed consent was obtained from the patient following explanation of the procedure, risks, benefits and alternatives. The patient understands, agrees and consents for the procedure. All questions were addressed. A time out was performed. Maximal barrier sterile technique utilized including caps, mask, sterile gowns, sterile gloves, large sterile drape, hand hygiene, and 1% lidocaine was used for local anesthesia. The patient is positioned supine position on the IR table and the upper abdomen was prepped and draped in the usual sterile fashion. Scout images of the indwelling biliary tube were acquired and were used as a target for fine-needle aspiration of the soft tissues in the adjacent liver and biliary system. Three separate 21 gauge fine-needle Chiba were used for fine-needle aspirations under fluoroscopic guidance. The samples were passed to a cytotechnologist in the room. Initial quick stain was negative, and 3 separate fine-needle aspirations  were acquired under fluoroscopy. A Bentson wire was advanced through the indwelling tube into the duodenum, and the tube was removed. An 8  French sheath was advanced over the wire into the biliary system. The dilator was removed and a through the tube cholangiogram was performed with multiple obliquities. A new 10 French internal-external biliary tube was then placed. Final image was stored. Patient tolerated the procedure well and remained hemodynamically stable throughout. No complications were encountered and no significant blood loss was encountered. FINDINGS: Through the tube cholangiogram demonstrates opacification of the majority of the right-sided ductal system, which is in communication with the left-sided ductal system. There is distortion of the ducts at the confluence of the left and the right sided ducts, with a few ducts of segment 6 not opacified, potentially obstructed by the tumor. Significant common bile duct and common hepatic duct dilation, with a tubular filling defect nearly entirely filling the common hepatic duct and common bile duct to the level of the ampulla. Final image demonstrates placement of a new 44 [place] internal external biliary tube terminating in the duodenum, with the most proximal side hole within the left-sided ductal system. 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 Tissue specimen sent to pathology for complete histopathologic analysis. Status post through the tube cholangiogram with confirmation of communication of the majority of the right-sided hepatic ducts with the left-sided ducts which remain drained. Status post exchange of internal/ external biliary drain with placement of a new 10 French drain terminating in the duodenum. Signed, Dulcy Fanny. Earleen Newport, DO [facility] Radiology Electronically Signed   By: Corrie Mckusick D.O.   On: 09/20/2015 14:21    Labs:  CBC:  Recent Labs  09/17/15 0418 09/18/15 0400 09/19/15 0355 09/20/15 0530  WBC 7.0 13.4* 19.6* 17.8*  HGB 13.4  14.8 14.3 14.1  HCT 37.6* 40.9 40.4 39.7  PLT 119* 133* 125* 139*    COAGS:  Recent Labs  09/15/15 1208 09/18/15 0400 09/19/15 0355 09/20/15 0530  INR 1.57* 1.48 1.68* 1.64*  APTT 38*  --   --   --     BMP:  Recent Labs  09/17/15 0418 09/18/15 0400 09/19/15 0355 09/20/15 0530  NA 132* 132* 128* 125*  K 4.1 4.1 4.0 4.4  CL 99* 92* 89* 88*  CO2 28 32 33* 30  GLUCOSE 105* 107* 116* 82  BUN 16 19 19  31*  CALCIUM 9.0 9.4 9.0 8.8*  CREATININE 0.68 0.80 0.75 1.13  GFRNONAA >60 >60 >60 >60  GFRAA >60 >60 >60 >60    LIVER FUNCTION TESTS:  Recent Labs  09/17/15 0418 09/18/15 0400 09/19/15 0355 09/20/15 0530  BILITOT 3.6* 2.8* 3.0* 3.8*  AST 91* 72* 51* 53*  ALT 46 43 34 31  ALKPHOS 170* 171* 148* 129*  PROT 6.6 7.5 7.0 6.7  ALBUMIN 2.1* 2.2* 2.1* 2.0*    Assessment and Plan: Cirrhosis with ascites s/p paracentesis cytology no malignant cells  Biliary obstruction s/p internal/external perc biliary drain placed 12/5, FNA/brush biopsy 12/9 with exchange and upsize of I/E drain Monitor output, await cyto/path  Signed: Ascencion Dike 09/21/2015, 7:56 AM   I spent a total of 15 Minutes at the the patient's bedside AND on the patient's hospital floor or unit, greater than 50% of which was counseling/coordinating care for biliary obstruction

## 2015-09-21 NOTE — Progress Notes (Signed)
 [facility]                                                                              HPI on 09/14/2015 by Dr. David Manuel Ortiz Eric Schroeder is a 59 y.o. male with no significant past medical history who comes to the emergency department with complaints of abdominal distention, abdominal pain, bilateral flank pain, lower extremity edema for about 2 weeks. This was preceded by progressively worse bloating, early satiety for most of this year. He also endorses a 35 pound weight loss during this timeframe as well. He states that he adjusted his eating habits to this, did not think that there was any major health issue on until his abdominal distention and lower extremity edema. He denies chest pain, palpitations, dizziness, diaphoresis, PND, but complains of dyspnea and orthopnea (he feels his "abdomen is pushing on his lungs"). He denies fever, chills, but feels fatigued.  Interim history Found to have liver mass.  IR placed drain. GI and Onc consulted. Pending further recommendations.  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 PBC -Hepatitis A antibody +, otherwise hepatitis panel unremarkable -Fluid culture shows some yeast so Fluconazole started along with Ceftriaxone  -Cytology showed no malignant cells -Oncology consulted and appreciated  -S/p PTC by IR on 12/5 (output >3L).   -still high drain output ~ 1.6 liters -  patient had exchange of previous 8 French biliary drain with 10 French drain 12/9 and brushings were performed at that time as well - await preliminary pathology findings and gradually diet  - discussed with patient -needs very close follow-up with interventional, GI and oncology as an outpatient once diagnosis is known  Elevated bilirubin, AST, INR -Likely secondary to the above -Patient given FFP earlier this admit -Continue to monitor CMP  Hyponatremia 2/2 to ongoing biliary losses Has been on d5 so switched to NS IV saline 75 cc/hr  sodium is currently improved from 122-125  Urinary tract infection -UA showed 0-5 WBC, positive nitrites leukocytes, few bacteria -Patient was started on ceftriaxone 12/3 -Urine culture shows no growth to date (was not collected upon admission before antibiotics started)   leukocytosis   initial Gram stain 12/10 shows yeast as well as gram-positive cocci Await speciation as well as sensitivities  fluconazole started 12/7   ceftriaxone changed on 12/8 2 vancomycin and Zosyn for intra-abdominal coverage given white count 17-19 range and gram-positive cocci in chains noted Rpt labs in am to monitor for resolution  Code Status: Full  Family Communication: d/w Gf at bedside 12/10  Disposition Plan: Admitted. Pending further recommendations from IR and Oncology  Time Spent in minutes   25 minutes  Procedures  US guided paracentesis MRCP PCT  Consults     Gastroenterology Interventional radiology Oncology   DVT Prophylaxis  SCDs  Lab Results  Component Value Date   PLT 139* 09/20/2015    Medications  Scheduled Meds: . feeding supplement (ENSURE ENLIVE)  237 mL Oral BID BM  . fluconazole (DIFLUCAN) IV  400 mg Intravenous Q24H  . oxyCODONE  10 mg Oral Q12H  . piperacillin-tazobactam (ZOSYN)  IV  3.375 g Intravenous Q8H  . sodium chloride  3 mL Intravenous Q12H  . vancomycin  1,000 mg Intravenous Q12H   Continuous Infusions: . sodium  chloride 75 mL/hr at 09/20/15 2100   PRN Meds:.sodium chloride, LORazepam, ondansetron **OR** ondansetron (ZOFRAN) IV, sodium chloride  Antibiotics    Anti-infectives    Start     Dose/Rate Route Frequency Ordered Stop   09/20/15 1000  piperacillin-tazobactam (ZOSYN) IVPB 3.375 g     3.375 g 12.5 mL/hr over 240 Minutes Intravenous Every 8 hours 09/20/15 0819     09/20/15 0900  vancomycin (VANCOCIN) IVPB 1000 mg/200 mL premix     1,000 mg 200 mL/hr over 60 Minutes Intravenous Every 12 hours 09/20/15 0820     09/19/15 1600  fluconazole (DIFLUCAN) IVPB 400 mg     400 mg 100 mL/hr over 120 Minutes Intravenous Every 24 hours 09/18/15 1410     09/18/15 1500  fluconazole (DIFLUCAN) IVPB 800 mg     800 mg 200 mL/hr over 120 Minutes Intravenous  Once 09/18/15 1410 09/18/15 1716   09/15/15 2000  cefTRIAXone (ROCEPHIN) 1 g in dextrose 5 % 50 mL IVPB  Status:  Discontinued     1 g 100 mL/hr over 30 Minutes Intravenous Every 24 hours 09/14/15 2230 09/20/15 0730   09/14/15 2045  cefTRIAXone (ROCEPHIN) 1 g in dextrose 5 % 50 mL IVPB     1 g 100 mL/hr over 30 Minutes Intravenous  Once 09/14/15 2031 09/14/15 2111      Subjective:   Fair Just back from IR Doing ok No pain  No f/chill/n/v   Objective:   Filed Vitals:   09/20/15 1500 09/20/15 2122 09/21/15 0550 09/21/15 1325  BP: 120/74 109/63 113/74 114/67  Pulse: 108 94 88 91  Temp: 98.4 F (36.9 C) 98.8 F (37.1 C) 98.2 F (36.8 C) 98.5 F (36.9 C)  TempSrc: Oral Oral Oral Oral  Resp: 20 20 19 16  Height:      Weight:   79.8 kg (175 lb 14.8 oz)   SpO2: 98% 98% 98% 100%    Wt Readings from Last 3 Encounters:  09/21/15 79.8 kg (175 lb 14.8 oz)     Intake/Output Summary (Last 24 hours) at 09/21/15 1423 Last data filed at 09/21/15 1200  Gross per 24 hour  Intake 3121.25 ml  Output   2785 ml  Net 336.25 ml    Exam  General: Well developed, well nourished, NAD  HEENT: NCAT, mild Icteic Sclera, mucous membranes moist.     Cardiovascular: S1 S2 auscultated, RRR, no murmurs  Respiratory: Clear to auscultation bilaterally with equal chest rise  Abdomen: Soft, nontender, nondistended, + bowel sounds, drain in place   Extremities: warm dry without cyanosis clubbing or edema  Neuro: AAOx3, nonfocal  Psych: Anxious, however, appropriate  Data Review   Micro Results Recent Results (from the past 240 hour(s))  Body fluid culture     Status: None   Collection Time: 09/15/15 10:34 AM  Result Value Ref Range Status   Specimen Description PERITONEAL FLUID  Final   Special Requests NONE  Final     Gram Stain   Final    FEW WBC PRESENT, PREDOMINANTLY MONONUCLEAR NO ORGANISMS SEEN Results Called to: [place] [id number] SEVERAL TIMES NO ANSWER [id number] [facility]    Culture   Final    NO GROWTH 3 DAYS Performed at [facility]    Report Status 09/18/2015 FINAL  Final  Culture, Urine     Status: None   Collection Time: 09/16/15 12:34 PM  Result Value Ref Range Status   Specimen Description URINE, CLEAN CATCH  Final   Special Requests NONE  Final   Culture   Final    NO GROWTH 1 DAY Performed at [facility]    Report Status 09/17/2015 FINAL  Final  Body fluid culture     Status: None (Preliminary result)   Collection Time: 09/17/15  5:12 PM  Result Value Ref Range Status   Specimen Description BILE  Final   Special Requests Normal  Final   Gram Stain   Final    RARE WBC PRESENT, PREDOMINANTLY MONONUCLEAR NO ORGANISMS SEEN    Culture   Final    FEW CANDIDA ALBICANS CRITICAL RESULT CALLED TO, READ BACK BY AND VERIFIED WITH: R BALDWIN 09/18/15 @ 1247 M VESTAL Performed at [facility]    Report Status PENDING  Incomplete  Culture, body fluid-bottle     Status: None (Preliminary result)   Collection Time: 09/19/15 11:53 AM  Result Value Ref Range Status   Specimen Description FLUID BILE  Final   Special Requests NONE  Final   Gram Stain   Final    GRAM POSITIVE RODS IN BOTH  AEROBIC AND ANAEROBIC BOTTLES CRITICAL RESULT CALLED TO, READ BACK BY AND VERIFIED WITH: MEREDITH @0509 09/20/15 MKELLY    Culture   Final    CULTURE REINCUBATED FOR BETTER GROWTH Performed at [facility]    Report Status PENDING  Incomplete  Gram stain     Status: None   Collection Time: 09/19/15 11:53 AM  Result Value Ref Range Status   Specimen Description FLUID BILE  Final   Special Requests NONE  Final   Gram Stain   Final    NO WBC SEEN FEW GRAM POSITIVE RODS RARE GRAM NEGATIVE RODS RARE BUDDING YEAST SEEN Performed at [facility]    Report Status 09/19/2015 FINAL  Final    Radiology Reports Dg Chest 2 View  09/14/2015  CLINICAL DATA:  Bilateral ankle swelling. Abdominal distention. Back pain. Shallow breathing. EXAM: CHEST  2 VIEW COMPARISON:  None. FINDINGS: Poor inspiration. Normal sized heart. Minimal left lateral pleural thickening. No pleural fluid is seen posteriorly on either side. Small amount of linear density at the left lung base. Mild thoracic spine degenerative changes. IMPRESSION: Poor inspiration with a small amount of pleural and parenchymal scarring at the left lung base. Electronically Signed   By: Steven  Reid M.D.   On: 09/14/2015 19:20   Ct Chest W Contrast  09/15/2015  CLINICAL DATA:  Liver mass diagnosed on CT 1 day prior. Status post paracentesis. Fatigue. Inpatient. EXAM: CT CHEST WITH CONTRAST TECHNIQUE: Multidetector CT imaging of the chest was performed during intravenous contrast administration. CONTRAST:  75mL OMNIPAQUE IOHEXOL 300 MG/ML  SOLN COMPARISON:  09/14/2015 CT abdomen/ pelvis. Chest radiograph from 1 day prior. FINDINGS: Mediastinum/Nodes: Normal heart size. No pericardial fluid/thickening. Left anterior descending and left circumflex coronary atherosclerosis. Great vessels are normal in course and caliber. No central pulmonary emboli. Normal visualized thyroid. Large lower esophageal varices. No pathologically   enlarged  axillary, mediastinal or hilar lymph nodes. Lungs/Pleura: No pneumothorax. Trace left pleural effusion. Subsegmental atelectasis in both lower lobes. No acute consolidative airspace disease, significant pulmonary nodules or lung masses. Upper abdomen: Re- demonstrated is cirrhosis and diffuse severe intrahepatic biliary ductal dilatation, with re- demonstration of central liver 4.7 cm mass extending superiorly and inferiorly along the central bile ducts. There are stable subcentimeter hypodense lesions in the left liver lobe and simple cysts in the right liver lobe. Small volume upper abdominal ascites, decreased. Fat stranding throughout the upper abdominal mesenteric and omental fat, unchanged. Stable dilated visualized proximal common bile duct. Stable mild upper retroperitoneal adenopathy. Musculoskeletal: Stable nondisplaced healing subacute lateral left eighth rib fracture. Mild-to-moderate degenerative changes in thoracic spine. IMPRESSION: 1. No evidence of metastatic disease in the chest. 2. Trace left pleural effusion. Mild bibasilar atelectasis. No acute consolidative airspace disease. 3. Three-vessel coronary atherosclerosis. 4. Stable nondisplaced healing subacute lateral left eighth rib fracture. 5. Re- demonstration of cirrhosis, central liver mass, severe diffuse intrahepatic biliary ductal dilatation, indeterminate subcentimeter hypodense left liver lobe lesions. Small volume upper abdominal ascites, decreased. Electronically Signed   By: Jason A Poff M.D.   On: 09/15/2015 13:47   Ct Abdomen Pelvis W Contrast  09/14/2015  CLINICAL DATA:  Abdominal distention. Back pain. Lower extremity swelling. EXAM: CT ABDOMEN AND PELVIS WITH CONTRAST TECHNIQUE: Multidetector CT imaging of the abdomen and pelvis was performed using the standard protocol following bolus administration of intravenous contrast. CONTRAST:  100mL OMNIPAQUE IOHEXOL 300 MG/ML  SOLN COMPARISON:  None. FINDINGS: Lower chest: No  significant pulmonary nodules or acute consolidative airspace disease. Subsegmental atelectasis in the basilar lower lobes. Hepatobiliary: The liver surface is diffusely prominently irregular, and there is relative hypertrophy of the left liver lobe, in keeping with cirrhosis. There is severe diffuse intrahepatic biliary ductal dilatation. There is a lobulated 4.8 x 3.5 cm mass in the porta hepatis (series 2/ image 25), which demonstrates hypoenhancement relative to the liver parenchyma, and which demonstrates finger-like extensions into the right liver lobe along the biliary tree. 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 Simple appearing 1.9 cm liver cyst in the posterior right liver lobe. Separate simple appearing 3.4 cm liver cyst in the posterior upper right liver lobe. There are at least 2 additional subcentimeter hypodense lesions in the left liver lobe, too small to characterize. Nondistended gallbladder with nonspecific mild diffuse gallbladder wall thickening. The common bile duct is dilated up to 14 mm in diameter proximally with smooth distal tapering. Pancreas: Normal, with no mass or duct dilation. Spleen: Normal size. No mass. Adrenals/Urinary Tract: Normal adrenals. Normal kidneys with no hydronephrosis and no renal mass. Normal bladder. Stomach/Bowel: Grossly normal stomach. Normal caliber small bowel with no small bowel wall thickening. Normal appendix. Normal large bowel with no diverticulosis, large bowel wall thickening or pericolonic fat stranding. Vascular/Lymphatic: Normal caliber abdominal aorta. Patent portal, splenic and renal veins. There are large esophageal varices. There are small paraumbilical varices. There are additional portosystemic varices in the left omentum, which appear to drain into the left pelvic deep veins. There are multiple mildly enlarged porta hepatis nodes, largest 1.4 cm (series 2/ image 30). Reproductive: Normal size prostate with  nonspecific internal prostatic calcifications. Other: No pneumoperitoneum. Moderate to large volume ascites, which measures simple fluid density. Nonspecific fat stranding throughout the greater omentum and mesentery. Musculoskeletal: No aggressive appearing focal osseous lesions. Subacute healing lateral left eighth rib fracture. Mild-to-moderate degenerative changes in the visualized thoracolumbar   spine. IMPRESSION: 1. Cirrhosis. Lobulated 4.8 x 3.5 cm solid mass in the porta hepatis with finger-like extensions into the right liver lobe along the biliary tree. Severe diffuse intrahepatic biliary ductal dilatation. 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 MRI abdomen with and without intravenous contrast could be performed after paracentesis for further evaluation. Recommend GI consultation for ERCP and possible biliary decompression, which may require IR consultation for percutaneous biliary decompression. 2. Large volume ascites. 3. Large esophageal varices. Small paraumbilical varices. Additional portosystemic left omental varices. 4. Nonspecific mild porta hepatis lymphadenopathy. 5. Separate subcentimeter hypodense liver lesions are too small to characterize and could represent liver metastases. 6. Subacute lateral left eighth rib fracture. These results were called by telephone at the time of interpretation on 09/14/2015 at 7:38 pm to Dr. EMILY NGUYEN , who verbally acknowledged these results. Electronically Signed   By: Jason A Poff M.D.   On: 09/14/2015 19:40   Mr 3d Recon At Scanner  09/15/2015  CLINICAL DATA:  Cirrhosis with ascites, distal esophageal varices and lobulated mass in the porta hepatis causing biliary obstruction on CT. EXAM: MRI ABDOMEN WITHOUT AND WITH CONTRAST (INCLUDING MRCP) TECHNIQUE: Multiplanar multisequence MR imaging of the abdomen was performed both before and after the  administration of intravenous contrast. Heavily T2-weighted images of the biliary and pancreatic ducts were obtained, and three-dimensional MRCP images were rendered by post processing. CONTRAST:  20mL MULTIHANCE GADOBENATE DIMEGLUMINE 529 MG/ML IV SOLN COMPARISON:  Abdominal CT 09/14/2015. FINDINGS: Unfortunately, the study is mildly motion degraded due to the patient's inability to completely suspend respiration. Lower chest:  Small left-greater-than-right pleural effusions. Hepatobiliary: The liver contours are diffusely irregular consistent with cirrhosis. There are multiple siderotic nodules throughout the liver with diffuse suppression of hepatic signal on the in phase images consistent with hemosiderosis. There is a 1.6 cm lesion in the medial segment of the left hepatic lobe which demonstrates T1 shortening and low level enhancement following contrast. No other enhancing intrahepatic lesions are identified. There are several hepatic cysts, largest projecting from the dome of the right hepatic lobe, measuring 3.5 cm. As demonstrated on CT, there is a tubular mass within the porta hepatis which appears intra biliary. This demonstrates intermediate T2 signal, low-level enhancement and measures up to 3.8 cm transverse and 6.3 cm in length. There is resulting marked intrahepatic biliary dilatation with the left hepatic duct measuring up to 1.7 cm. The distal common bile duct is normal in caliber. Pancreas: Mildly atrophied. No evidence of pancreatic mass or pancreatic ductal dilatation. Spleen: Normal in size without focal abnormality. Adrenals/Urinary Tract: Both adrenal glands appear normal. Small renal cysts are noted. There is no suspicious renal finding or hydronephrosis. Stomach/Bowel: No evidence of bowel wall thickening, distention or surrounding inflammatory change. Vascular/Lymphatic: Multiple mildly enlarged lymph nodes within the porta hepatis are again noted. There are distal esophageal varices with  additional portosystemic varices in the retroperitoneum and omentum. Other: Ascites has decreased in volume. No suspicious peritoneal enhancement. Musculoskeletal: No acute or significant osseous findings. IMPRESSION: 1. 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 Tissue sampling and biliary decompression recommended. 2. Prominent nonspecific lymph nodes in the porta hepatis, likely related to chronic liver disease. No definite distant metastases. 3. Macronodular cirrhosis with multiple siderotic nodules. There is one T1 hyperintense nodule in the left lobe which demonstrates low level enhancement and could reflect a   dysplastic nodule. 4. Decreased volume of ascites following paracentesis. Electronically Signed   By: Richardean Sale M.D.   On: 09/15/2015 13:52   US Paracentesis  09/15/2015  Ardis Rowan, PA-C     09/15/2015 11:37 AM Successful US guided paracentesis from LLQ. Yielded 6 liters of clear yellow fluid. No immediate complications. Pt tolerated well. Specimen was sent for labs. WENDY S BLAIR PA-C 09/15/2015 11:37 AM   Ir Fluoro Guide Ndl Plmt / Bx  09/20/2015  CLINICAL DATA:  59 year old male with a history of hyperbilirubinemia and biliary obstruction. 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 The patient has undergone a left-sided percutaneous transhepatic cholangiogram and drainage performed 09/16/2015. He has not had normalization of his hyperbilirubinemia. He presents today for a through the tube cholangiogram and a biopsy EXAM: FLUOROSCOPIC GUIDED FINE NEEDLE ASPIRATION OF LIVER TUMOR/BILIARY TUMOR THROUGH THE TUBE CHOLANGIOGRAM EXCHANGE OF INDWELLING LEFT-SIDED BILIARY INTERNAL EXTERNAL DRAIN FLUOROSCOPY TIME:  7 minutes 30 seconds MEDICATIONS AND MEDICAL HISTORY: None ANESTHESIA/SEDATION: 1.5 mg Versed, 75 mcg fentanyl 55 minutes CONTRAST:  81m  OMNIPAQUE IOHEXOL 300 MG/ML  SOLN COMPLICATIONS: None PROCEDURE: Informed consent was obtained from the patient following explanation of the procedure, risks, benefits and alternatives. The patient understands, agrees and consents for the procedure. All questions were addressed. A time out was performed. Maximal barrier sterile technique utilized including caps, mask, sterile gowns, sterile gloves, large sterile drape, hand hygiene, and 1% lidocaine was used for local anesthesia. The patient is positioned supine position on the IR table and the upper abdomen was prepped and draped in the usual sterile fashion. Scout images of the indwelling biliary tube were acquired and were used as a target for fine-needle aspiration of the soft tissues in the adjacent liver and biliary system. Three separate 21 gauge fine-needle Chiba were used for fine-needle aspirations under fluoroscopic guidance. The samples were passed to a cytotechnologist in the room. Initial quick stain was negative, and 3 separate fine-needle aspirations were acquired under fluoroscopy. A Bentson wire was advanced through the indwelling tube into the duodenum, and the tube was removed. An 8 French sheath was advanced over the wire into the biliary system. The dilator was removed and a through the tube cholangiogram was performed with multiple obliquities. A new 10 French internal-external biliary tube was then placed. Final image was stored. Patient tolerated the procedure well and remained hemodynamically stable throughout. No complications were encountered and no significant blood loss was encountered. FINDINGS: Through the tube cholangiogram demonstrates opacification of the majority of the right-sided ductal system, which is in communication with the left-sided ductal system. There is distortion of the ducts at the confluence of the left and the right sided ducts, with a few ducts of segment 6 not opacified, potentially obstructed by the tumor.  Significant common bile duct and common hepatic duct dilation, with a tubular filling defect nearly entirely filling the common hepatic duct and common bile duct to the level of the ampulla. Final image demonstrates placement of a new [place] external biliary tube terminating in the duodenum, with the most proximal side hole within the left-sided ductal system. 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 Tissue specimen sent to pathology for complete histopathologic analysis. Status post through the tube cholangiogram with confirmation of communication of the majority of the right-sided hepatic ducts with the left-sided ducts which remain drained. Status post exchange  of internal/ external biliary drain with placement of a new 10 French drain terminating in the duodenum. Signed, Jaime S. Wagner, DO [facility] Electronically Signed   By: Jaime  Wagner D.O.   On: 09/20/2015 14:21   Mr Abd W/wo Cm/mrcp  09/15/2015  CLINICAL DATA:  Cirrhosis with ascites, distal esophageal varices and lobulated mass in the porta hepatis causing biliary obstruction on CT. EXAM: MRI ABDOMEN WITHOUT AND WITH CONTRAST (INCLUDING MRCP) TECHNIQUE: Multiplanar multisequence MR imaging of the abdomen was performed both before and after the administration of intravenous contrast. Heavily T2-weighted images of the biliary and pancreatic ducts were obtained, and three-dimensional MRCP images were rendered by post processing. CONTRAST:  20mL MULTIHANCE GADOBENATE DIMEGLUMINE 529 MG/ML IV SOLN COMPARISON:  Abdominal CT 09/14/2015. FINDINGS: Unfortunately, the study is mildly motion degraded due to the patient's inability to completely suspend respiration. Lower chest:  Small left-greater-than-right pleural effusions. Hepatobiliary: The liver contours are diffusely irregular consistent with cirrhosis.  There are multiple siderotic nodules throughout the liver with diffuse suppression of hepatic signal on the in phase images consistent with hemosiderosis. There is a 1.6 cm lesion in the medial segment of the left hepatic lobe which demonstrates T1 shortening and low level enhancement following contrast. No other enhancing intrahepatic lesions are identified. There are several hepatic cysts, largest projecting from the dome of the right hepatic lobe, measuring 3.5 cm. As demonstrated on CT, there is a tubular mass within the porta hepatis which appears intra biliary. This demonstrates intermediate T2 signal, low-level enhancement and measures up to 3.8 cm transverse and 6.3 cm in length. There is resulting marked intrahepatic biliary dilatation with the left hepatic duct measuring up to 1.7 cm. The distal common bile duct is normal in caliber. Pancreas: Mildly atrophied. No evidence of pancreatic mass or pancreatic ductal dilatation. Spleen: Normal in size without focal abnormality. Adrenals/Urinary Tract: Both adrenal glands appear normal. Small renal cysts are noted. There is no suspicious renal finding or hydronephrosis. Stomach/Bowel: No evidence of bowel wall thickening, distention or surrounding inflammatory change. Vascular/Lymphatic: Multiple mildly enlarged lymph nodes within the porta hepatis are again noted. 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 Other: Ascites has decreased in volume. No suspicious peritoneal enhancement. Musculoskeletal: No acute or significant osseous findings. IMPRESSION: 1. 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 Tissue sampling and biliary decompression recommended. 2. Prominent nonspecific lymph nodes in the porta hepatis, likely related to chronic liver disease. No definite distant metastases. 3. Macronodular  cirrhosis with multiple siderotic nodules. There is one T1 hyperintense nodule in the left lobe which demonstrates low level enhancement and could reflect a dysplastic nodule. 4. Decreased volume of ascites following paracentesis. Electronically Signed   By: William  Veazey M.D.   On: 09/15/2015 13:52   Ir Int Ext Biliary Drain With Cholangiogram  09/17/2015  CLINICAL DATA:  Biliary obstruction EXAM: IR INT-EXT BILIARY DRAIN W/ CHOLANGIOGRAM; IR BALLOON DILATION OF BILIARY DUCT/AMPULLA FLUOROSCOPY TIME:  9 minutes and 6 seconds MEDICATIONS AND MEDICAL HISTORY: Versed 4 mg, Fentanyl 100 mcg. Additional Medications: None.  Patient was on Rocephin. ANESTHESIA/SEDATION: Moderate sedation time: 60 minutes CONTRAST:  15 cc Omnipaque 300 PROCEDURE: The procedure, risks, benefits, and alternatives were explained to the patient. Questions regarding the procedure were encouraged and answered. The patient understands and consents to the procedure. The upper abdominal region was prepped with Betadine   in a sterile fashion, and a sterile drape was applied covering the operative field. A sterile gown and sterile gloves were used for the procedure. Under sonographic guidance, a Chiba needle was inserted into a left hepatic duct. Contrast was injected opacifying the biliary tree. The needle was removed over a 018 wire. The Accustick transitional dilator was advanced over the wire to the mid biliary tree. It could not be advanced into the central biliary tree secondary to a very resistant partial obstruction. An Amplatz wire was advanced through the transitional dilator. This was advanced into the central biliary tree. Again, the 6 French transitional dilator of the Accustick set could not be advanced across the Amplatz wire. A 4 French glide catheter was advanced across the Amplatz wire into the central biliary tree. The glide catheter was advanced over a Bentson wire into the duodenum then was again exchange for the Amplatz  wire. Initial attempts at dilating across the partial obstruction with serial dilators starting from 5 French were unsuccessful. A 5 French sheath was advanced over the Amplatz into the peripheral biliary tree a 4 French balloon was utilized to dilate the partial obstruction. The tract was then easily dilated to 8 French an 8 French biliary drain was then advanced over the Amplatz into the duodenum. Two additional sideholes were cut above the upper marker. It was looped and string fixed in the duodenum then sewn to the skin. The initial aspirate was sent for a cytology analysis. Contrast was injected. FINDINGS: Percutaneous cholangiography into the left the biliary tree demonstrates very limited opacification the the bile ducts. This was performed in an attempt to prevent sepsis. Final images demonstrate left internal external biliary drain placement with its tip coiled in the duodenum. Central left-sided ducts only were opacified by contrast. COMPLICATIONS: None IMPRESSION: Successful left internal external biliary drain placement. An 8 French device was placed secondary to a resistant biliary obstruction. Initial bile aspirate was sent for cytology. Electronically Signed   By: Arthur  Hoss M.D.   On: 09/17/2015 08:33   Ir Exchange Biliary Drain  09/20/2015  CLINICAL DATA:  59-year-old male with a history of hyperbilirubinemia and biliary obstruction. 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 The patient has undergone a left-sided percutaneous transhepatic cholangiogram and drainage performed 09/16/2015. He has not had normalization of his hyperbilirubinemia. He presents today for a through the tube cholangiogram and a biopsy EXAM: FLUOROSCOPIC GUIDED FINE NEEDLE ASPIRATION OF LIVER TUMOR/BILIARY TUMOR THROUGH THE TUBE CHOLANGIOGRAM EXCHANGE OF INDWELLING LEFT-SIDED BILIARY INTERNAL EXTERNAL DRAIN FLUOROSCOPY TIME:  7 minutes 30 seconds MEDICATIONS AND MEDICAL HISTORY:  None ANESTHESIA/SEDATION: 1.5 mg Versed, 75 mcg fentanyl 55 minutes CONTRAST:  25mL OMNIPAQUE IOHEXOL 300 MG/ML  SOLN COMPLICATIONS: None PROCEDURE: Informed consent was obtained from the patient following explanation of the procedure, risks, benefits and alternatives. The patient understands, agrees and consents for the procedure. All questions were addressed. A time out was performed. Maximal barrier sterile technique utilized including caps, mask, sterile gowns, sterile gloves, large sterile drape, hand hygiene, and 1% lidocaine was used for local anesthesia. The patient is positioned supine position on the IR table and the upper abdomen was prepped and draped in the usual sterile fashion. Scout images of the indwelling biliary tube were acquired and were used as a target for fine-needle aspiration of the soft tissues in the adjacent liver and biliary system. Three separate 21 gauge fine-needle Chiba were used for fine-needle aspirations under fluoroscopic   guidance. The samples were passed to a cytotechnologist in the room. Initial quick stain was negative, and 3 separate fine-needle aspirations were acquired under fluoroscopy. A Bentson wire was advanced through the indwelling tube into the duodenum, and the tube was removed. An 8 French sheath was advanced over the wire into the biliary system. The dilator was removed and a through the tube cholangiogram was performed with multiple obliquities. A new 10 French internal-external biliary tube was then placed. Final image was stored. Patient tolerated the procedure well and remained hemodynamically stable throughout. No complications were encountered and no significant blood loss was encountered. FINDINGS: Through the tube cholangiogram demonstrates opacification of the majority of the right-sided ductal system, which is in communication with the left-sided ductal system. There is distortion of the ducts at the confluence of the left and the right sided ducts,  with a few ducts of segment 6 not opacified, potentially obstructed by the tumor. Significant common bile duct and common hepatic duct dilation, with a tubular filling defect nearly entirely filling the common hepatic duct and common bile duct to the level of the ampulla. Final image demonstrates placement of a new 10 French internal external biliary tube terminating in the duodenum, with the most proximal side hole within the left-sided ductal system. 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 Tissue specimen sent to pathology for complete histopathologic analysis. Status post through the tube cholangiogram with confirmation of communication of the majority of the right-sided hepatic ducts with the left-sided ducts which remain drained. Status post exchange of internal/ external biliary drain with placement of a new 10 French drain terminating in the duodenum. Signed, Jaime S. Wagner, DO [facility] Electronically Signed   By: Jaime  Wagner D.O.   On: 09/20/2015 14:21   Ir Balloon Dilation Of Biliary Ducts/ampulla  09/17/2015  CLINICAL DATA:  Biliary obstruction EXAM: IR INT-EXT BILIARY DRAIN W/ CHOLANGIOGRAM; IR BALLOON DILATION OF BILIARY DUCT/AMPULLA FLUOROSCOPY TIME:  9 minutes and 6 seconds MEDICATIONS AND MEDICAL HISTORY: Versed 4 mg, Fentanyl 100 mcg. Additional Medications: None.  Patient was on Rocephin. ANESTHESIA/SEDATION: Moderate sedation time: 60 minutes CONTRAST:  15 cc Omnipaque 300 PROCEDURE: The procedure, risks, benefits, and alternatives were explained to the patient. Questions regarding the procedure were encouraged and answered. The patient understands and consents to the procedure. The upper abdominal region was prepped with Betadine in a sterile fashion, and a sterile drape was applied covering the operative field. A sterile gown and sterile gloves were  used for the procedure. Under sonographic guidance, a Chiba needle was inserted into a left hepatic duct. Contrast was injected opacifying the biliary tree. The needle was removed over a 018 wire. The Accustick transitional dilator was advanced over the wire to the mid biliary tree. It could not be advanced into the central biliary tree secondary to a very resistant partial obstruction. An Amplatz wire was advanced through the transitional dilator. This was advanced into the central biliary tree. Again, the 6 French transitional dilator of the Accustick set could not be advanced across the Amplatz wire. A 4 French glide catheter was advanced across the Amplatz wire into the central biliary tree. The glide catheter was advanced over a Bentson wire into the duodenum then was again exchange for the Amplatz wire. Initial attempts at dilating across the partial obstruction with serial dilators starting from 5 French were unsuccessful. A 5 French sheath was advanced over   the Amplatz into the peripheral biliary tree a 4 French balloon was utilized to dilate the partial obstruction. The tract was then easily dilated to 8 [place] an 8 [place] biliary drain was then advanced over the Amplatz into the duodenum. Two additional sideholes were cut above the upper marker. It was looped and string fixed in the duodenum then sewn to the skin. The initial aspirate was sent for a cytology analysis. Contrast was injected. FINDINGS: Percutaneous cholangiography into the left the biliary tree demonstrates very limited opacification the the bile ducts. This was performed in an attempt to prevent sepsis. Final images demonstrate left internal external biliary drain placement with its tip coiled in the duodenum. Central left-sided ducts only were opacified by contrast. COMPLICATIONS: None IMPRESSION: Successful left internal external biliary drain placement. An 8 French device was placed secondary to a resistant biliary obstruction. Initial  bile aspirate was sent for cytology. Electronically Signed   By: Marybelle Killings M.D.   On: 09/17/2015 08:33    CBC  Recent Labs Lab 09/16/15 0430 09/17/15 0418 09/18/15 0400 09/19/15 0355 09/20/15 0530  WBC 5.5 7.0 13.4* 19.6* 17.8*  HGB 12.8* 13.4 14.8 14.3 14.1  HCT 36.5* 37.6* 40.9 40.4 39.7  PLT 106* 119* 133* 125* 139*  MCV 102.5* 103.3* 102.8* 103.3* 102.6*  MCH 36.0* 36.8* 37.2* 36.6* 36.4*  MCHC 35.1 35.6 36.2* 35.4 35.5  RDW 14.3 14.2 14.1 14.1 13.9    Chemistries   Recent Labs Lab 09/16/15 0430 09/17/15 0418 09/18/15 0400 09/19/15 0355 09/20/15 0530  NA 133* 132* 132* 128* 125*  K 4.2 4.1 4.1 4.0 4.4  CL 101 99* 92* 89* 88*  CO2 27 28 32 33* 30  GLUCOSE 108* 105* 107* 116* 82  BUN _0 31*  CREATININE 0.62 0.68 0.80 0.75 1.13  CALCIUM 8.2* 9.0 9.4 9.0 8.8*  MG  --   --  1.9  --   --   AST 109* 91* 72* 51* 53*  ALT 50 46 43 34 31  ALKPHOS 177* 170* 171* 148* 129*  BILITOT 3.6* 3.6* 2.8* 3.0* 3.8*   ------------------------------------------------------------------------------------------------------------------ estimated creatinine clearance is 72.5 mL/min (by C-G formula based on Cr of 1.13). ------------------------------------------------------------------------------------------------------------------ No results for input(s): HGBA1C in the last 72 hours. ------------------------------------------------------------------------------------------------------------------ No results for input(s): CHOL, HDL, LDLCALC, TRIG, CHOLHDL, LDLDIRECT in the last 72 hours. ------------------------------------------------------------------------------------------------------------------ No results for input(s): TSH, T4TOTAL, T3FREE, THYROIDAB in the last 72 hours.  Invalid input(s): FREET3 ------------------------------------------------------------------------------------------------------------------ No results for input(s): VITAMINB12, FOLATE, FERRITIN,  TIBC, IRON, RETICCTPCT in the last 72 hours.  Coagulation profile  Recent Labs Lab 09/15/15 1208 09/18/15 0400 09/19/15 0355 09/20/15 0530  INR 1.57* 1.48 1.68* 1.64*    No results for input(s): DDIMER in the last 72 hours.  Cardiac Enzymes No results for input(s): CKMB, TROPONINI, MYOGLOBIN in the last 168 hours.  Invalid input(s): CK ------------------------------------------------------------------------------------------------------------------ Invalid input(s): POCBNP    Etty Isaac, [place] D.O. on 09/21/2015 at 2:23 PM  Between 7am to 7pm - Pager - [phone]  After 7pm go to [email or web address] - password [email or web address]  And look for the night coverage person covering for me after hours  [facility] Office  [phone]

## 2015-09-22 LAB — CULTURE, BODY FLUID W GRAM STAIN -BOTTLE

## 2015-09-22 LAB — CBC WITH DIFFERENTIAL/PLATELET
BASOS PCT: 1 %
Basophils Absolute: 0.1 10*3/uL (ref 0.0–0.1)
EOS ABS: 0.3 10*3/uL (ref 0.0–0.7)
EOS PCT: 2 %
HCT: 38 % — ABNORMAL LOW (ref 39.0–52.0)
HEMOGLOBIN: 13.3 g/dL (ref 13.0–17.0)
Lymphocytes Relative: 11 %
Lymphs Abs: 1.4 10*3/uL (ref 0.7–4.0)
MCH: 36 pg — ABNORMAL HIGH (ref 26.0–34.0)
MCHC: 35 g/dL (ref 30.0–36.0)
MCV: 103 fL — ABNORMAL HIGH (ref 78.0–100.0)
Monocytes Absolute: 1.9 10*3/uL — ABNORMAL HIGH (ref 0.1–1.0)
Monocytes Relative: 15 %
NEUTROS PCT: 71 %
Neutro Abs: 8.9 10*3/uL — ABNORMAL HIGH (ref 1.7–7.7)
Platelets: 135 10*3/uL — ABNORMAL LOW (ref 150–400)
RBC: 3.69 MIL/uL — AB (ref 4.22–5.81)
RDW: 13.6 % (ref 11.5–15.5)
WBC: 12.5 10*3/uL — AB (ref 4.0–10.5)

## 2015-09-22 LAB — BODY FLUID CULTURE: SPECIAL REQUESTS: NORMAL

## 2015-09-22 LAB — COMPREHENSIVE METABOLIC PANEL
ALBUMIN: 2 g/dL — AB (ref 3.5–5.0)
ALK PHOS: 113 U/L (ref 38–126)
ALT: 34 U/L (ref 17–63)
AST: 64 U/L — AB (ref 15–41)
Anion gap: 6 (ref 5–15)
BILIRUBIN TOTAL: 3.7 mg/dL — AB (ref 0.3–1.2)
BUN: 29 mg/dL — AB (ref 6–20)
CALCIUM: 9 mg/dL (ref 8.9–10.3)
CO2: 34 mmol/L — ABNORMAL HIGH (ref 22–32)
CREATININE: 0.9 mg/dL (ref 0.61–1.24)
Chloride: 87 mmol/L — ABNORMAL LOW (ref 101–111)
GFR calc Af Amer: >60 mL/min (ref >60)
GLUCOSE: 98 mg/dL (ref 65–99)
POTASSIUM: 4.3 mmol/L (ref 3.5–5.1)
Sodium: 127 mmol/L — ABNORMAL LOW (ref 135–145)
TOTAL PROTEIN: 7 g/dL (ref 6.5–8.1)

## 2015-09-22 LAB — PROTIME-INR
INR: 1.64 — ABNORMAL HIGH (ref 0.00–1.49)
PROTHROMBIN TIME: 19.4 s — AB (ref 11.6–15.2)

## 2015-09-22 LAB — CULTURE, BODY FLUID-BOTTLE

## 2015-09-22 MED ORDER — SIMETHICONE 80 MG PO CHEW
160.0000 mg | CHEWABLE_TABLET | Freq: Once | ORAL | Status: AC
  Administered 2015-09-22: 160 mg via ORAL
  Filled 2015-09-22: qty 2

## 2015-09-22 MED ORDER — AMOXICILLIN-POT CLAVULANATE 875-125 MG PO TABS
1.0000 | ORAL_TABLET | Freq: Two times a day (BID) | ORAL | Status: DC
  Administered 2015-09-22 – 2015-09-23 (×2): 1 via ORAL
  Filled 2015-09-22 (×3): qty 1

## 2015-09-22 MED ORDER — FLUCONAZOLE 100 MG PO TABS
200.0000 mg | ORAL_TABLET | Freq: Every day | ORAL | Status: DC
  Administered 2015-09-22 – 2015-09-23 (×2): 200 mg via ORAL
  Filled 2015-09-22 (×2): qty 2

## 2015-09-22 NOTE — Progress Notes (Signed)
[facility]                                                                              HPI on 09/14/2015 by Dr. Ernestene Kiel Eric Schroeder is a 59 y.o. male with no significant past medical history who comes to the emergency department with complaints of abdominal distention, abdominal pain, bilateral flank pain, lower extremity edema for about 2 weeks. This was preceded by progressively worse bloating, early satiety for most of this year. He also endorses a 35 pound weight loss during this timeframe as well. He states that he adjusted his eating habits to this, did not think that there was any major health issue on until his abdominal distention and lower extremity edema. He denies chest pain, palpitations, dizziness, diaphoresis, PND, but complains of dyspnea and orthopnea (he feels his "abdomen is pushing on his lungs"). He denies fever, chills, but feels fatigued.  Interim history Found to have liver mass.  IR placed drain. GI and Onc consulted. Pending further recommendations.  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 PBC -Hepatitis A antibody +, otherwise hepatitis panel unremarkable -Cytology showed no malignant cells -Oncology consulted and appreciated  -S/p PTC by IR on 12/5 (output >3L).   -still high drain output ~ 1.00 liters - patient had exchange of previous 8 [place] biliary drain with 10 [place] drain 12/9  and brushings were performed at that time as well - await preliminary pathology findings and gradually diet  - discussed with patient -needs very close follow-up with interventional, GI and oncology as an outpatient once diagnosis is known -potentially can d/c home in am after checked on by IR and follow in drain clinic  Elevated bilirubin, AST, INR -Likely secondary to the above -Patient given FFP earlier this admit -Continue to monitor CMP  Hyponatremia 2/2 to ongoing biliary losses Has been on d5 so switched to NS IV saline 75 cc/hr  sodium is currently improved from 122-125->127  Urinary tract infection -UA showed 0-5 WBC, positive nitrites leukocytes, few bacteria -Patient was started on ceftriaxone 12/3 -Urine culture shows no growth to date (was not collected upon admission before antibiotics started)   leukocytosis   initial Gram stain 12/10 shows yeast as well as gram-positive cocci Await speciation as well as sensitivities -Fluid culture shows some yeast so Fluconazole started along with Ceftriaxone  fluid cultures non-suggestive of bacterial pathogen-narrowed after discussion 12/11 with ID to Augmentin and Diflucan to complete a 2 weeks course  ceftriaxone changed on 12/8 2 vancomycin and Zosyn for intra-abdominal coverage given white count 17-19 range and gram-positive cocci in chains noted Rpt labs in am to monitor for resolution  Code Status: Full  Family Communication: d/w Gf at bedside 12/10  Disposition Plan: Admitted. Pending further recommendations from IR and Oncology  Time Spent in minutes   25 minutes  Procedures  US guided paracentesis MRCP PCT  Consults   Gastroenterology Interventional radiology Oncology   DVT Prophylaxis  SCDs  Lab Results  Component Value Date   PLT 135* 09/22/2015    Medications  Scheduled Meds: . feeding supplement (ENSURE ENLIVE)  237 mL Oral BID BM  . fluconazole (DIFLUCAN) IV  400 mg Intravenous Q24H  .  oxyCODONE  10 mg Oral Q12H  . piperacillin-tazobactam (ZOSYN)  IV  3.375 g Intravenous Q8H  . sodium chloride  3 mL Intravenous Q12H  . vancomycin  1,000 mg Intravenous Q12H   Continuous Infusions: . sodium chloride 75 mL/hr at 09/20/15 2100   PRN Meds:.sodium chloride, LORazepam, ondansetron **OR** ondansetron (ZOFRAN) IV, sodium chloride  Antibiotics    Anti-infectives    Start     Dose/Rate Route Frequency Ordered Stop   09/20/15 1000  piperacillin-tazobactam (ZOSYN) IVPB 3.375 g     3.375 g 12.5 mL/hr over 240 Minutes Intravenous Every 8 hours 09/20/15 0819     09/20/15 0900  vancomycin (VANCOCIN) IVPB 1000 mg/200 mL premix     1,000 mg 200 mL/hr over 60 Minutes Intravenous Every 12 hours 09/20/15 0820     09/19/15 1600  fluconazole (DIFLUCAN) IVPB 400 mg     400 mg 100 mL/hr over 120 Minutes Intravenous Every 24 hours 09/18/15 1410     09/18/15 1500  fluconazole (DIFLUCAN) IVPB 800 mg     800 mg 200 mL/hr over 120 Minutes Intravenous  Once 09/18/15 1410 09/18/15 1716   09/15/15 2000  cefTRIAXone (ROCEPHIN) 1 g in dextrose 5 % 50 mL IVPB  Status:  Discontinued     1 g 100 mL/hr over 30 Minutes Intravenous Every 24 hours 09/14/15 2230 09/20/15 0730   09/14/15 2045  cefTRIAXone (ROCEPHIN) 1 g in dextrose 5 % 50 mL IVPB     1 g 100 mL/hr over 30 Minutes Intravenous  Once 09/14/15 2031 09/14/15 2111      Subjective:   No issues Sleepy today No cp No n/v tol diet No fever no chills   Objective:   Filed Vitals:   09/21/15 2155 09/22/15 0545 09/22/15 0553 09/22/15 1250  BP: 123/72 118/68 99/40 110/61  Pulse: 95 92 64 67  Temp: 98.4 F (36.9 C) 98.5 F (36.9 C) 97.6 F (36.4 C) 97.8 F (36.6 C)  TempSrc: Oral Oral Oral Oral  Resp: $Remo'18 17 17 18  'FMoVM$ Height:      Weight:  103 kg (227 lb 1.2 oz)    SpO2: 100% 100% 96% 97%    Wt Readings from Last 3 Encounters:  09/22/15 103 kg (227 lb 1.2 oz)     Intake/Output Summary (Last 24 hours) at 09/22/15 1453 Last data  filed at 09/22/15 1300  Gross per 24 hour  Intake 1978.9 ml  Output   3750 ml  Net -1771.1 ml    Exam  General: Well developed, well nourished, NAD  HEENT: NCAT, mild Icteic Sclera, mucous membranes moist.   Cardiovascular: S1 S2 auscultated, RRR, no murmurs  Respiratory: Clear to auscultation bilaterally with equal chest rise  Abdomen: Soft, nontender, nondistended, + bowel sounds, drain in place   Extremities: warm dry without cyanosis clubbing or edema   Data Review   Micro Results Recent Results (from the past 240 hour(s))  Body fluid culture     Status: None   Collection Time:  09/15/15 10:34 AM  Result Value Ref Range Status   Specimen Description PERITONEAL FLUID  Final   Special Requests NONE  Final   Gram Stain   Final    FEW WBC PRESENT, PREDOMINANTLY MONONUCLEAR NO ORGANISMS SEEN Results Called to: [place] [address] SEVERAL TIMES NO ANSWER [id number] [place]    Culture   Final    NO GROWTH 3 DAYS Performed at [facility]    Report Status 09/18/2015 FINAL  Final  Culture, Urine     Status: None   Collection Time: 09/16/15 12:34 PM  Result Value Ref Range Status   Specimen Description URINE, CLEAN CATCH  Final   Special Requests NONE  Final   Culture   Final    NO GROWTH 1 DAY Performed at [facility]    Report Status 09/17/2015 FINAL  Final  Body fluid culture     Status: None   Collection Time: 09/17/15  5:12 PM  Result Value Ref Range Status   Specimen Description BILE  Final   Special Requests Normal  Final   Gram Stain   Final    RARE WBC PRESENT, PREDOMINANTLY MONONUCLEAR NO ORGANISMS SEEN    Culture   Final    FEW CANDIDA ALBICANS CRITICAL RESULT CALLED TO, READ BACK BY AND VERIFIED WITH: R BALDWIN 09/18/15 @ 59 M VESTAL Performed at [facility]    Report Status 09/22/2015 FINAL  Final  Culture, body fluid-bottle     Status: None   Collection Time: 09/19/15 11:53 AM  Result Value Ref Range Status   Specimen  Description FLUID BILE  Final   Special Requests NONE  Final   Gram Stain   Final    GRAM POSITIVE RODS IN BOTH AEROBIC AND ANAEROBIC BOTTLES CRITICAL RESULT CALLED TO, READ BACK BY AND VERIFIED WITH: MEREDITH $RemoveBefore'[id number]$  09/20/15 MKELLY    Culture   Final    MULTIPLE ORGANISMS PRESENT, NONE PREDOMINANT Performed at [facility]    Report Status 09/22/2015 FINAL  Final  Gram stain     Status: None   Collection Time: 09/19/15 11:53 AM  Result Value Ref Range Status   Specimen Description FLUID BILE  Final   Special Requests NONE  Final   Gram Stain   Final    NO WBC SEEN FEW GRAM POSITIVE RODS RARE GRAM NEGATIVE RODS RARE BUDDING YEAST SEEN Performed at [facility]    Report Status 09/19/2015 FINAL  Final    Radiology Reports Dg Chest 2 View  09/14/2015  CLINICAL DATA:  Bilateral ankle swelling. Abdominal distention. Back pain. Shallow breathing. EXAM: CHEST  2 VIEW COMPARISON:  None. FINDINGS: Poor inspiration. Normal sized heart. Minimal left lateral pleural thickening. No pleural fluid is seen posteriorly on either side. Small amount of linear density at the left lung base. Mild thoracic spine degenerative changes. IMPRESSION: Poor inspiration with a small amount of pleural and parenchymal scarring at the left lung base. Electronically Signed   By: Claudie Revering M.D.   On: 09/14/2015 19:20   Ct Chest W Contrast  09/15/2015  CLINICAL DATA:  Liver mass diagnosed on CT 1 day prior. Status post paracentesis. Fatigue. Inpatient. EXAM: CT CHEST WITH CONTRAST TECHNIQUE: Multidetector CT imaging of the chest was performed during intravenous contrast administration. CONTRAST:  25mL OMNIPAQUE IOHEXOL 300 MG/ML  SOLN COMPARISON:  09/14/2015 CT abdomen/ pelvis. Chest radiograph from 1 day prior. FINDINGS: Mediastinum/Nodes: Normal heart size. No pericardial fluid/thickening. Left anterior descending and left  circumflex coronary atherosclerosis. Great vessels are normal in course and  caliber. No central pulmonary emboli. Normal visualized thyroid. Large lower esophageal varices. No pathologically enlarged axillary, mediastinal or hilar lymph nodes. Lungs/Pleura: No pneumothorax. Trace left pleural effusion. Subsegmental atelectasis in both lower lobes. No acute consolidative airspace disease, significant pulmonary nodules or lung masses. Upper abdomen: Re- demonstrated is cirrhosis and diffuse severe intrahepatic biliary ductal dilatation, with re- demonstration of central liver 4.7 cm mass extending superiorly and inferiorly along the central bile ducts. There are stable subcentimeter hypodense lesions in the left liver lobe and simple cysts in the right liver lobe. Small volume upper abdominal ascites, decreased. Fat stranding throughout the upper abdominal mesenteric and omental fat, unchanged. Stable dilated visualized proximal common bile duct. Stable mild upper retroperitoneal adenopathy. Musculoskeletal: Stable nondisplaced healing subacute lateral left eighth rib fracture. Mild-to-moderate degenerative changes in thoracic spine. IMPRESSION: 1. No evidence of metastatic disease in the chest. 2. Trace left pleural effusion. Mild bibasilar atelectasis. No acute consolidative airspace disease. 3. Three-vessel coronary atherosclerosis. 4. Stable nondisplaced healing subacute lateral left eighth rib fracture. 5. Re- demonstration of cirrhosis, central liver mass, severe diffuse intrahepatic biliary ductal dilatation, indeterminate subcentimeter hypodense left liver lobe lesions. Small volume upper abdominal ascites, decreased. Electronically Signed   By: Ilona Sorrel M.D.   On: 09/15/2015 13:47   Ct Abdomen Pelvis W Contrast  09/14/2015  CLINICAL DATA:  Abdominal distention. Back pain. Lower extremity swelling. EXAM: CT ABDOMEN AND PELVIS WITH CONTRAST TECHNIQUE: Multidetector CT imaging of the abdomen and pelvis was performed using the standard protocol following bolus administration of  intravenous contrast. CONTRAST:  154mL OMNIPAQUE IOHEXOL 300 MG/ML  SOLN COMPARISON:  None. FINDINGS: Lower chest: No significant pulmonary nodules or acute consolidative airspace disease. Subsegmental atelectasis in the basilar lower lobes. Hepatobiliary: The liver surface is diffusely prominently irregular, and there is relative hypertrophy of the left liver lobe, in keeping with cirrhosis. There is severe diffuse intrahepatic biliary ductal dilatation. There is a lobulated 4.8 x 3.5 cm mass in the porta hepatis (series 2/ image 25), which demonstrates hypoenhancement relative to the liver parenchyma, and which demonstrates finger-like extensions into the right liver lobe along the biliary tree. 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 Simple appearing 1.9 cm liver cyst in the posterior right liver lobe. Separate simple appearing 3.4 cm liver cyst in the posterior upper right liver lobe. There are at least 2 additional subcentimeter hypodense lesions in the left liver lobe, too small to characterize. Nondistended gallbladder with nonspecific mild diffuse gallbladder wall thickening. The common bile duct is dilated up to 14 mm in diameter proximally with smooth distal tapering. Pancreas: Normal, with no mass or duct dilation. Spleen: Normal size. No mass. Adrenals/Urinary Tract: Normal adrenals. Normal kidneys with no hydronephrosis and no renal mass. Normal bladder. Stomach/Bowel: Grossly normal stomach. Normal caliber small bowel with no small bowel wall thickening. Normal appendix. Normal large bowel with no diverticulosis, large bowel wall thickening or pericolonic fat stranding. Vascular/Lymphatic: Normal caliber abdominal aorta. Patent portal, splenic and renal veins. There are large esophageal varices. There are small paraumbilical varices. There are additional portosystemic varices in the left omentum, which appear to drain into the left pelvic deep veins. There are  multiple mildly enlarged porta hepatis nodes, largest 1.4 cm (series 2/ image 30). Reproductive: Normal size prostate with nonspecific internal prostatic calcifications. Other: No pneumoperitoneum. Moderate to large volume ascites, which measures simple fluid density. Nonspecific fat stranding throughout the greater  omentum and mesentery. Musculoskeletal: No aggressive appearing focal osseous lesions. Subacute healing lateral left eighth rib fracture. Mild-to-moderate degenerative changes in the visualized thoracolumbar spine. IMPRESSION: 1. Cirrhosis. Lobulated 4.8 x 3.5 cm solid mass in the porta hepatis with finger-like extensions into the right liver lobe along the biliary tree. Severe diffuse intrahepatic biliary ductal dilatation. 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 MRI abdomen with and without intravenous contrast could be performed after paracentesis for further evaluation. Recommend GI consultation for ERCP and possible biliary decompression, which may require IR consultation for percutaneous biliary decompression. 2. Large volume ascites. 3. Large esophageal varices. Small paraumbilical varices. Additional portosystemic left omental varices. 4. Nonspecific mild porta hepatis lymphadenopathy. 5. Separate subcentimeter hypodense liver lesions are too small to characterize and could represent liver metastases. 6. Subacute lateral left eighth rib fracture. These results were called by telephone at the time of interpretation on 09/14/2015 at 7:38 pm to Dr. Lonia Skinner , who verbally acknowledged these results. Electronically Signed   By: Ilona Sorrel M.D.   On: 09/14/2015 19:40   Mr 3d Recon At Scanner  09/15/2015  CLINICAL DATA:  Cirrhosis with ascites, distal esophageal varices and lobulated mass in the porta hepatis causing biliary obstruction on CT. EXAM: MRI ABDOMEN WITHOUT AND WITH CONTRAST  (INCLUDING MRCP) TECHNIQUE: Multiplanar multisequence MR imaging of the abdomen was performed both before and after the administration of intravenous contrast. Heavily T2-weighted images of the biliary and pancreatic ducts were obtained, and three-dimensional MRCP images were rendered by post processing. CONTRAST:  70mL MULTIHANCE GADOBENATE DIMEGLUMINE 529 MG/ML IV SOLN COMPARISON:  Abdominal CT 09/14/2015. FINDINGS: Unfortunately, the study is mildly motion degraded due to the patient's inability to completely suspend respiration. Lower chest:  Small left-greater-than-right pleural effusions. Hepatobiliary: The liver contours are diffusely irregular consistent with cirrhosis. There are multiple siderotic nodules throughout the liver with diffuse suppression of hepatic signal on the in phase images consistent with hemosiderosis. There is a 1.6 cm lesion in the medial segment of the left hepatic lobe which demonstrates T1 shortening and low level enhancement following contrast. No other enhancing intrahepatic lesions are identified. There are several hepatic cysts, largest projecting from the dome of the right hepatic lobe, measuring 3.5 cm. As demonstrated on CT, there is a tubular mass within the porta hepatis which appears intra biliary. This demonstrates intermediate T2 signal, low-level enhancement and measures up to 3.8 cm transverse and 6.3 cm in length. There is resulting marked intrahepatic biliary dilatation with the left hepatic duct measuring up to 1.7 cm. The distal common bile duct is normal in caliber. Pancreas: Mildly atrophied. No evidence of pancreatic mass or pancreatic ductal dilatation. Spleen: Normal in size without focal abnormality. Adrenals/Urinary Tract: Both adrenal glands appear normal. Small renal cysts are noted. There is no suspicious renal finding or hydronephrosis. Stomach/Bowel: No evidence of bowel wall thickening, distention or surrounding inflammatory change. Vascular/Lymphatic:  Multiple mildly enlarged lymph nodes within the porta hepatis are again noted. 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 Other: Ascites has decreased in volume. No suspicious peritoneal enhancement. Musculoskeletal: No acute or significant osseous findings. IMPRESSION: 1. 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 Tissue sampling and biliary decompression recommended. 2. Prominent nonspecific lymph nodes in the porta hepatis, likely related to chronic liver disease. No definite distant metastases. 3. Macronodular  cirrhosis with multiple siderotic nodules. There is one T1 hyperintense nodule in the left lobe which demonstrates low level enhancement and could reflect a dysplastic nodule. 4. Decreased volume of ascites following paracentesis. Electronically Signed   By: Richardean Sale M.D.   On: 09/15/2015 13:52   US Paracentesis  09/15/2015  Ardis Rowan, PA-C     09/15/2015 11:37 AM Successful US guided paracentesis from LLQ. Yielded 6 liters of clear yellow fluid. No immediate complications. Pt tolerated well. Specimen was sent for labs. WENDY S BLAIR PA-C 09/15/2015 11:37 AM   Ir Fluoro Guide Ndl Plmt / Bx  09/20/2015  CLINICAL DATA:  59 year old male with a history of hyperbilirubinemia and biliary obstruction. 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 The patient has undergone a left-sided percutaneous transhepatic cholangiogram and drainage performed 09/16/2015. He has not had normalization of his hyperbilirubinemia. He presents today for a through the tube cholangiogram and a biopsy EXAM: FLUOROSCOPIC GUIDED FINE NEEDLE ASPIRATION OF LIVER TUMOR/BILIARY TUMOR THROUGH THE TUBE CHOLANGIOGRAM EXCHANGE OF INDWELLING LEFT-SIDED BILIARY INTERNAL EXTERNAL DRAIN FLUOROSCOPY TIME:  7 minutes 30  seconds MEDICATIONS AND MEDICAL HISTORY: None ANESTHESIA/SEDATION: 1.5 mg Versed, 75 mcg fentanyl 55 minutes CONTRAST:  89mL OMNIPAQUE IOHEXOL 300 MG/ML  SOLN COMPLICATIONS: None PROCEDURE: Informed consent was obtained from the patient following explanation of the procedure, risks, benefits and alternatives. The patient understands, agrees and consents for the procedure. All questions were addressed. A time out was performed. Maximal barrier sterile technique utilized including caps, mask, sterile gowns, sterile gloves, large sterile drape, hand hygiene, and 1% lidocaine was used for local anesthesia. The patient is positioned supine position on the IR table and the upper abdomen was prepped and draped in the usual sterile fashion. Scout images of the indwelling biliary tube were acquired and were used as a target for fine-needle aspiration of the soft tissues in the adjacent liver and biliary system. Three separate 21 gauge fine-needle Chiba were used for fine-needle aspirations under fluoroscopic guidance. The samples were passed to a cytotechnologist in the room. Initial quick stain was negative, and 3 separate fine-needle aspirations were acquired under fluoroscopy. A Bentson wire was advanced through the indwelling tube into the duodenum, and the tube was removed. An 8 French sheath was advanced over the wire into the biliary system. The dilator was removed and a through the tube cholangiogram was performed with multiple obliquities. A new 10 French internal-external biliary tube was then placed. Final image was stored. Patient tolerated the procedure well and remained hemodynamically stable throughout. No complications were encountered and no significant blood loss was encountered. FINDINGS: Through the tube cholangiogram demonstrates opacification of the majority of the right-sided ductal system, which is in communication with the left-sided ductal system. There is distortion of the ducts at the confluence  of the left and the right sided ducts, with a few ducts of segment 6 not opacified, potentially obstructed by the tumor. Significant common bile duct and common hepatic duct dilation, with a tubular filling defect nearly entirely filling the common hepatic duct and common bile duct to the level of the ampulla. Final image demonstrates placement of a new 70 [place] internal external biliary tube terminating in the duodenum, with the most proximal side hole within the left-sided ductal system. 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 Tissue specimen sent to pathology for complete histopathologic analysis. Status post through the  tube cholangiogram with confirmation of communication of the majority of the right-sided hepatic ducts with the left-sided ducts which remain drained. Status post exchange of internal/ external biliary drain with placement of a new 10 French drain terminating in the duodenum. Signed, Dulcy Fanny. Earleen Newport, DO [facility] Electronically Signed   By: Corrie Mckusick D.O.   On: 09/20/2015 14:21   Mr Abd W/wo Cm/mrcp  09/15/2015  CLINICAL DATA:  Cirrhosis with ascites, distal esophageal varices and lobulated mass in the porta hepatis causing biliary obstruction on CT. EXAM: MRI ABDOMEN WITHOUT AND WITH CONTRAST (INCLUDING MRCP) TECHNIQUE: Multiplanar multisequence MR imaging of the abdomen was performed both before and after the administration of intravenous contrast. Heavily T2-weighted images of the biliary and pancreatic ducts were obtained, and three-dimensional MRCP images were rendered by post processing. CONTRAST:  44mL MULTIHANCE GADOBENATE DIMEGLUMINE 529 MG/ML IV SOLN COMPARISON:  Abdominal CT 09/14/2015. FINDINGS: Unfortunately, the study is mildly motion degraded due to the patient's inability to completely suspend respiration. Lower chest:  Small  left-greater-than-right pleural effusions. Hepatobiliary: The liver contours are diffusely irregular consistent with cirrhosis. There are multiple siderotic nodules throughout the liver with diffuse suppression of hepatic signal on the in phase images consistent with hemosiderosis. There is a 1.6 cm lesion in the medial segment of the left hepatic lobe which demonstrates T1 shortening and low level enhancement following contrast. No other enhancing intrahepatic lesions are identified. There are several hepatic cysts, largest projecting from the dome of the right hepatic lobe, measuring 3.5 cm. As demonstrated on CT, there is a tubular mass within the porta hepatis which appears intra biliary. This demonstrates intermediate T2 signal, low-level enhancement and measures up to 3.8 cm transverse and 6.3 cm in length. There is resulting marked intrahepatic biliary dilatation with the left hepatic duct measuring up to 1.7 cm. The distal common bile duct is normal in caliber. Pancreas: Mildly atrophied. No evidence of pancreatic mass or pancreatic ductal dilatation. Spleen: Normal in size without focal abnormality. Adrenals/Urinary Tract: Both adrenal glands appear normal. Small renal cysts are noted. There is no suspicious renal finding or hydronephrosis. Stomach/Bowel: No evidence of bowel wall thickening, distention or surrounding inflammatory change. Vascular/Lymphatic: Multiple mildly enlarged lymph nodes within the porta hepatis are again noted. 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 Other: Ascites has decreased in volume. No suspicious peritoneal enhancement. Musculoskeletal: No acute or significant osseous findings. IMPRESSION: 1. 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 Tissue sampling and biliary decompression recommended. 2. Prominent  nonspecific lymph nodes in the porta hepatis, likely related to chronic liver disease. No definite distant metastases. 3. Macronodular cirrhosis with multiple siderotic nodules. There is one T1 hyperintense nodule in the left lobe which demonstrates low level enhancement and could reflect a dysplastic nodule. 4. Decreased volume of ascites following paracentesis. Electronically Signed   By: Richardean Sale M.D.   On: 09/15/2015 13:52   Ir Int Lianne Cure Biliary Drain With Cholangiogram  09/17/2015  CLINICAL DATA:  Biliary obstruction EXAM: IR INT-EXT BILIARY DRAIN W/ CHOLANGIOGRAM; IR BALLOON DILATION OF BILIARY DUCT/AMPULLA FLUOROSCOPY TIME:  9 minutes and 6 seconds MEDICATIONS AND MEDICAL HISTORY: Versed 4 mg, Fentanyl 100 mcg. Additional Medications: None.  Patient was on Rocephin. ANESTHESIA/SEDATION: Moderate sedation time: 60 minutes CONTRAST:  15 cc Omnipaque 300 PROCEDURE: The procedure, risks, benefits, and alternatives were explained to the patient.  Questions regarding the procedure were encouraged and answered. The patient understands and consents to the procedure. The upper abdominal region was prepped with Betadine in a sterile fashion, and a sterile drape was applied covering the operative field. A sterile gown and sterile gloves were used for the procedure. Under sonographic guidance, a Chiba needle was inserted into a left hepatic duct. Contrast was injected opacifying the biliary tree. The needle was removed over a 018 wire. The Accustick transitional dilator was advanced over the wire to the mid biliary tree. It could not be advanced into the central biliary tree secondary to a very resistant partial obstruction. An Amplatz wire was advanced through the transitional dilator. This was advanced into the central biliary tree. Again, the 6 [place] transitional dilator of the Accustick set could not be advanced across the Amplatz wire. A 4 French glide catheter was advanced across the Amplatz wire into the  central biliary tree. The glide catheter was advanced over a Bentson wire into the duodenum then was again exchange for the Amplatz wire. Initial attempts at dilating across the partial obstruction with serial dilators starting from 5 [place] were unsuccessful. A 5 French sheath was advanced over the Amplatz into the peripheral biliary tree a 4 French balloon was utilized to dilate the partial obstruction. The tract was then easily dilated to 8 [place] an 8 [place] biliary drain was then advanced over the Amplatz into the duodenum. Two additional sideholes were cut above the upper marker. It was looped and string fixed in the duodenum then sewn to the skin. The initial aspirate was sent for a cytology analysis. Contrast was injected. FINDINGS: Percutaneous cholangiography into the left the biliary tree demonstrates very limited opacification the the bile ducts. This was performed in an attempt to prevent sepsis. Final images demonstrate left internal external biliary drain placement with its tip coiled in the duodenum. Central left-sided ducts only were opacified by contrast. COMPLICATIONS: None IMPRESSION: Successful left internal external biliary drain placement. An 8 French device was placed secondary to a resistant biliary obstruction. Initial bile aspirate was sent for cytology. Electronically Signed   By: Marybelle Killings M.D.   On: 09/17/2015 08:33   Ir Exchange Biliary Drain  09/20/2015  CLINICAL DATA:  59 year old male with a history of hyperbilirubinemia and biliary obstruction. 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 The patient has undergone a left-sided percutaneous transhepatic cholangiogram and drainage performed 09/16/2015. He has not had normalization of his hyperbilirubinemia. He presents today for a through the tube cholangiogram and a biopsy EXAM: FLUOROSCOPIC GUIDED FINE NEEDLE ASPIRATION OF LIVER TUMOR/BILIARY TUMOR THROUGH THE TUBE CHOLANGIOGRAM  EXCHANGE OF INDWELLING LEFT-SIDED BILIARY INTERNAL EXTERNAL DRAIN FLUOROSCOPY TIME:  7 minutes 30 seconds MEDICATIONS AND MEDICAL HISTORY: None ANESTHESIA/SEDATION: 1.5 mg Versed, 75 mcg fentanyl 55 minutes CONTRAST:  67mL OMNIPAQUE IOHEXOL 300 MG/ML  SOLN COMPLICATIONS: None PROCEDURE: Informed consent was obtained from the patient following explanation of the procedure, risks, benefits and alternatives. The patient understands, agrees and consents for the procedure. All questions were addressed. A time out was performed. Maximal barrier sterile technique utilized including caps, mask, sterile gowns, sterile gloves, large sterile drape, hand hygiene, and 1% lidocaine was used for local anesthesia. The patient is positioned supine position on the IR table and the upper abdomen was prepped and draped in the usual sterile fashion. Scout images of the indwelling biliary tube were acquired and were used as a target for fine-needle aspiration  of the soft tissues in the adjacent liver and biliary system. Three separate 21 gauge fine-needle Chiba were used for fine-needle aspirations under fluoroscopic guidance. The samples were passed to a cytotechnologist in the room. Initial quick stain was negative, and 3 separate fine-needle aspirations were acquired under fluoroscopy. A Bentson wire was advanced through the indwelling tube into the duodenum, and the tube was removed. An 8 French sheath was advanced over the wire into the biliary system. The dilator was removed and a through the tube cholangiogram was performed with multiple obliquities. A new 10 French internal-external biliary tube was then placed. Final image was stored. Patient tolerated the procedure well and remained hemodynamically stable throughout. No complications were encountered and no significant blood loss was encountered. FINDINGS: Through the tube cholangiogram demonstrates opacification of the majority of the right-sided ductal system, which is in  communication with the left-sided ductal system. There is distortion of the ducts at the confluence of the left and the right sided ducts, with a few ducts of segment 6 not opacified, potentially obstructed by the tumor. Significant common bile duct and common hepatic duct dilation, with a tubular filling defect nearly entirely filling the common hepatic duct and common bile duct to the level of the ampulla. Final image demonstrates placement of a new 6 [place] internal external biliary tube terminating in the duodenum, with the most proximal side hole within the left-sided ductal system. 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 Tissue specimen sent to pathology for complete histopathologic analysis. Status post through the tube cholangiogram with confirmation of communication of the majority of the right-sided hepatic ducts with the left-sided ducts which remain drained. Status post exchange of internal/ external biliary drain with placement of a new 10 French drain terminating in the duodenum. Signed, Dulcy Fanny. Earleen Newport, DO [facility] Electronically Signed   By: Corrie Mckusick D.O.   On: 09/20/2015 14:21   Ir Balloon Dilation Of Biliary Ducts/ampulla  09/17/2015  CLINICAL DATA:  Biliary obstruction EXAM: IR INT-EXT BILIARY DRAIN W/ CHOLANGIOGRAM; IR BALLOON DILATION OF BILIARY DUCT/AMPULLA FLUOROSCOPY TIME:  9 minutes and 6 seconds MEDICATIONS AND MEDICAL HISTORY: Versed 4 mg, Fentanyl 100 mcg. Additional Medications: None.  Patient was on Rocephin. ANESTHESIA/SEDATION: Moderate sedation time: 60 minutes CONTRAST:  15 cc Omnipaque 300 PROCEDURE: The procedure, risks, benefits, and alternatives were explained to the patient. Questions regarding the procedure were encouraged and answered. The patient understands and consents to the procedure. The upper abdominal region was prepped  with Betadine in a sterile fashion, and a sterile drape was applied covering the operative field. A sterile gown and sterile gloves were used for the procedure. Under sonographic guidance, a Chiba needle was inserted into a left hepatic duct. Contrast was injected opacifying the biliary tree. The needle was removed over a 018 wire. The Accustick transitional dilator was advanced over the wire to the mid biliary tree. It could not be advanced into the central biliary tree secondary to a very resistant partial obstruction. An Amplatz wire was advanced through the transitional dilator. This was advanced into the central biliary tree. Again, the 6 [place] transitional dilator of the Accustick set could not be advanced across the Amplatz wire. A 4 French glide catheter was advanced across the Amplatz wire into the central biliary tree. The glide catheter was advanced over a Bentson wire into the duodenum then was again exchange for the Amplatz wire.  Initial attempts at dilating across the partial obstruction with serial dilators starting from 5 [place] were unsuccessful. A 5 French sheath was advanced over the Amplatz into the peripheral biliary tree a 4 French balloon was utilized to dilate the partial obstruction. The tract was then easily dilated to 8 [place] an 8 [place] biliary drain was then advanced over the Amplatz into the duodenum. Two additional sideholes were cut above the upper marker. It was looped and string fixed in the duodenum then sewn to the skin. The initial aspirate was sent for a cytology analysis. Contrast was injected. FINDINGS: Percutaneous cholangiography into the left the biliary tree demonstrates very limited opacification the the bile ducts. This was performed in an attempt to prevent sepsis. Final images demonstrate left internal external biliary drain placement with its tip coiled in the duodenum. Central left-sided ducts only were opacified by contrast. COMPLICATIONS: None IMPRESSION:  Successful left internal external biliary drain placement. An 8 French device was placed secondary to a resistant biliary obstruction. Initial bile aspirate was sent for cytology. Electronically Signed   By: Marybelle Killings M.D.   On: 09/17/2015 08:33    CBC  Recent Labs Lab 09/17/15 0418 09/18/15 0400 09/19/15 0355 09/20/15 0530 09/22/15 0415  WBC 7.0 13.4* 19.6* 17.8* 12.5*  HGB 13.4 14.8 14.3 14.1 13.3  HCT 37.6* 40.9 40.4 39.7 38.0*  PLT 119* 133* 125* 139* 135*  MCV 103.3* 102.8* 103.3* 102.6* 103.0*  MCH 36.8* 37.2* 36.6* 36.4* 36.0*  MCHC 35.6 36.2* 35.4 35.5 35.0  RDW 14.2 14.1 14.1 13.9 13.6  LYMPHSABS  --   --   --   --  1.4  MONOABS  --   --   --   --  1.9*  EOSABS  --   --   --   --  0.3  BASOSABS  --   --   --   --  0.1    Chemistries   Recent Labs Lab 09/17/15 0418 09/18/15 0400 09/19/15 0355 09/20/15 0530 09/22/15 0415  NA 132* 132* 128* 125* 127*  K 4.1 4.1 4.0 4.4 4.3  CL 99* 92* 89* 88* 87*  CO2 28 32 33* 30 34*  GLUCOSE 105* 107* 116* 82 98  BUN $Re'16 19 19 'voR$ 31* 29*  CREATININE 0.68 0.80 0.75 1.13 0.90  CALCIUM 9.0 9.4 9.0 8.8* 9.0  MG  --  1.9  --   --   --   AST 91* 72* 51* 53* 64*  ALT 46 43 34 31 34  ALKPHOS 170* 171* 148* 129* 113  BILITOT 3.6* 2.8* 3.0* 3.8* 3.7*   ------------------------------------------------------------------------------------------------------------------ estimated creatinine clearance is 106.7 mL/min (by C-G formula based on Cr of 0.9). ------------------------------------------------------------------------------------------------------------------ No results for input(s): HGBA1C in the last 72 hours. ------------------------------------------------------------------------------------------------------------------ No results for input(s): CHOL, HDL, LDLCALC, TRIG, CHOLHDL, LDLDIRECT in the last 72  hours. ------------------------------------------------------------------------------------------------------------------ No results for input(s): TSH, T4TOTAL, T3FREE, THYROIDAB in the last 72 hours.  Invalid input(s): FREET3 ------------------------------------------------------------------------------------------------------------------ No results for input(s): VITAMINB12, FOLATE, FERRITIN, TIBC, IRON, RETICCTPCT in the last 72 hours.  Coagulation profile  Recent Labs Lab 09/18/15 0400 09/19/15 0355 09/20/15 0530 09/22/15 0415  INR 1.48 1.68* 1.64* 1.64*    No results for input(s): DDIMER in the last 72 hours.  Cardiac Enzymes No results for input(s): CKMB, TROPONINI, MYOGLOBIN in the last 168 hours.  Invalid input(s): CK ------------------------------------------------------------------------------------------------------------------ Invalid input(s): POCBNP    Verneita Griffes, MD [facility] [phone]

## 2015-09-23 ENCOUNTER — Other Ambulatory Visit: Payer: Self-pay | Admitting: Radiology

## 2015-09-23 DIAGNOSIS — C240 Malignant neoplasm of extrahepatic bile duct: Secondary | ICD-10-CM

## 2015-09-23 LAB — COMPREHENSIVE METABOLIC PANEL
ALBUMIN: 2 g/dL — AB (ref 3.5–5.0)
ALK PHOS: 112 U/L (ref 38–126)
ALT: 35 U/L (ref 17–63)
AST: 67 U/L — AB (ref 15–41)
Anion gap: 7 (ref 5–15)
BILIRUBIN TOTAL: 3.6 mg/dL — AB (ref 0.3–1.2)
BUN: 25 mg/dL — AB (ref 6–20)
CALCIUM: 8.7 mg/dL — AB (ref 8.9–10.3)
CO2: 30 mmol/L (ref 22–32)
CREATININE: 0.97 mg/dL (ref 0.61–1.24)
Chloride: 84 mmol/L — ABNORMAL LOW (ref 101–111)
GFR calc Af Amer: >60 mL/min (ref >60)
GLUCOSE: 120 mg/dL — AB (ref 65–99)
POTASSIUM: 4.2 mmol/L (ref 3.5–5.1)
Sodium: 121 mmol/L — ABNORMAL LOW (ref 135–145)
TOTAL PROTEIN: 7.1 g/dL (ref 6.5–8.1)

## 2015-09-23 LAB — HEPATIC FUNCTION PANEL
ALT: 34 U/L (ref 17–63)
AST: 68 U/L — AB (ref 15–41)
Albumin: 2.1 g/dL — ABNORMAL LOW (ref 3.5–5.0)
Alkaline Phosphatase: 119 U/L (ref 38–126)
BILIRUBIN INDIRECT: 1.8 mg/dL — AB (ref 0.3–0.9)
Bilirubin, Direct: 1.7 mg/dL — ABNORMAL HIGH (ref 0.1–0.5)
TOTAL PROTEIN: 7.2 g/dL (ref 6.5–8.1)
Total Bilirubin: 3.5 mg/dL — ABNORMAL HIGH (ref 0.3–1.2)

## 2015-09-23 LAB — CBC WITH DIFFERENTIAL/PLATELET
Basophils Absolute: 0.1 K/uL (ref 0.0–0.1)
Basophils Relative: 1 %
Eosinophils Absolute: 0.2 K/uL (ref 0.0–0.7)
Eosinophils Relative: 2 %
HCT: 39.2 % (ref 39.0–52.0)
Hemoglobin: 13.9 g/dL (ref 13.0–17.0)
Lymphocytes Relative: 13 %
Lymphs Abs: 1.6 K/uL (ref 0.7–4.0)
MCH: 36.4 pg — ABNORMAL HIGH (ref 26.0–34.0)
MCHC: 35.5 g/dL (ref 30.0–36.0)
MCV: 102.6 fL — ABNORMAL HIGH (ref 78.0–100.0)
Monocytes Absolute: 1.6 K/uL — ABNORMAL HIGH (ref 0.1–1.0)
Monocytes Relative: 13 %
Neutro Abs: 8.7 K/uL — ABNORMAL HIGH (ref 1.7–7.7)
Neutrophils Relative %: 71 %
Platelets: 149 K/uL — ABNORMAL LOW (ref 150–400)
RBC: 3.82 MIL/uL — ABNORMAL LOW (ref 4.22–5.81)
RDW: 13.6 % (ref 11.5–15.5)
WBC: 12.2 K/uL — ABNORMAL HIGH (ref 4.0–10.5)

## 2015-09-23 LAB — PROTIME-INR
INR: 1.67 — ABNORMAL HIGH (ref 0.00–1.49)
PROTHROMBIN TIME: 19.7 s — AB (ref 11.6–15.2)

## 2015-09-23 MED ORDER — OXYCODONE HCL ER 10 MG PO T12A
10.0000 mg | EXTENDED_RELEASE_TABLET | Freq: Two times a day (BID) | ORAL | Status: DC
Start: 2015-09-23 — End: 2015-10-01

## 2015-09-23 MED ORDER — FLUCONAZOLE 200 MG PO TABS: 200.0000 mg | ORAL_TABLET | Freq: Every day | ORAL | Status: AC

## 2015-09-23 MED ORDER — AMOXICILLIN-POT CLAVULANATE 875-125 MG PO TABS: 1.0000 | ORAL_TABLET | Freq: Two times a day (BID) | ORAL | Status: DC

## 2015-09-23 MED ORDER — SODIUM CHLORIDE 1 G PO TABS: 1.0000 g | ORAL_TABLET | Freq: Two times a day (BID) | ORAL | Status: AC

## 2015-09-23 NOTE — Progress Notes (Signed)
MD orders for HHRN/Aide at DC. Pt declines [facility] services at this time. Bedside RN to do teaching with drain care for pt and [facility] # placed on pt's AVS if he has any difficulty at home with drain. Marney Doctor RN,BSN,NCM [phone]

## 2015-09-23 NOTE — Discharge Summary (Signed)
Physician Discharge Summary  Eric Schroeder EVO:[id number] DOB: Feb 03, 1956 DOA: 09/14/2015  PCP: No primary care provider on file.  Admit date: 09/14/2015 Discharge date: 09/23/2015  Time spent: 35 minutes  Recommendations for Outpatient Follow-up:  1. Will need close coordination between oncology/gi/IR 2. cmet and cbc 1 week 3. drain teaching as OP with RN as home health needed 4. Needs Augmentin ending 12/17 for intrabdominal coverage as drainage fluid grew multiple organsim 5. Continue Diflucan through 12/21 as above   Discharge Diagnoses:  Principal Problem:   Liver mass Active Problems:   Ascites   UTI (lower urinary tract infection)   Biliary obstruction due to malignant neoplasm   Hepatic cirrhosis [facility])   Discharge Condition: fair  Diet recommendation: liberalize  Filed Weights   09/20/15 0526 09/23/15 2993  Weight: 80.8 kg (178 lb 2.1 oz) 76.885 kg (169 lb 8 oz)    History of present illness:  59 y.o. male with no significant past medical history who comes to the emergency department with complaints of abdominal distention, abdominal pain, bilateral flank pain, lower extremity edema for about 2 weeks. This was preceded by progressively worse bloating, early satiety for most of this year. He also endorses a 35 pound weight loss during this timeframe as well. He states that he adjusted his eating habits to this, Found to have liver mass. IR placed drain. 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 PBC -Hepatitis A antibody +, otherwise hepatitis panel unremarkable -Cytology showed no malignant cells -Oncology consulted and appreciated  -S/p PTC by IR on 12/5 (output >3L).  -still high drain output ~ 1.00 liters - patient had exchange of previous 8 [place] biliary drain with 10 [place] drain 12/9 and brushings were performed at that time as well - await preliminary pathology findings and gradually diet - discussed with patient -needs very close follow-up with interventional, GI and oncology as an outpatient  -prelim path showed Adenocarcinoma.  Needs follow up as OP   Elevated bilirubin, AST, INR -Likely secondary to the above -Patient given FFP earlier this admit -Continue to monitor CMP  Hyponatremia 2/2 to ongoing biliary losses Has been on d5 so switched to NS IV saline 75 cc/hr sodium is currently improved from 122-125->127 Will Rx salt tablets also on d/c home -he is mentating fine  Urinary tract infection -UA showed 0-5 WBC, positive nitrites leukocytes, few bacteria -Patient was started on ceftriaxone 12/3 -Urine culture shows no growth to date (was not collected upon admission before antibiotics started)  leukocytosis  initial Gram stain 12/10 shows yeast as well as gram-positive cocci Await speciation as well as sensitivities -Fluid culture shows some yeast so Fluconazole started along with Ceftriaxone  fluid cultures non-suggestive of bacterial pathogen-narrowed after discussion 12/11 with ID to Augmentin and Diflucan to complete a 2 weeks course-dates as noted above ceftriaxone changed on 12/8 2 vancomycin and Zosyn for intra-abdominal coverage given white count 17-19 range and gram-positive cocci in chains noted Rpt labs in am to monitor for resolution  Discharge Exam: Filed Vitals:   09/22/15 2115 09/23/15 0621  BP: 118/71 104/72   Pulse: 90 114  Temp: 98.5 F (36.9 C) 98.1 F (36.7 C)  Resp: 16 17    General: eomi ncat Cardiovascular: s1 s2 no m/r/g Respiratory: clear no added sound No le edema   Discharge Instructions   Discharge Instructions    Diet - low sodium heart healthy    Complete by:  As directed      Discharge instructions    Complete by:  As directed   Complete course of Augmentin until 12/17 Complete Fluconazole 12/21 Interventional radiology will call you and scheudle a 2 weeks follow up I will Cc Dr. Ardis Hughs of GI to co-ordinate an op visit I will forward note to Oncology and let Dr. Burr Medico be aware of need for close follow up Good luck     Increase activity slowly    Complete by:  As directed           Current Discharge Medication List    START taking these medications   Details  amoxicillin-clavulanate (AUGMENTIN) 875-125 MG tablet Take 1 tablet by mouth every 12 (twelve) hours. Qty: 10 tablet, Refills: 0    fluconazole (DIFLUCAN) 200 MG tablet Take 1 tablet (200 mg total) by mouth daily. Qty: 9 tablet, Refills: 0    oxyCODONE (OXYCONTIN) 10 mg 12 hr tablet Take 1 tablet (10 mg total) by mouth every 12 (twelve) hours. Qty: 40 tablet, Refills: 0    sodium chloride 1 G tablet Take 1 tablet (1 g total) by mouth 2 (two) times daily with a meal. Qty: 40 tablet, Refills: 0      CONTINUE these medications which have NOT CHANGED   Details  Acetaminophen (TYLENOL PO) Take 2 tablets by mouth every 4 (four) hours as needed (pain).       No Known Allergies    The results of significant diagnostics from this hospitalization (including imaging, microbiology, ancillary and laboratory) are listed below for reference.    Significant Diagnostic Studies: Dg Chest 2 View  09/14/2015  CLINICAL DATA:  Bilateral ankle swelling. Abdominal distention. Back pain. Shallow breathing. EXAM: CHEST  2 VIEW COMPARISON:  None. FINDINGS: Poor inspiration. Normal sized heart. Minimal left lateral  pleural thickening. No pleural fluid is seen posteriorly on either side. Small amount of linear density at the left lung base. Mild thoracic spine degenerative changes. IMPRESSION: Poor inspiration with a small amount of pleural and parenchymal scarring at the left lung base. Electronically Signed   By: Claudie Revering M.D.   On: 09/14/2015 19:20   Ct Chest W Contrast  09/15/2015  CLINICAL DATA:  Liver mass diagnosed on CT 1 day prior. Status post paracentesis. Fatigue. Inpatient. EXAM: CT CHEST WITH CONTRAST TECHNIQUE: Multidetector CT imaging of the chest was performed during intravenous contrast administration. CONTRAST:  57m OMNIPAQUE IOHEXOL 300 MG/ML  SOLN COMPARISON:  09/14/2015 CT abdomen/ pelvis. Chest radiograph from 1 day prior. FINDINGS: Mediastinum/Nodes: Normal heart size. No pericardial fluid/thickening. Left anterior descending and left circumflex coronary atherosclerosis. Great vessels are normal in course and caliber. No central pulmonary emboli. Normal visualized thyroid. Large lower esophageal varices. No pathologically enlarged axillary, mediastinal or hilar lymph nodes. Lungs/Pleura: No pneumothorax. Trace left pleural effusion. Subsegmental atelectasis in both lower lobes. No acute consolidative airspace disease, significant pulmonary nodules or lung masses. Upper abdomen: Re- demonstrated is cirrhosis and diffuse severe intrahepatic biliary ductal dilatation, with re- demonstration of central liver 4.7 cm mass extending  superiorly and inferiorly along the central bile ducts. There are stable subcentimeter hypodense lesions in the left liver lobe and simple cysts in the right liver lobe. Small volume upper abdominal ascites, decreased. Fat stranding throughout the upper abdominal mesenteric and omental fat, unchanged. Stable dilated visualized proximal common bile duct. Stable mild upper retroperitoneal adenopathy. Musculoskeletal: Stable nondisplaced healing subacute lateral left eighth rib  fracture. Mild-to-moderate degenerative changes in thoracic spine. IMPRESSION: 1. No evidence of metastatic disease in the chest. 2. Trace left pleural effusion. Mild bibasilar atelectasis. No acute consolidative airspace disease. 3. Three-vessel coronary atherosclerosis. 4. Stable nondisplaced healing subacute lateral left eighth rib fracture. 5. Re- demonstration of cirrhosis, central liver mass, severe diffuse intrahepatic biliary ductal dilatation, indeterminate subcentimeter hypodense left liver lobe lesions. Small volume upper abdominal ascites, decreased. Electronically Signed   By: Ilona Sorrel M.D.   On: 09/15/2015 13:47   Ct Abdomen Pelvis W Contrast  09/14/2015  CLINICAL DATA:  Abdominal distention. Back pain. Lower extremity swelling. EXAM: CT ABDOMEN AND PELVIS WITH CONTRAST TECHNIQUE: Multidetector CT imaging of the abdomen and pelvis was performed using the standard protocol following bolus administration of intravenous contrast. CONTRAST:  148m OMNIPAQUE IOHEXOL 300 MG/ML  SOLN COMPARISON:  None. FINDINGS: Lower chest: No significant pulmonary nodules or acute consolidative airspace disease. Subsegmental atelectasis in the basilar lower lobes. Hepatobiliary: The liver surface is diffusely prominently irregular, and there is relative hypertrophy of the left liver lobe, in keeping with cirrhosis. There is severe diffuse intrahepatic biliary ductal dilatation. There is a lobulated 4.8 x 3.5 cm mass in the porta hepatis (series 2/ image 25), which demonstrates hypoenhancement relative to the liver parenchyma, and which demonstrates finger-like extensions into the right liver lobe along the biliary tree. 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 Simple appearing 1.9 cm liver cyst in the posterior right liver lobe. Separate simple appearing 3.4 cm liver cyst in the posterior upper right liver lobe. There are at least 2 additional subcentimeter hypodense lesions in the  left liver lobe, too small to characterize. Nondistended gallbladder with nonspecific mild diffuse gallbladder wall thickening. The common bile duct is dilated up to 14 mm in diameter proximally with smooth distal tapering. Pancreas: Normal, with no mass or duct dilation. Spleen: Normal size. No mass. Adrenals/Urinary Tract: Normal adrenals. Normal kidneys with no hydronephrosis and no renal mass. Normal bladder. Stomach/Bowel: Grossly normal stomach. Normal caliber small bowel with no small bowel wall thickening. Normal appendix. Normal large bowel with no diverticulosis, large bowel wall thickening or pericolonic fat stranding. Vascular/Lymphatic: Normal caliber abdominal aorta. Patent portal, splenic and renal veins. There are large esophageal varices. There are small paraumbilical varices. There are additional portosystemic varices in the left omentum, which appear to drain into the left pelvic deep veins. There are multiple mildly enlarged porta hepatis nodes, largest 1.4 cm (series 2/ image 30). Reproductive: Normal size prostate with nonspecific internal prostatic calcifications. Other: No pneumoperitoneum. Moderate to large volume ascites, which measures simple fluid density. Nonspecific fat stranding throughout the greater omentum and mesentery. Musculoskeletal: No aggressive appearing focal osseous lesions. Subacute healing lateral left eighth rib fracture. Mild-to-moderate degenerative changes in the visualized thoracolumbar spine. IMPRESSION: 1. Cirrhosis. Lobulated 4.8 x 3.5 cm solid mass in the porta hepatis with finger-like extensions into the right liver lobe along the biliary tree. Severe diffuse intrahepatic biliary ductal dilatation. 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 MRI abdomen with and  without intravenous contrast could be performed after paracentesis for further evaluation. Recommend  GI consultation for ERCP and possible biliary decompression, which may require IR consultation for percutaneous biliary decompression. 2. Large volume ascites. 3. Large esophageal varices. Small paraumbilical varices. Additional portosystemic left omental varices. 4. Nonspecific mild porta hepatis lymphadenopathy. 5. Separate subcentimeter hypodense liver lesions are too small to characterize and could represent liver metastases. 6. Subacute lateral left eighth rib fracture. These results were called by telephone at the time of interpretation on 09/14/2015 at 7:38 pm to Dr. Lonia Skinner , who verbally acknowledged these results. Electronically Signed   By: Ilona Sorrel M.D.   On: 09/14/2015 19:40   Mr 3d Recon At Scanner  09/15/2015  CLINICAL DATA:  Cirrhosis with ascites, distal esophageal varices and lobulated mass in the porta hepatis causing biliary obstruction on CT. EXAM: MRI ABDOMEN WITHOUT AND WITH CONTRAST (INCLUDING MRCP) TECHNIQUE: Multiplanar multisequence MR imaging of the abdomen was performed both before and after the administration of intravenous contrast. Heavily T2-weighted images of the biliary and pancreatic ducts were obtained, and three-dimensional MRCP images were rendered by post processing. CONTRAST:  24m MULTIHANCE GADOBENATE DIMEGLUMINE 529 MG/ML IV SOLN COMPARISON:  Abdominal CT 09/14/2015. FINDINGS: Unfortunately, the study is mildly motion degraded due to the patient's inability to completely suspend respiration. Lower chest:  Small left-greater-than-right pleural effusions. Hepatobiliary: The liver contours are diffusely irregular consistent with cirrhosis. There are multiple siderotic nodules throughout the liver with diffuse suppression of hepatic signal on the in phase images consistent with hemosiderosis. There is a 1.6 cm lesion in the medial segment of the left hepatic lobe which demonstrates T1 shortening and low level enhancement following contrast. No other enhancing  intrahepatic lesions are identified. There are several hepatic cysts, largest projecting from the dome of the right hepatic lobe, measuring 3.5 cm. As demonstrated on CT, there is a tubular mass within the porta hepatis which appears intra biliary. This demonstrates intermediate T2 signal, low-level enhancement and measures up to 3.8 cm transverse and 6.3 cm in length. There is resulting marked intrahepatic biliary dilatation with the left hepatic duct measuring up to 1.7 cm. The distal common bile duct is normal in caliber. Pancreas: Mildly atrophied. No evidence of pancreatic mass or pancreatic ductal dilatation. Spleen: Normal in size without focal abnormality. Adrenals/Urinary Tract: Both adrenal glands appear normal. Small renal cysts are noted. There is no suspicious renal finding or hydronephrosis. Stomach/Bowel: No evidence of bowel wall thickening, distention or surrounding inflammatory change. Vascular/Lymphatic: Multiple mildly enlarged lymph nodes within the porta hepatis are again noted. 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 Other: Ascites has decreased in volume. No suspicious peritoneal enhancement. Musculoskeletal: No acute or significant osseous findings. IMPRESSION: 1. 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 Tissue sampling and biliary decompression recommended. 2. Prominent nonspecific lymph nodes in the porta hepatis, likely related to chronic liver disease. No definite distant metastases. 3. Macronodular cirrhosis with multiple siderotic nodules. There is one T1 hyperintense nodule in the left lobe which demonstrates low level enhancement and could reflect a dysplastic nodule. 4. Decreased volume of ascites following paracentesis. Electronically Signed   By: WRichardean SaleM.D.   On: 09/15/2015 13:52   [place] Paracentesis  09/15/2015  WArdis Rowan PA-C     09/15/2015 11:37 AM Successful [place] paracentesis from LLQ. Yielded 6 liters of clear yellow fluid.  No immediate complications. Pt tolerated well. Specimen was sent for labs. WENDY S BLAIR PA-C 09/15/2015 11:37 AM   Ir Fluoro Guide Ndl Plmt / Bx  09/20/2015  CLINICAL DATA:  59 year old male with a history of hyperbilirubinemia and biliary obstruction. 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 The patient has undergone a left-sided percutaneous transhepatic cholangiogram and drainage performed 09/16/2015. He has not had normalization of his hyperbilirubinemia. He presents today for a through the tube cholangiogram and a biopsy EXAM: FLUOROSCOPIC GUIDED FINE NEEDLE ASPIRATION OF LIVER TUMOR/BILIARY TUMOR THROUGH THE TUBE CHOLANGIOGRAM EXCHANGE OF INDWELLING LEFT-SIDED BILIARY INTERNAL EXTERNAL DRAIN FLUOROSCOPY TIME:  7 minutes 30 seconds MEDICATIONS AND MEDICAL HISTORY: None ANESTHESIA/SEDATION: 1.5 mg Versed, 75 mcg fentanyl 55 minutes CONTRAST:  3m OMNIPAQUE IOHEXOL 300 MG/ML  SOLN COMPLICATIONS: None PROCEDURE: Informed consent was obtained from the patient following explanation of the procedure, risks, benefits and alternatives. The patient understands, agrees and consents for the procedure. All questions were addressed. A time out was performed. Maximal barrier sterile technique utilized including caps, mask, sterile gowns, sterile gloves, large sterile drape, hand hygiene, and 1% lidocaine was used for local anesthesia. The patient is positioned supine position on the IR table and the upper abdomen was prepped and draped in the usual sterile fashion. Scout images of the indwelling biliary tube were acquired and were used as a target for fine-needle aspiration of the soft tissues in the adjacent liver and biliary system. Three separate 21 gauge fine-needle Chiba were used for fine-needle aspirations under  fluoroscopic guidance. The samples were passed to a cytotechnologist in the room. Initial quick stain was negative, and 3 separate fine-needle aspirations were acquired under fluoroscopy. A Bentson wire was advanced through the indwelling tube into the duodenum, and the tube was removed. An 8 French sheath was advanced over the wire into the biliary system. The dilator was removed and a through the tube cholangiogram was performed with multiple obliquities. A new 10 French internal-external biliary tube was then placed. Final image was stored. Patient tolerated the procedure well and remained hemodynamically stable throughout. No complications were encountered and no significant blood loss was encountered. FINDINGS: Through the tube cholangiogram demonstrates opacification of the majority of the right-sided ductal system, which is in communication with the left-sided ductal system. There is distortion of the ducts at the confluence of the left and the right sided ducts, with a few ducts of segment 6 not opacified, potentially obstructed by the tumor. Significant common bile duct and common hepatic duct dilation, with a tubular filling defect nearly entirely filling the common hepatic duct and common bile duct to the level of the ampulla. Final image demonstrates placement of a new [place] external biliary tube terminating in the duodenum, with the most proximal side hole within the left-sided ductal system. 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 Tissue specimen sent to pathology for complete histopathologic analysis. Status post through the tube cholangiogram with confirmation of communication of the majority of the right-sided hepatic ducts with the left-sided ducts which remain drained. Status post exchange of internal/ external biliary drain with placement of a new 10 French drain terminating in the duodenum. Signed, JDulcy Fanny WEarleen Newport DO [facility] Electronically Signed   By: JCorrie MckusickD.O.   On: 09/20/2015 14:21   Mr Abd W/wo Cm/mrcp  09/15/2015  CLINICAL DATA:  Cirrhosis with ascites, distal esophageal varices and lobulated mass in the porta hepatis causing biliary obstruction on CT. EXAM: MRI ABDOMEN WITHOUT AND WITH CONTRAST (INCLUDING MRCP) TECHNIQUE: Multiplanar multisequence MR imaging of the abdomen was performed both before and after the administration of intravenous contrast. Heavily T2-weighted images of the biliary and pancreatic ducts were obtained, and three-dimensional MRCP images were rendered by post processing. CONTRAST:  27m MULTIHANCE GADOBENATE DIMEGLUMINE 529 MG/ML IV SOLN COMPARISON:  Abdominal CT 09/14/2015. FINDINGS: Unfortunately, the study is mildly motion degraded due to the patient's inability to completely suspend respiration. Lower chest:  Small left-greater-than-right pleural effusions. Hepatobiliary: The liver contours are diffusely irregular consistent with cirrhosis. There are multiple siderotic nodules throughout the liver with diffuse suppression of hepatic signal on the in phase images consistent with hemosiderosis. There is a 1.6 cm lesion in the medial segment of the left hepatic lobe which demonstrates T1 shortening and low level enhancement following contrast. No other enhancing intrahepatic lesions are identified. There are several hepatic cysts, largest projecting from the dome of the right hepatic lobe, measuring 3.5 cm. As demonstrated on CT, there is a tubular mass within the porta hepatis which appears intra biliary. This demonstrates intermediate T2 signal, low-level enhancement and measures up to 3.8 cm transverse and 6.3 cm in length. There is resulting marked intrahepatic biliary dilatation with the left hepatic duct measuring up to 1.7 cm. The distal common bile duct is normal in caliber. Pancreas: Mildly atrophied.  No evidence of pancreatic mass or pancreatic ductal dilatation. Spleen: Normal in size without focal abnormality. Adrenals/Urinary Tract: Both adrenal glands appear normal. Small renal cysts are noted. There is no suspicious renal finding or hydronephrosis. Stomach/Bowel: No evidence of bowel wall thickening, distention or surrounding inflammatory change. Vascular/Lymphatic: Multiple mildly enlarged lymph nodes within the porta hepatis are again noted. 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 Other: Ascites has decreased in volume. No suspicious peritoneal enhancement. Musculoskeletal: No acute or significant osseous findings. IMPRESSION: 1. 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 Tissue sampling and biliary decompression recommended. 2. Prominent nonspecific lymph nodes in the porta hepatis, likely related to chronic liver disease. No definite distant metastases. 3. Macronodular cirrhosis with multiple siderotic nodules. There is one T1 hyperintense nodule in the left lobe which demonstrates low level enhancement and could reflect a dysplastic nodule. 4. Decreased volume of ascites following paracentesis. Electronically Signed   By: WRichardean SaleM.D.   On: 09/15/2015 13:52   Ir Int ELianne CureBiliary Drain With Cholangiogram  09/17/2015  CLINICAL DATA:  Biliary obstruction EXAM: IR INT-EXT BILIARY DRAIN W/ CHOLANGIOGRAM; IR BALLOON DILATION OF BILIARY DUCT/AMPULLA FLUOROSCOPY TIME:  9 minutes and 6 seconds MEDICATIONS AND MEDICAL HISTORY: Versed 4 mg, Fentanyl 100 mcg. Additional Medications: None.  Patient was on Rocephin. ANESTHESIA/SEDATION: Moderate sedation time: 60 minutes CONTRAST:  15 cc Omnipaque 300 PROCEDURE: The procedure, risks, benefits, and alternatives were explained to the patient. Questions regarding the procedure were encouraged  and answered. The patient understands and consents to the procedure. The upper abdominal region was prepped with Betadine in a sterile fashion, and a sterile drape was applied covering the operative field. A sterile gown and sterile gloves were used for the procedure. Under sonographic guidance, a Chiba needle was inserted into a left hepatic duct. Contrast was injected opacifying the biliary tree. The needle was removed over a 018 wire. The Accustick  transitional dilator was advanced over the wire to the mid biliary tree. It could not be advanced into the central biliary tree secondary to a very resistant partial obstruction. An Amplatz wire was advanced through the transitional dilator. This was advanced into the central biliary tree. Again, the 6 [place] transitional dilator of the Accustick set could not be advanced across the Amplatz wire. A 4 French glide catheter was advanced across the Amplatz wire into the central biliary tree. The glide catheter was advanced over a Bentson wire into the duodenum then was again exchange for the Amplatz wire. Initial attempts at dilating across the partial obstruction with serial dilators starting from 5 [place] were unsuccessful. A 5 French sheath was advanced over the Amplatz into the peripheral biliary tree a 4 French balloon was utilized to dilate the partial obstruction. The tract was then easily dilated to 8 [place] an 8 [place] biliary drain was then advanced over the Amplatz into the duodenum. Two additional sideholes were cut above the upper marker. It was looped and string fixed in the duodenum then sewn to the skin. The initial aspirate was sent for a cytology analysis. Contrast was injected. FINDINGS: Percutaneous cholangiography into the left the biliary tree demonstrates very limited opacification the the bile ducts. This was performed in an attempt to prevent sepsis. Final images demonstrate left internal external biliary drain placement with its tip coiled in the  duodenum. Central left-sided ducts only were opacified by contrast. COMPLICATIONS: None IMPRESSION: Successful left internal external biliary drain placement. An 8 French device was placed secondary to a resistant biliary obstruction. Initial bile aspirate was sent for cytology. Electronically Signed   By: Marybelle Killings M.D.   On: 09/17/2015 08:33   Ir Exchange Biliary Drain  09/20/2015  CLINICAL DATA:  59 year old male with a history of hyperbilirubinemia and biliary obstruction. 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 The patient has undergone a left-sided percutaneous transhepatic cholangiogram and drainage performed 09/16/2015. He has not had normalization of his hyperbilirubinemia. He presents today for a through the tube cholangiogram and a biopsy EXAM: FLUOROSCOPIC GUIDED FINE NEEDLE ASPIRATION OF LIVER TUMOR/BILIARY TUMOR THROUGH THE TUBE CHOLANGIOGRAM EXCHANGE OF INDWELLING LEFT-SIDED BILIARY INTERNAL EXTERNAL DRAIN FLUOROSCOPY TIME:  7 minutes 30 seconds MEDICATIONS AND MEDICAL HISTORY: None ANESTHESIA/SEDATION: 1.5 mg Versed, 75 mcg fentanyl 55 minutes CONTRAST:  24m OMNIPAQUE IOHEXOL 300 MG/ML  SOLN COMPLICATIONS: None PROCEDURE: Informed consent was obtained from the patient following explanation of the procedure, risks, benefits and alternatives. The patient understands, agrees and consents for the procedure. All questions were addressed. A time out was performed. Maximal barrier sterile technique utilized including caps, mask, sterile gowns, sterile gloves, large sterile drape, hand hygiene, and 1% lidocaine was used for local anesthesia. The patient is positioned supine position on the IR table and the upper abdomen was prepped and draped in the usual sterile fashion. Scout images of the indwelling biliary tube were acquired and were used as a target for fine-needle aspiration of the soft tissues in the adjacent liver and biliary system. Three separate  21 gauge fine-needle Chiba were used for fine-needle aspirations under fluoroscopic guidance. The samples were passed to a cytotechnologist in the room. Initial quick stain was negative, and 3 separate fine-needle aspirations were acquired under fluoroscopy. A Bentson wire was advanced through the indwelling tube into the duodenum, and the tube was removed. An 8 French sheath was advanced over the wire into the biliary system.  The dilator was removed and a through the tube cholangiogram was performed with multiple obliquities. A new 10 French internal-external biliary tube was then placed. Final image was stored. Patient tolerated the procedure well and remained hemodynamically stable throughout. No complications were encountered and no significant blood loss was encountered. FINDINGS: Through the tube cholangiogram demonstrates opacification of the majority of the right-sided ductal system, which is in communication with the left-sided ductal system. There is distortion of the ducts at the confluence of the left and the right sided ducts, with a few ducts of segment 6 not opacified, potentially obstructed by the tumor. Significant common bile duct and common hepatic duct dilation, with a tubular filling defect nearly entirely filling the common hepatic duct and common bile duct to the level of the ampulla. Final image demonstrates placement of a new 59 [place] internal external biliary tube terminating in the duodenum, with the most proximal side hole within the left-sided ductal system. 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 Tissue specimen sent to pathology for complete histopathologic analysis. Status post through the tube cholangiogram with confirmation of communication of the majority of the right-sided hepatic ducts with the left-sided ducts which remain drained. Status post exchange of internal/ external biliary drain with  placement of a new 10 French drain terminating in the duodenum. Signed, Dulcy Fanny. Earleen Newport, DO [facility] Electronically Signed   By: Corrie Mckusick D.O.   On: 09/20/2015 14:21   Ir Balloon Dilation Of Biliary Ducts/ampulla  09/17/2015  CLINICAL DATA:  Biliary obstruction EXAM: IR INT-EXT BILIARY DRAIN W/ CHOLANGIOGRAM; IR BALLOON DILATION OF BILIARY DUCT/AMPULLA FLUOROSCOPY TIME:  9 minutes and 6 seconds MEDICATIONS AND MEDICAL HISTORY: Versed 4 mg, Fentanyl 100 mcg. Additional Medications: None.  Patient was on Rocephin. ANESTHESIA/SEDATION: Moderate sedation time: 60 minutes CONTRAST:  15 cc Omnipaque 300 PROCEDURE: The procedure, risks, benefits, and alternatives were explained to the patient. Questions regarding the procedure were encouraged and answered. The patient understands and consents to the procedure. The upper abdominal region was prepped with Betadine in a sterile fashion, and a sterile drape was applied covering the operative field. A sterile gown and sterile gloves were used for the procedure. Under sonographic guidance, a Chiba needle was inserted into a left hepatic duct. Contrast was injected opacifying the biliary tree. The needle was removed over a 018 wire. The Accustick transitional dilator was advanced over the wire to the mid biliary tree. It could not be advanced into the central biliary tree secondary to a very resistant partial obstruction. An Amplatz wire was advanced through the transitional dilator. This was advanced into the central biliary tree. Again, the 6 [place] transitional dilator of the Accustick set could not be advanced across the Amplatz wire. A 4 French glide catheter was advanced across the Amplatz wire into the central biliary tree. The glide catheter was advanced over a Bentson wire into the duodenum then was again exchange for the Amplatz wire. Initial attempts at dilating across the partial obstruction with  serial dilators starting from 5 [place] were unsuccessful. A 5 French sheath was advanced over the Amplatz into the peripheral biliary tree a 4 French balloon was utilized to dilate the partial obstruction. The tract was then easily dilated to 8 [place] an 8 [place] biliary drain was then advanced over the Amplatz into the duodenum. Two additional sideholes were cut above the upper marker. It was looped and string  fixed in the duodenum then sewn to the skin. The initial aspirate was sent for a cytology analysis. Contrast was injected. FINDINGS: Percutaneous cholangiography into the left the biliary tree demonstrates very limited opacification the the bile ducts. This was performed in an attempt to prevent sepsis. Final images demonstrate left internal external biliary drain placement with its tip coiled in the duodenum. Central left-sided ducts only were opacified by contrast. COMPLICATIONS: None IMPRESSION: Successful left internal external biliary drain placement. An 8 French device was placed secondary to a resistant biliary obstruction. Initial bile aspirate was sent for cytology. Electronically Signed   By: Marybelle Killings M.D.   On: 09/17/2015 08:33    Microbiology: Recent Results (from the past 240 hour(s))  Body fluid culture     Status: None   Collection Time: 09/15/15 10:34 AM  Result Value Ref Range Status   Specimen Description PERITONEAL FLUID  Final   Special Requests NONE  Final   Gram Stain   Final    FEW WBC PRESENT, PREDOMINANTLY MONONUCLEAR NO ORGANISMS SEEN Results Called to: [place] [id number] SEVERAL TIMES NO ANSWER [id number] [place]    Culture   Final    NO GROWTH 3 DAYS Performed at [facility]    Report Status 09/18/2015 FINAL  Final  Culture, Urine     Status: None   Collection Time: 09/16/15 12:34 PM  Result Value Ref Range Status   Specimen Description URINE, CLEAN CATCH  Final   Special Requests NONE  Final   Culture   Final    NO GROWTH 1 DAY Performed at [facility]    Report Status 09/17/2015 FINAL  Final  Body fluid culture     Status: None   Collection Time: 09/17/15  5:12 PM  Result Value Ref Range Status   Specimen Description BILE  Final   Special Requests Normal  Final   Gram Stain   Final    RARE WBC PRESENT, PREDOMINANTLY MONONUCLEAR NO ORGANISMS SEEN    Culture   Final    FEW CANDIDA ALBICANS CRITICAL RESULT CALLED TO, READ BACK BY AND VERIFIED WITH: R BALDWIN 09/18/15 @ 76 M VESTAL Performed at [facility]    Report Status 09/22/2015 FINAL  Final  Culture, body fluid-bottle     Status: None   Collection Time: 09/19/15 11:53 AM  Result Value Ref Range Status   Specimen Description FLUID BILE  Final   Special Requests NONE  Final   Gram Stain   Final    GRAM POSITIVE RODS IN BOTH AEROBIC AND ANAEROBIC BOTTLES CRITICAL RESULT CALLED TO, READ BACK BY AND VERIFIED WITH: MEREDITH _0  09/20/15 MKELLY    Culture   Final    MULTIPLE ORGANISMS PRESENT, NONE PREDOMINANT Performed at [facility]    Report Status 09/22/2015 FINAL  Final  Gram stain     Status: None   Collection Time: 09/19/15 11:53 AM  Result Value Ref Range Status   Specimen Description FLUID BILE  Final   Special Requests NONE  Final   Gram Stain   Final    NO WBC SEEN FEW GRAM POSITIVE RODS RARE GRAM NEGATIVE RODS RARE BUDDING YEAST SEEN Performed at [facility]    Report Status 09/19/2015 FINAL  Final     Labs: Basic Metabolic Panel:  Recent Labs Lab 09/18/15 0400 09/19/15 0355 09/20/15 0530 09/22/15 0415 09/23/15 0425  NA 132* 128* 125* 127* 121*  K 4.1 4.0 4.4 4.3 4.2  CL 92* 89* 88* 87* 84*  CO2 32 33* 30 34* 30  GLUCOSE 107* 116* 82 98 120*  BUN 19 19 31* 29* 25*  CREATININE 0.80 0.75 1.13 0.90 0.97  CALCIUM 9.4 9.0 8.8* 9.0 8.7*  MG 1.9  --   --   --   --    Liver Function Tests:  Recent Labs Lab 09/18/15 0400 09/19/15 0355 09/20/15 0530 09/22/15 0415 09/23/15 0425  AST 72* 51* 53* 64*  68*  67*  ALT 43 34 31 34 34  35  ALKPHOS 171* 148* 129* 113 119  112  BILITOT 2.8* 3.0* 3.8* 3.7* 3.5*  3.6*  PROT 7.5 7.0 6.7 7.0 7.2  7.1  ALBUMIN 2.2* 2.1* 2.0* 2.0* 2.1*  2.0*   No results for input(s): LIPASE, AMYLASE in the last 168 hours. No results for input(s): AMMONIA in the last 168 hours. CBC:  Recent Labs Lab 09/18/15 0400 09/19/15 0355 09/20/15 0530 09/22/15 0415 09/23/15 0425  WBC 13.4* 19.6* 17.8* 12.5* 12.2*  NEUTROABS  --   --   --  8.9* 8.7*  HGB 14.8 14.3 14.1 13.3 13.9  HCT 40.9 40.4 39.7 38.0* 39.2  MCV 102.8* 103.3* 102.6* 103.0* 102.6*  PLT 133* 125* 139* 135* 149*   Cardiac Enzymes: No results for input(s): CKTOTAL, CKMB, CKMBINDEX, TROPONINI in the last 168 hours. BNP: BNP (last 3 results) No results for input(s): BNP in the last 8760 hours.  ProBNP (last 3 results) No results for input(s): PROBNP in the last 8760 hours.  CBG: No results for input(s): GLUCAP in the last 168 hours.     SignedNita Sells  [facility] 09/23/2015, 12:20 PM

## 2015-09-24 ENCOUNTER — Other Ambulatory Visit: Payer: Self-pay | Admitting: Hematology

## 2015-09-24 ENCOUNTER — Telehealth: Payer: Self-pay | Admitting: Hematology

## 2015-09-24 ENCOUNTER — Other Ambulatory Visit: Payer: Self-pay | Admitting: General Surgery

## 2015-09-24 DIAGNOSIS — C249 Malignant neoplasm of biliary tract, unspecified: Principal | ICD-10-CM

## 2015-09-24 DIAGNOSIS — C221 Intrahepatic bile duct carcinoma: Secondary | ICD-10-CM | POA: Insufficient documentation

## 2015-09-24 DIAGNOSIS — C240 Malignant neoplasm of extrahepatic bile duct: Secondary | ICD-10-CM

## 2015-09-24 NOTE — Telephone Encounter (Signed)
per pof to sch pt appt-sent Tiffany email to senf med rec/demo to [facility] or [employer]

## 2015-09-25 ENCOUNTER — Telehealth: Payer: Self-pay | Admitting: Hematology

## 2015-09-25 NOTE — Telephone Encounter (Signed)
Spoke with Bonne @ [facility] will fax over medical records for review will notify with appt is scheduled.  [phone]) [phone](fax).

## 2015-09-27 ENCOUNTER — Encounter: Payer: Self-pay | Admitting: Hematology

## 2015-09-27 ENCOUNTER — Emergency Department (HOSPITAL_COMMUNITY): Payer: Medicaid Other

## 2015-09-27 ENCOUNTER — Inpatient Hospital Stay (HOSPITAL_COMMUNITY)
Admission: EM | Admit: 2015-09-27 | Discharge: 2015-10-01 | DRG: 640 | Disposition: A | Discharge status: Home / Self Care | Payer: Medicaid Other | Attending: Internal Medicine | Admitting: Internal Medicine

## 2015-09-27 ENCOUNTER — Other Ambulatory Visit: Payer: Self-pay

## 2015-09-27 ENCOUNTER — Encounter (HOSPITAL_COMMUNITY): Payer: Self-pay | Admitting: Emergency Medicine

## 2015-09-27 DIAGNOSIS — K831 Obstruction of bile duct: Secondary | ICD-10-CM | POA: Diagnosis present

## 2015-09-27 DIAGNOSIS — I8510 Secondary esophageal varices without bleeding: Secondary | ICD-10-CM | POA: Diagnosis present

## 2015-09-27 DIAGNOSIS — R188 Other ascites: Secondary | ICD-10-CM | POA: Diagnosis present

## 2015-09-27 DIAGNOSIS — C221 Intrahepatic bile duct carcinoma: Secondary | ICD-10-CM | POA: Diagnosis present

## 2015-09-27 DIAGNOSIS — R531 Weakness: Secondary | ICD-10-CM | POA: Diagnosis not present

## 2015-09-27 DIAGNOSIS — D72829 Elevated white blood cell count, unspecified: Secondary | ICD-10-CM | POA: Diagnosis present

## 2015-09-27 DIAGNOSIS — E860 Dehydration: Secondary | ICD-10-CM | POA: Diagnosis present

## 2015-09-27 DIAGNOSIS — C249 Malignant neoplasm of biliary tract, unspecified: Secondary | ICD-10-CM

## 2015-09-27 DIAGNOSIS — M21371 Foot drop, right foot: Secondary | ICD-10-CM

## 2015-09-27 DIAGNOSIS — G9341 Metabolic encephalopathy: Secondary | ICD-10-CM | POA: Diagnosis present

## 2015-09-27 DIAGNOSIS — N179 Acute kidney failure, unspecified: Secondary | ICD-10-CM | POA: Diagnosis present

## 2015-09-27 DIAGNOSIS — E871 Hypo-osmolality and hyponatremia: Principal | ICD-10-CM

## 2015-09-27 DIAGNOSIS — Z79899 Other long term (current) drug therapy: Secondary | ICD-10-CM

## 2015-09-27 DIAGNOSIS — C801 Malignant (primary) neoplasm, unspecified: Secondary | ICD-10-CM | POA: Diagnosis present

## 2015-09-27 DIAGNOSIS — R2689 Other abnormalities of gait and mobility: Secondary | ICD-10-CM

## 2015-09-27 DIAGNOSIS — K7469 Other cirrhosis of liver: Secondary | ICD-10-CM | POA: Diagnosis present

## 2015-09-27 DIAGNOSIS — G9340 Encephalopathy, unspecified: Secondary | ICD-10-CM | POA: Diagnosis present

## 2015-09-27 HISTORY — DX: Hypo-osmolality and hyponatremia: E87.1

## 2015-09-27 HISTORY — DX: Hepatomegaly, not elsewhere classified: R16.0

## 2015-09-27 HISTORY — DX: Unspecified cirrhosis of liver: K74.60

## 2015-09-27 LAB — URINE MICROSCOPIC-ADD ON

## 2015-09-27 LAB — OSMOLALITY: OSMOLALITY: 272 mosm/kg — AB (ref 275–295)

## 2015-09-27 LAB — URINALYSIS, ROUTINE W REFLEX MICROSCOPIC
Glucose, UA: NEGATIVE mg/dL
Hgb urine dipstick: NEGATIVE
KETONES UR: NEGATIVE mg/dL
NITRITE: POSITIVE — AB
PH: 5 (ref 5.0–8.0)
Protein, ur: 30 mg/dL — AB
SPECIFIC GRAVITY, URINE: 1.024 (ref 1.005–1.030)

## 2015-09-27 LAB — DIFFERENTIAL
BASOS ABS: 0.1 10*3/uL (ref 0.0–0.1)
BASOS PCT: 0 %
Eosinophils Absolute: 0.1 10*3/uL (ref 0.0–0.7)
Eosinophils Relative: 1 %
LYMPHS PCT: 11 %
Lymphs Abs: 1.9 10*3/uL (ref 0.7–4.0)
Monocytes Absolute: 1.5 10*3/uL — ABNORMAL HIGH (ref 0.1–1.0)
Monocytes Relative: 9 %
NEUTROS ABS: 14.1 10*3/uL — AB (ref 1.7–7.7)
NEUTROS PCT: 80 %

## 2015-09-27 LAB — COMPREHENSIVE METABOLIC PANEL
ALK PHOS: 164 U/L — AB (ref 38–126)
ALT: 79 U/L — ABNORMAL HIGH (ref 17–63)
ANION GAP: 12 (ref 5–15)
AST: 151 U/L — AB (ref 15–41)
Albumin: 2.6 g/dL — ABNORMAL LOW (ref 3.5–5.0)
BILIRUBIN TOTAL: 3.5 mg/dL — AB (ref 0.3–1.2)
BUN: 60 mg/dL — ABNORMAL HIGH (ref 6–20)
CALCIUM: 10.8 mg/dL — AB (ref 8.9–10.3)
CO2: 30 mmol/L (ref 22–32)
Chloride: 74 mmol/L — ABNORMAL LOW (ref 101–111)
Creatinine, Ser: 2.37 mg/dL — ABNORMAL HIGH (ref 0.61–1.24)
GFR calc non Af Amer: 29 mL/min — ABNORMAL LOW (ref >60)
GFR, EST AFRICAN AMERICAN: 33 mL/min — AB (ref >60)
GLUCOSE: 99 mg/dL (ref 65–99)
POTASSIUM: 4.5 mmol/L (ref 3.5–5.1)
Sodium: 116 mmol/L — CL (ref 135–145)
TOTAL PROTEIN: 8.7 g/dL — AB (ref 6.5–8.1)

## 2015-09-27 LAB — CBC
HCT: 45 % (ref 39.0–52.0)
Hemoglobin: 16.7 g/dL (ref 13.0–17.0)
MCH: 36.9 pg — ABNORMAL HIGH (ref 26.0–34.0)
MCHC: 37.1 g/dL — ABNORMAL HIGH (ref 30.0–36.0)
MCV: 99.3 fL (ref 78.0–100.0)
PLATELETS: 164 10*3/uL (ref 150–400)
RBC: 4.53 MIL/uL (ref 4.22–5.81)
RDW: 13.3 % (ref 11.5–15.5)
WBC: 17.8 10*3/uL — AB (ref 4.0–10.5)

## 2015-09-27 LAB — URIC ACID: URIC ACID, SERUM: 12.4 mg/dL — AB (ref 4.4–7.6)

## 2015-09-27 LAB — AMMONIA: Ammonia: 34 umol/L (ref 9–35)

## 2015-09-27 MED ORDER — ONDANSETRON HCL 4 MG PO TABS: 4.0000 mg | ORAL_TABLET | Freq: Four times a day (QID) | ORAL | Status: DC | PRN

## 2015-09-27 MED ORDER — SODIUM CHLORIDE 0.9 % IJ SOLN
3.0000 mL | Freq: Two times a day (BID) | INTRAMUSCULAR | Status: DC
  Administered 2015-09-28 – 2015-10-01 (×3): 3 mL via INTRAVENOUS

## 2015-09-27 MED ORDER — HEPARIN SODIUM (PORCINE) 5000 UNIT/ML IJ SOLN
5000.0000 [IU] | Freq: Three times a day (TID) | INTRAMUSCULAR | Status: DC
  Administered 2015-09-27 – 2015-10-01 (×10): 5000 [IU] via SUBCUTANEOUS
  Filled 2015-09-27 (×14): qty 1

## 2015-09-27 MED ORDER — HYDROCODONE-ACETAMINOPHEN 5-325 MG PO TABS
1.0000 | ORAL_TABLET | ORAL | Status: DC | PRN
  Administered 2015-09-27 – 2015-09-28 (×2): 2 via ORAL
  Filled 2015-09-27 (×2): qty 2

## 2015-09-27 MED ORDER — SODIUM CHLORIDE 0.9 % IV BOLUS (SEPSIS)
1000.0000 mL | Freq: Once | INTRAVENOUS | Status: AC
Start: 2015-09-27 — End: 2015-09-27
  Administered 2015-09-27: 1000 mL via INTRAVENOUS

## 2015-09-27 MED ORDER — ONDANSETRON HCL 4 MG/2ML IJ SOLN
4.0000 mg | Freq: Four times a day (QID) | INTRAMUSCULAR | Status: DC | PRN
Start: 2015-09-27 — End: 2015-10-01

## 2015-09-27 MED ORDER — SODIUM CHLORIDE 0.9 % IV SOLN: INTRAVENOUS | Status: DC

## 2015-09-27 MED ORDER — LIP MEDEX EX OINT
TOPICAL_OINTMENT | CUTANEOUS | Status: AC
  Administered 2015-09-27: 1
  Filled 2015-09-27: qty 7

## 2015-09-27 MED ORDER — FLUCONAZOLE 200 MG PO TABS
200.0000 mg | ORAL_TABLET | Freq: Every day | ORAL | Status: DC
  Administered 2015-09-27 – 2015-10-01 (×5): 200 mg via ORAL
  Filled 2015-09-27 (×5): qty 1

## 2015-09-27 MED ORDER — GUAIFENESIN-DM 100-10 MG/5ML PO SYRP: 5.0000 mL | ORAL_SOLUTION | ORAL | Status: DC | PRN

## 2015-09-27 MED ORDER — AMOXICILLIN-POT CLAVULANATE 875-125 MG PO TABS
1.0000 | ORAL_TABLET | Freq: Two times a day (BID) | ORAL | Status: DC
  Administered 2015-09-27 – 2015-09-28 (×2): 1 via ORAL
  Filled 2015-09-27 (×3): qty 1

## 2015-09-27 MED ORDER — SODIUM CHLORIDE 0.9 % IV SOLN
INTRAVENOUS | Status: AC
  Administered 2015-09-27: 20:00:00 via INTRAVENOUS

## 2015-09-27 MED ORDER — SODIUM CHLORIDE 0.9 % IV BOLUS (SEPSIS): 1000.0000 mL | Freq: Once | INTRAVENOUS | Status: DC

## 2015-09-27 NOTE — ED Notes (Signed)
Patient's companion states that patient was d/c'd Monday 12/12 from [facility] hospital.  Patient state has been taking 2 different antibiotics that is still currently taking for UTI  And yeast infections.  Since d/c patient has become more confused and has had a fall this morning, patient states that does not feel steady and can't keep his balance.  Denies LOC.  Patient states that has an appointment today at [facility] pm [facility] the [facility] for blood draw but, since the fall decided that needed to come to the ED.  Patient denies pain.

## 2015-09-27 NOTE — Progress Notes (Signed)
Pt informed CM he has "been here for two weeks"  Has a GI but no pcp CM spoke with pt who confirms uninsured [employer] resident with no pcp.  CM discussed and provided written information for uninsured accepting pcps, discussed the importance of pcp vs EDP services for f/u care, www.needymeds.org, www.goodrx.com, discounted pharmacies and other [employer] such as [employer], affordable care act, financial assistance, uninsured dental services, [place] med assist, DSS and  health department  Reviewed resources for [employer] uninsured accepting pcps like [facility], family medicine at [employer], [facility], [facility], local urgent care centers, [facility], [facility] family practice, general medical clinics, family services of the [place], [facility] urgent care plus others, medication resources, [facility] out patient pharmacies and housing Pt voiced understanding and appreciation of resources provided  Placed items in pt red belonging of pt Provided [facility] contact information Pt not a candidate for [facility]

## 2015-09-27 NOTE — ED Notes (Signed)
Sodium 116 Called by Larene Beach RN/MD aware

## 2015-09-27 NOTE — ED Notes (Signed)
Patient transported to X-ray and CT 

## 2015-09-27 NOTE — Progress Notes (Signed)
Placed in d/c instructions Please use the resources provided to you in emergency room by case manager to assist with doctor for follow up Schedule an appointment as soon as possible for a visit on 09/30/2015 These [place] uninsured resources provide possible primary care providers, resources for discounted medications, housing, dental resources, affordable care act information, plus other resources for [facility]

## 2015-09-27 NOTE — ED Notes (Signed)
Pt going to floor at 1600

## 2015-09-27 NOTE — H&P (Signed)
Patient Demographics:    Eric Schroeder, is a 59 y.o. male  MRN: [id number]   DOB - 05/14/1956  Admit Date - 09/27/2015  Outpatient Primary MD for the patient is No primary care provider on file.   With History of -  Past Medical History  Diagnosis Date  . Liver mass   . Hyponatremia 09/27/2015  . Hepatic cirrhosis ([place])       No past surgical history on file.  in for   Chief Complaint  Patient presents with  . Fall  . Altered Mental Status      HPI:    Eric Schroeder  is a 59 y.o. male, with recently diagnosed cholangiocarcinoma under the care of Dr. Annamaria Boots, who is recently diagnosed from this hospital after the new diagnosis of cholangiocarcinoma, he underwent a bili drain placement, he was found to be losing a lot of fluid in his bili drain up to 1-1/2-2 L, he subsequently came dehydrated and was hyponatremic till about 4 days ago when his BMP was checked last.  He was to follow with his oncologist Dr. Annamaria Boots today however at home he started to experience gradually progressive weakness, dizziness and had a fall after he got lightheaded at home, he was also seen to be mildly confused, his girlfriend brought him to the ER where he was found to be mildly encephalopathic with severe hyponatremia and dehydration along with acute renal failure. I was called to admit the patient.  He denies any headache, no fever or chills, no focal deficits, no chest pain cough and shortness of breath, no abdominal pain, no blood in stool or urine.    Review of systems:    In addition to the HPI above,  No Fever-chills, No Headache, No changes with Vision or hearing, No problems swallowing food or Liquids, No Chest pain, Cough or Shortness of Breath, No Abdominal pain, No Nausea or Vommitting, Bowel movements are  regular, No Blood in stool or Urine, No dysuria, No new skin rashes or bruises, No new joints pains-aches,  No new weakness, tingling, numbness in any extremity, but does have generalized weakness and mild confusion, No recent weight gain or loss, No polyuria, polydypsia or polyphagia, No significant Mental Stressors.  A full 10 point Review of Systems was done, except as stated above, all other Review of Systems were negative.    Social History:     Social History  Substance Use Topics  . Smoking status: Never Smoker   . Smokeless tobacco: Not on file  . Alcohol Use: No     Comment: stopped 2 months ago.    Lives - at home with his wife fairly mobile      Family History :   Family history of gallbladder malignancies   Home Medications:   Prior to Admission medications   Medication Sig Start Date End Date Taking? Authorizing Provider  acetaminophen (TYLENOL) 500 MG tablet Take  500 mg by mouth every 6 (six) hours as needed for moderate pain.   Yes Historical Provider, MD  amoxicillin-clavulanate (AUGMENTIN) 875-125 MG tablet Take 1 tablet by mouth every 12 (twelve) hours. Patient taking differently: Take 1 tablet by mouth every 12 (twelve) hours. For 5 days 12-12 to 12-16 09/23/15  Yes Nita Sells, MD  fluconazole (DIFLUCAN) 200 MG tablet Take 1 tablet (200 mg total) by mouth daily. 09/23/15  Yes Nita Sells, MD  sodium chloride 1 G tablet Take 1 tablet (1 g total) by mouth 2 (two) times daily with a meal. 09/23/15  Yes Nita Sells, MD  oxyCODONE (OXYCONTIN) 10 mg 12 hr tablet Take 1 tablet (10 mg total) by mouth every 12 (twelve) hours. Patient not taking: Reported on 09/27/2015 09/23/15   Nita Sells, MD     Allergies:    No Known Allergies   Physical Exam:   Vitals  Blood pressure 121/72, pulse 97, temperature 97.7 F (36.5 C), temperature source Oral, resp. rate 21, SpO2 99 %.   1. General middle-aged white male who appears  dehydrated lying in bed in NAD,     2. Normal affect and insight, Not Suicidal or Homicidal, Awake, mildly confused but Oriented X 2.  3. No F.N deficits, ALL C.Nerves Intact, Strength 5/5 all 4 extremities, Sensation intact all 4 extremities, Plantars down going.  4. Ears and Eyes appear Normal, Conjunctivae clear, PERRLA. Moist Oral Mucosa.  5. Supple Neck, No JVD, No cervical lymphadenopathy appriciated, No Carotid Bruits.  6. Symmetrical Chest wall movement, Good air movement bilaterally, CTAB.  7. RRR, No Gallops, Rubs or Murmurs, No Parasternal Heave.  8. Positive Bowel Sounds, Abdomen Soft, No tenderness, No organomegaly appriciated,No rebound -guarding or rigidity. Biliary drain in place with bile colored fluid in the collection bag  9.  No Cyanosis, Normal Skin Turgor, No Skin Rash or Bruise.  10. Good muscle tone,  joints appear normal , no effusions, Normal ROM.  11. No Palpable Lymph Nodes in Neck or Axillae      Data Review:    CBC  Recent Labs Lab 09/22/15 0415 09/23/15 0425 09/27/15 1348  WBC 12.5* 12.2* 17.8*  HGB 13.3 13.9 16.7  HCT 38.0* 39.2 45.0  PLT 135* 149* 164  MCV 103.0* 102.6* 99.3  MCH 36.0* 36.4* 36.9*  MCHC 35.0 35.5 37.1*  RDW 13.6 13.6 13.3  LYMPHSABS 1.4 1.6 1.9  MONOABS 1.9* 1.6* 1.5*  EOSABS 0.3 0.2 0.1  BASOSABS 0.1 0.1 0.1   ------------------------------------------------------------------------------------------------------------------  Chemistries   Recent Labs Lab 09/22/15 0415 09/23/15 0425 09/27/15 1348  NA 127* 121* 116*  K 4.3 4.2 4.5  CL 87* 84* 74*  CO2 34* 30 30  GLUCOSE 98 120* 99  BUN 29* 25* 60*  CREATININE 0.90 0.97 2.37*  CALCIUM 9.0 8.7* 10.8*  AST 64* 68*  67* 151*  ALT 34 34  35 79*  ALKPHOS 113 119  112 164*  BILITOT 3.7* 3.5*  3.6* 3.5*   ------------------------------------------------------------------------------------------------------------------ estimated creatinine clearance is  34.6 mL/min (by C-G formula based on Cr of 2.37). ------------------------------------------------------------------------------------------------------------------ No results for input(s): TSH, T4TOTAL, T3FREE, THYROIDAB in the last 72 hours.  Invalid input(s): FREET3   Coagulation profile  Recent Labs Lab 09/22/15 0415 09/23/15 0425  INR 1.64* 1.67*   ------------------------------------------------------------------------------------------------------------------- No results for input(s): DDIMER in the last 72 hours. -------------------------------------------------------------------------------------------------------------------  Cardiac Enzymes No results for input(s): CKMB, TROPONINI, MYOGLOBIN in the last 168 hours.  Invalid input(s): CK ------------------------------------------------------------------------------------------------------------------ Invalid input(s): POCBNP   ---------------------------------------------------------------------------------------------------------------  Urinalysis    Component Value Date/Time   COLORURINE ORANGE* 09/14/2015 1910   APPEARANCEUR CLEAR 09/14/2015 1910   LABSPEC 1.046* 09/14/2015 1910   PHURINE 5.5 09/14/2015 1910   GLUCOSEU NEGATIVE 09/14/2015 1910   HGBUR SMALL* 09/14/2015 1910   BILIRUBINUR MODERATE* 09/14/2015 1910   KETONESUR 15* 09/14/2015 1910   PROTEINUR NEGATIVE 09/14/2015 1910   NITRITE POSITIVE* 09/14/2015 1910   LEUKOCYTESUR SMALL* 09/14/2015 1910    ----------------------------------------------------------------------------------------------------------------   Imaging Results:    Dg Lumbar Spine Complete  09/27/2015  CLINICAL DATA:  Recent fall with low back pain, initial encounter EXAM: LUMBAR SPINE - COMPLETE 4+ VIEW COMPARISON:  None. FINDINGS: Biliary drainage catheter is noted. Five lumbar type vertebral bodies are well visualized. Mild osteophytic changes are seen. No spondylolisthesis is  noted. No compression deformity is noted. No gross soft tissue abnormality is seen. IMPRESSION: Mild degenerative change without acute abnormality. Electronically Signed   By: Inez Catalina M.D.   On: 09/27/2015 14:16   Ct Head Wo Contrast  09/27/2015  CLINICAL DATA:  Balance disorder ; right foot drop. Fall. History of cholangiocarcinoma. EXAM: CT HEAD WITHOUT CONTRAST TECHNIQUE: Contiguous axial images were obtained from the base of the skull through the vertex without intravenous contrast. COMPARISON:  None. FINDINGS: There is mild diffuse atrophy. There is mild invagination of CSF into the sella. There is no intracranial mass, hemorrhage, extra-axial fluid collection, or midline shift. Gray-white compartments are normal. No acute infarct evident. The bony calvarium appears intact. The mastoid air cells are clear. No intraorbital lesions are identified. IMPRESSION: Mild atrophy. Mild degree of empty sella. No intracranial mass, hemorrhage, or extra-axial fluid collection. No focal gray-white compartment lesions are identified. Electronically Signed   By: Lowella Grip III M.D.   On: 09/27/2015 14:17    My personal review of EKG: Rhythm NSR, Rate  99 /min,  no Acute ST changes   Assessment & Plan:     1. Mild metabolic encephalopathy due to Severe dehydration, hyponatremia and ARF due to fluid loss from biliary drain - he will be admitted, to [facility] [place] bed, check serum osmolarity, urine sodium and osmolality, TSH and a.m. cortisol, will hydrate him with 1 L normal saline bolus thereafter 1 25 mL an hour, I think he can be corrected fairly fast as he appears severely dehydrated. Will repeat BMP in the morning. PT evaluation in the morning.   2. Cholangiocarcinoma with bili drain, recent testing of biliary fluid had shown fungual growth. His case one week ago was discussed by the discharging physician with ID physician. He was placed on Augmentin along with Diflucan until Christmas Day, this will be  continued. I have also consulted Dr. Annamaria Boots oncologist to see the patient.    DVT Prophylaxis Heparin   AM Labs Ordered, also please review Full Orders  Family Communication: Admission, patients condition and plan of care including tests being ordered have been discussed with the patient who indicates understanding and agree with the plan and Code Status.  Code Status Full  Likely DC to Home 2-3 days  Condition GUARDED     Time spent in minutes : 35    Eloise Mula K M.D on 09/27/2015 at 3:49 PM  Between 7am to 7pm - Pager - [phone]  After 7pm go to [email or web address] - password [facility]  [facility] - Office  [phone]

## 2015-09-27 NOTE — ED Notes (Addendum)
Patient comes from home with c/o weakness that became acute today.  Patient states he stood up at home and stumbled, taking 5 steps back before he fell down, landing on his sacrum.  Patient has been recently diagnosed with cholangiocarcinoma.  Patient denies N/V/D, fever and dizziness.  General: Awake, alert and oriented, under-nourished, NAD Cardio: RRR, S1S2 heard, no murmurs appreciated, no edema, cyanosis or clubbing; +1 radial pulses bilaterally Pulm: CTAB, no wheezes, rhonchi or rales GI: Soft, NT/ND,+BS x4, hypoactive. Drain in place mid-line abdomen Skin: Dry, intact, no rashes, occasional bruising on arms and legs. Neuro: Strength and sensation grossly intact, patient alert and oriented, but appears lethargic

## 2015-09-27 NOTE — Progress Notes (Signed)
EDCM spoke to patient at bedside.  Patient is without insurance, no pcp.  [facility] provided patient with cone financial assistance application.  Patient reports he is going to have [employer] by Jan 1st.

## 2015-09-27 NOTE — ED Notes (Signed)
Patient informed we need a urine specimen.  Unable to urinate.  Chole bag emptied.

## 2015-09-27 NOTE — ED Provider Notes (Signed)
CSN: [id number]     Arrival date & time 09/27/15  1124 History   First MD Initiated Contact with Patient 09/27/15 1154     Chief Complaint  Patient presents with  . Fall  . Altered Mental Status     (Consider location/radiation/quality/duration/timing/severity/associated sxs/prior Treatment) HPI Comments: 59 year old male with recently diagnosed cholangiocarcinoma who presents with confusion. History obtained with the assistance of the patient's significant other who is at bedside. The patient was discharged from the hospital on 12/12 after diagnosis of cholangiocarcinoma. He has been on antibiotics at home but since discharge she has been, more confused and disoriented. He seems unsteady on his feet and he states he feels like he has problems with his balance. He has not hit his head or had any loss of consciousness. No fevers, vomiting, abdominal pain, change in his ostomy output, or urinary symptoms. He has noticed for several weeks that it is difficult to dorsiflex his right foot. No recent injury. No back pain currently but he intermittently has back pain when he is sitting in an uncomfortable chair.  Patient is a 59 y.o. male presenting with fall and altered mental status. The history is provided by the patient and the spouse.  Fall  Altered Mental Status   Past Medical History  Diagnosis Date  . Liver mass    No past surgical history on file. No family history on file. Social History  Substance Use Topics  . Smoking status: Never Smoker   . Smokeless tobacco: None  . Alcohol Use: No     Comment: stopped 2 months ago.    Review of Systems 10 Systems reviewed and are negative for acute change except as noted in the HPI.    Allergies  Review of patient's allergies indicates no known allergies.  Home Medications   Prior to Admission medications   Medication Sig Start Date End Date Taking? Authorizing Provider  acetaminophen (TYLENOL) 500 MG tablet Take 500 mg by mouth  every 6 (six) hours as needed for moderate pain.   Yes Historical Provider, MD  amoxicillin-clavulanate (AUGMENTIN) 875-125 MG tablet Take 1 tablet by mouth every 12 (twelve) hours. Patient taking differently: Take 1 tablet by mouth every 12 (twelve) hours. For 5 days 12-12 to 12-16 09/23/15  Yes Nita Sells, MD  fluconazole (DIFLUCAN) 200 MG tablet Take 1 tablet (200 mg total) by mouth daily. 09/23/15  Yes Nita Sells, MD  sodium chloride 1 G tablet Take 1 tablet (1 g total) by mouth 2 (two) times daily with a meal. 09/23/15  Yes Nita Sells, MD  oxyCODONE (OXYCONTIN) 10 mg 12 hr tablet Take 1 tablet (10 mg total) by mouth every 12 (twelve) hours. Patient not taking: Reported on 09/27/2015 09/23/15   Nita Sells, MD   BP 101/69 mmHg  Pulse 105  Temp(Src) 97.7 F (36.5 C) (Oral)  Resp 21  SpO2 99% Physical Exam  Constitutional: He is oriented to person, place, and time.  Cachectic, chronically ill appearing man in NAD  HENT:  Head: Normocephalic and atraumatic.  dry mucous membranes  Eyes: Pupils are equal, round, and reactive to light. Scleral icterus is present.  Neck: Neck supple.  Cardiovascular: Normal rate, regular rhythm and normal heart sounds.   No murmur heard. Pulmonary/Chest: Effort normal and breath sounds normal.  Abdominal: Soft. Bowel sounds are normal. He exhibits no distension.  Drain in L mid abdomen draining serous green fluid, no significant abd tenderness, no purulent drainage  Musculoskeletal: He exhibits no edema.  Neurological: He is alert and oriented to person, place, and time. He has normal reflexes. No cranial nerve deficit.  Fluent speech; 5/5 strength b/l UE; 5/5 strength LLE; 5/5 strength R hip and knee flexion and extension, plantar flexion, however R foot drop noted with inability to dorsiflex foot ; no asterixis; normal finger-to-nose testing  Skin: Skin is warm and dry.  jaundice  Psychiatric: He has a normal mood and  affect. Judgment normal.  Nursing note and vitals reviewed.   ED Course  Procedures (including critical care time) Labs Review Labs Reviewed  CBC - Abnormal; Notable for the following:    WBC 17.8 (*)    MCH 36.9 (*)    MCHC 37.1 (*)    All other components within normal limits  COMPREHENSIVE METABOLIC PANEL - Abnormal; Notable for the following:    Sodium 116 (*)    Chloride 74 (*)    BUN 60 (*)    Creatinine, Ser 2.37 (*)    Calcium 10.8 (*)    Total Protein 8.7 (*)    Albumin 2.6 (*)    AST 151 (*)    ALT 79 (*)    Alkaline Phosphatase 164 (*)    Total Bilirubin 3.5 (*)    GFR calc non Af Amer 29 (*)    GFR calc Af Amer 33 (*)    All other components within normal limits  DIFFERENTIAL - Abnormal; Notable for the following:    Neutro Abs 14.1 (*)    Monocytes Absolute 1.5 (*)    All other components within normal limits  URINE CULTURE  AMMONIA  CBC WITH DIFFERENTIAL/PLATELET  URINALYSIS, ROUTINE W REFLEX MICROSCOPIC (NOT AT [facility])  OSMOLALITY  OSMOLALITY, URINE  URINALYSIS, ROUTINE W REFLEX MICROSCOPIC (NOT AT [facility])  SODIUM, URINE, RANDOM  URIC ACID  CREATININE, URINE, RANDOM    Imaging Review Dg Lumbar Spine Complete  09/27/2015  CLINICAL DATA:  Recent fall with low back pain, initial encounter EXAM: LUMBAR SPINE - COMPLETE 4+ VIEW COMPARISON:  None. FINDINGS: Biliary drainage catheter is noted. Five lumbar type vertebral bodies are well visualized. Mild osteophytic changes are seen. No spondylolisthesis is noted. No compression deformity is noted. No gross soft tissue abnormality is seen. IMPRESSION: Mild degenerative change without acute abnormality. Electronically Signed   By: Inez Catalina M.D.   On: 09/27/2015 14:16   Ct Head Wo Contrast  09/27/2015  CLINICAL DATA:  Balance disorder ; right foot drop. Fall. History of cholangiocarcinoma. EXAM: CT HEAD WITHOUT CONTRAST TECHNIQUE: Contiguous axial images were obtained from the base of the skull through the  vertex without intravenous contrast. COMPARISON:  None. FINDINGS: There is mild diffuse atrophy. There is mild invagination of CSF into the sella. There is no intracranial mass, hemorrhage, extra-axial fluid collection, or midline shift. Gray-white compartments are normal. No acute infarct evident. The bony calvarium appears intact. The mastoid air cells are clear. No intraorbital lesions are identified. IMPRESSION: Mild atrophy. Mild degree of empty sella. No intracranial mass, hemorrhage, or extra-axial fluid collection. No focal gray-white compartment lesions are identified. Electronically Signed   By: Lowella Grip III M.D.   On: 09/27/2015 14:17   I have personally reviewed and evaluated these lab results as part of my medical decision-making.   EKG Interpretation   Date/Time:  Friday September 27 2015 11:56:51 EST Ventricular Rate:  99 PR Interval:  168 QRS Duration: 87 QT Interval:  350 QTC Calculation: 449 R Axis:   -76 Text Interpretation:  Sinus rhythm  LAD, consider left anterior fascicular  block Abnormal R-wave progression, late transition No previous ECGs  available Confirmed by LITTLE MD, RACHEL [phone]) on 09/27/2015 12:11:23 PM      MDM   Final diagnoses:  Hyponatremia  AKI (acute kidney injury) ([place])  Right foot drop  Cholangiocarcinoma ([place])   PT w/ recent diagnosis of cholangiocarcinoma p/w several days of balance problems and worsening confusion since discharge from the hospital. No fevers and no complaints of pain. On exam, pt was chronically ill appearing but comfortable, awake and alert, answering questions appropriately but he was hoarse while talking. VS notable for tachycardia that had improved by the time of my exam, mild hypotension mid 0000000 systolic. No abd tenderness, abd drain w/ clear fluid. R foot drop noted on neuro exam, no asterixis. Obtained above labs as well as head CT and lumbar XR to w/u foot drop.   No acute findings on imaging. Labs notable  for Na 116, Cl 74, Cr 2.37 significantly elevated from baseline, AST 151, ALT 79, Bili 3.5. WBC 17.8. Labs suggestive of severe dehydration and I suspect hyponatremia is cause of confusion. The etiology of his foot drop remains unclear. I ordered an IVF bolus but after discussion with [facility], ,Dr. Candiss Norse, I cancelled fluid bolus at his request because of his concern for too-rapid sodium correction. I have ordered UA but pt unable to urinate at this time; will do bladder scan and I&O cath if urine. Pt admitted to general medicine in guarded condition.   Sharlett Iles, MD 09/28/15 [phone]

## 2015-09-27 NOTE — Progress Notes (Signed)
Pt was admitted to [facility].    This encounter was created in error - please disregard.

## 2015-09-28 ENCOUNTER — Inpatient Hospital Stay (HOSPITAL_COMMUNITY): Payer: Medicaid Other

## 2015-09-28 DIAGNOSIS — N179 Acute kidney failure, unspecified: Secondary | ICD-10-CM

## 2015-09-28 DIAGNOSIS — G9340 Encephalopathy, unspecified: Secondary | ICD-10-CM

## 2015-09-28 DIAGNOSIS — K831 Obstruction of bile duct: Secondary | ICD-10-CM

## 2015-09-28 DIAGNOSIS — C249 Malignant neoplasm of biliary tract, unspecified: Secondary | ICD-10-CM

## 2015-09-28 LAB — URINALYSIS, ROUTINE W REFLEX MICROSCOPIC
Glucose, UA: NEGATIVE mg/dL
Hgb urine dipstick: NEGATIVE
KETONES UR: NEGATIVE mg/dL
NITRITE: POSITIVE — AB
PH: 5 (ref 5.0–8.0)
Protein, ur: NEGATIVE mg/dL
Specific Gravity, Urine: 1.024 (ref 1.005–1.030)

## 2015-09-28 LAB — BASIC METABOLIC PANEL
ANION GAP: 11 (ref 5–15)
Anion gap: 13 (ref 5–15)
BUN: 66 mg/dL — AB (ref 6–20)
BUN: 71 mg/dL — ABNORMAL HIGH (ref 6–20)
CALCIUM: 9.1 mg/dL (ref 8.9–10.3)
CO2: 25 mmol/L (ref 22–32)
CO2: 26 mmol/L (ref 22–32)
CREATININE: 3.42 mg/dL — AB (ref 0.61–1.24)
Calcium: 9.7 mg/dL (ref 8.9–10.3)
Chloride: 79 mmol/L — ABNORMAL LOW (ref 101–111)
Chloride: 83 mmol/L — ABNORMAL LOW (ref 101–111)
Creatinine, Ser: 3.55 mg/dL — ABNORMAL HIGH (ref 0.61–1.24)
GFR calc Af Amer: 21 mL/min — ABNORMAL LOW (ref >60)
GFR, EST AFRICAN AMERICAN: 20 mL/min — AB (ref >60)
GFR, EST NON AFRICAN AMERICAN: 18 mL/min — AB (ref >60)
GFR, EST NON AFRICAN AMERICAN: 18 mL/min — AB (ref >60)
GLUCOSE: 87 mg/dL (ref 65–99)
Glucose, Bld: 168 mg/dL — ABNORMAL HIGH (ref 65–99)
POTASSIUM: 4.6 mmol/L (ref 3.5–5.1)
POTASSIUM: 4.5 mmol/L (ref 3.5–5.1)
Sodium: 117 mmol/L — CL (ref 135–145)
Sodium: 120 mmol/L — ABNORMAL LOW (ref 135–145)

## 2015-09-28 LAB — CBC
HEMATOCRIT: 41.9 % (ref 39.0–52.0)
Hemoglobin: 15.2 g/dL (ref 13.0–17.0)
MCH: 36.4 pg — ABNORMAL HIGH (ref 26.0–34.0)
MCHC: 36.3 g/dL — AB (ref 30.0–36.0)
MCV: 100.2 fL — ABNORMAL HIGH (ref 78.0–100.0)
PLATELETS: 162 10*3/uL (ref 150–400)
RBC: 4.18 MIL/uL — ABNORMAL LOW (ref 4.22–5.81)
RDW: 13.5 % (ref 11.5–15.5)
WBC: 17.1 10*3/uL — ABNORMAL HIGH (ref 4.0–10.5)

## 2015-09-28 LAB — URINE MICROSCOPIC-ADD ON
RBC / HPF: NONE SEEN RBC/hpf (ref 0–5)
SQUAMOUS EPITHELIAL / LPF: NONE SEEN

## 2015-09-28 LAB — CORTISOL: Cortisol, Plasma: 14 ug/dL

## 2015-09-28 LAB — SODIUM, URINE, RANDOM

## 2015-09-28 LAB — LIPASE, BLOOD: Lipase: 127 U/L — ABNORMAL HIGH (ref 11–51)

## 2015-09-28 LAB — CREATININE, URINE, RANDOM: CREATININE, URINE: 268.37 mg/dL

## 2015-09-28 LAB — TSH: TSH: 5.393 u[IU]/mL — ABNORMAL HIGH (ref 0.350–4.500)

## 2015-09-28 LAB — OSMOLALITY, URINE: Osmolality, Ur: 331 mOsm/kg (ref 300–900)

## 2015-09-28 MED ORDER — SODIUM CHLORIDE 0.9 % IV BOLUS (SEPSIS): 1000.0000 mL | Freq: Once | INTRAVENOUS | Status: AC

## 2015-09-28 MED ORDER — SODIUM CHLORIDE 1 G PO TABS
1.0000 g | ORAL_TABLET | Freq: Three times a day (TID) | ORAL | Status: DC
  Administered 2015-09-28 – 2015-10-01 (×9): 1 g via ORAL
  Filled 2015-09-28 (×12): qty 1

## 2015-09-28 MED ORDER — SODIUM CHLORIDE 0.9 % IV BOLUS (SEPSIS)
1000.0000 mL | Freq: Once | INTRAVENOUS | Status: AC
  Administered 2015-09-28: 1000 mL via INTRAVENOUS

## 2015-09-28 MED ORDER — SODIUM CHLORIDE 0.9 % IV BOLUS (SEPSIS)
1000.0000 mL | Freq: Once | INTRAVENOUS | Status: AC
Start: 2015-09-28 — End: 2015-09-28
  Administered 2015-09-28: 1000 mL via INTRAVENOUS

## 2015-09-28 MED ORDER — AMOXICILLIN-POT CLAVULANATE 500-125 MG PO TABS
500.0000 mg | ORAL_TABLET | Freq: Two times a day (BID) | ORAL | Status: DC
  Administered 2015-09-28 – 2015-10-01 (×6): 500 mg via ORAL
  Filled 2015-09-28 (×7): qty 1

## 2015-09-28 NOTE — Progress Notes (Signed)
Referring Physician(s): [facility] Dr. Erlinda Hong  Chief Complaint: Biliary obstruction s/p perc biliary drain   Subjective: Patient admitted with weakness and lightheadedness. He was found to be dehydrated and states his biliary drain has been putting out over 2 liters each day. He denies any fever or chills. He denies any abdominal pain.   Allergies: Review of patient's allergies indicates no known allergies.  Medications: Prior to Admission medications   Medication Sig Start Date End Date Taking? Authorizing Provider  acetaminophen (TYLENOL) 500 MG tablet Take 500 mg by mouth every 6 (six) hours as needed for moderate pain.   Yes Historical Provider, MD  amoxicillin-clavulanate (AUGMENTIN) 875-125 MG tablet Take 1 tablet by mouth every 12 (twelve) hours. Patient taking differently: Take 1 tablet by mouth every 12 (twelve) hours. For 5 days 12-12 to 12-16 09/23/15  Yes Nita Sells, MD  fluconazole (DIFLUCAN) 200 MG tablet Take 1 tablet (200 mg total) by mouth daily. 09/23/15  Yes Nita Sells, MD  sodium chloride 1 G tablet Take 1 tablet (1 g total) by mouth 2 (two) times daily with a meal. 09/23/15  Yes Nita Sells, MD  oxyCODONE (OXYCONTIN) 10 mg 12 hr tablet Take 1 tablet (10 mg total) by mouth every 12 (twelve) hours. Patient not taking: Reported on 09/27/2015 09/23/15   Nita Sells, MD   Vital Signs: BP 107/74 mmHg  Pulse 83  Temp(Src) 97.7 F (36.5 C) (Oral)  Resp 18  Ht 5\' 9"  (1.753 m)  Wt 153 lb 14.4 oz (69.809 kg)  BMI 22.72 kg/m2  SpO2 100%  Physical Exam General: A&Ox3, NAD Abd: Soft, NT, ND, biliary drain intact with 150 cc light bilious output in bag  Imaging: Dg Lumbar Spine Complete  09/27/2015  CLINICAL DATA:  Recent fall with low back pain, initial encounter EXAM: LUMBAR SPINE - COMPLETE 4+ VIEW COMPARISON:  None. FINDINGS: Biliary drainage catheter is noted. Five lumbar type vertebral bodies are well visualized. Mild osteophytic  changes are seen. No spondylolisthesis is noted. No compression deformity is noted. No gross soft tissue abnormality is seen. IMPRESSION: Mild degenerative change without acute abnormality. Electronically Signed   By: Inez Catalina M.D.   On: 09/27/2015 14:16   Ct Head Wo Contrast  09/27/2015  CLINICAL DATA:  Balance disorder ; right foot drop. Fall. History of cholangiocarcinoma. EXAM: CT HEAD WITHOUT CONTRAST TECHNIQUE: Contiguous axial images were obtained from the base of the skull through the vertex without intravenous contrast. COMPARISON:  None. FINDINGS: There is mild diffuse atrophy. There is mild invagination of CSF into the sella. There is no intracranial mass, hemorrhage, extra-axial fluid collection, or midline shift. Gray-white compartments are normal. No acute infarct evident. The bony calvarium appears intact. The mastoid air cells are clear. No intraorbital lesions are identified. IMPRESSION: Mild atrophy. Mild degree of empty sella. No intracranial mass, hemorrhage, or extra-axial fluid collection. No focal gray-white compartment lesions are identified. Electronically Signed   By: Lowella Grip III M.D.   On: 09/27/2015 14:17    Labs:  CBC:  Recent Labs  09/22/15 0415 09/23/15 0425 09/27/15 1348 09/28/15 0541  WBC 12.5* 12.2* 17.8* 17.1*  HGB 13.3 13.9 16.7 15.2  HCT 38.0* 39.2 45.0 41.9  PLT 135* 149* 164 162    COAGS:  Recent Labs  09/15/15 1208  09/19/15 0355 09/20/15 0530 09/22/15 0415 09/23/15 0425  INR 1.57*  < > 1.68* 1.64* 1.64* 1.67*  APTT 38*  --   --   --   --   --   < > =  values in this interval not displayed.  BMP:  Recent Labs  09/22/15 0415 09/23/15 0425 09/27/15 1348 09/28/15 0541  NA 127* 121* 116* 117*  K 4.3 4.2 4.5 4.6  CL 87* 84* 74* 79*  CO2 34* 30 30 25   GLUCOSE 98 120* 99 87  BUN 29* 25* 60* 66*  CALCIUM 9.0 8.7* 10.8* 9.7  CREATININE 0.90 0.97 2.37* 3.42*  GFRNONAA >60 >60 29* 18*  GFRAA >60 >60 33* 21*    LIVER  FUNCTION TESTS:  Recent Labs  09/20/15 0530 09/22/15 0415 09/23/15 0425 09/27/15 1348  BILITOT 3.8* 3.7* 3.5*  3.6* 3.5*  AST 53* 64* 68*  67* 151*  ALT 31 34 34  35 79*  ALKPHOS 129* 113 119  112 164*  PROT 6.7 7.0 7.2  7.1 8.7*  ALBUMIN 2.0* 2.0* 2.1*  2.0* 2.6*    Assessment and Plan: Biliary obstruction  S/p internal/external biliary drain placed 09/16/15 S/p FNA biliary/hepatic tumor revealed adenocarcinoma 12/9 S/p cholangiogram and exchange of internal/external biliary drain with placement of new 10 F drain terminating in duodenum 09/20/15 Admitted 12/16 with dehydration/hyponatremia/ARF and over 2L of bilious output via drain Discussed case with Dr. Barbie Banner and will do capping trial starting today Follow CMP   Signed: Hedy Jacob 09/28/2015, 11:21 AM   I spent a total of 15 Minutes at the the patient's bedside AND on the patient's hospital floor or unit, greater than 50% of which was counseling/coordinating care for biliary obstruction

## 2015-09-28 NOTE — Progress Notes (Signed)
Repeat bmp in pm with slight improvement of na, but worsening of cr, renal US unremarkable, additional ns bolus given (three liter total bolus today), repeat bmp in 6hrs.

## 2015-09-28 NOTE — Progress Notes (Signed)
PROGRESS NOTE  Eric Schroeder [id number] DOB: 10-Mar-1956 DOA: 09/27/2015 PCP: No primary care provider on file.  HPI/Recap of past 24 hours:  Denies pain, no nausea, report poor appetite, generalized weakness, hoarseness  Assessment/Plan: Principal Problem:   Hyponatremia Active Problems:   Ascites   Biliary obstruction due to malignant neoplasm   Cholangiocarcinoma of biliary tract (HCC)   Encephalopathy acute  metabolic encephalopathy: likely multifactorial from hyponatremia and arf. Treat underline causes.  Hyponatremia: likely from dehydration, less likely  SIADH as urine sodium less than 10. Continue ivf/salt tabs, repeat lab in pm.  ARF: renal US pending, urine culture pending, on ivf  Leukocytosis: no fever, likely from dehydartion  Cholangiocarcinoma with bili drain/elevation of lft,  recent testing of biliary fluid had shown fungual growth. His case one week ago was discussed by the discharging physician with ID physician. He was placed on Augmentin along with Diflucan until Christmas Day, this is continued.  Oncologist Dr. Burr Medico consulted. Large amount of drain output >2liter daily, Interventional radiology consulted for drain management  Code Status: full  Family Communication: patient   Disposition Plan: pending   Consultants:  Oncology  Nephrology ( admitting md discussed with nephrology)  IR  Procedures:  none  Antibiotics:  augmentin  diflucan   Objective: BP 107/74 mmHg  Pulse 83  Temp(Src) 97.7 F (36.5 C) (Oral)  Resp 18  Ht 5\' 9"  (1.753 m)  Wt 153 lb 14.4 oz (69.809 kg)  BMI 22.72 kg/m2  SpO2 100%  Intake/Output Summary (Last 24 hours) at 09/28/15 0803 Last data filed at 09/28/15 [id number]  Gross per 24 hour  Intake      0 ml  Output 2100.5 ml  Net -2100.5 ml   Filed Weights   09/27/15 1711  Weight: 153 lb 14.4 oz (69.809 kg)    Exam:   General:  Thin, NAD  Cardiovascular: RRR  Respiratory: CTABL  Abdomen:  Soft/ND/NT, positive BS, biliary drain, no pus, no blood  Musculoskeletal: No Edema  Neuro: aaox3  Data Reviewed: Basic Metabolic Panel:  Recent Labs Lab 09/22/15 0415 09/23/15 0425 09/27/15 1348 09/28/15 0541  NA 127* 121* 116* 117*  K 4.3 4.2 4.5 4.6  CL 87* 84* 74* 79*  CO2 34* 30 30 25   GLUCOSE 98 120* 99 87  BUN 29* 25* 60* 66*  CREATININE 0.90 0.97 2.37* 3.42*  CALCIUM 9.0 8.7* 10.8* 9.7   Liver Function Tests:  Recent Labs Lab 09/22/15 0415 09/23/15 0425 09/27/15 1348  AST 64* 68*  67* 151*  ALT 34 34  35 79*  ALKPHOS 113 119  112 164*  BILITOT 3.7* 3.5*  3.6* 3.5*  PROT 7.0 7.2  7.1 8.7*  ALBUMIN 2.0* 2.1*  2.0* 2.6*   No results for input(s): LIPASE, AMYLASE in the last 168 hours.  Recent Labs Lab 09/27/15 1344  AMMONIA 34   CBC:  Recent Labs Lab 09/22/15 0415 09/23/15 0425 09/27/15 1348 09/28/15 0541  WBC 12.5* 12.2* 17.8* 17.1*  NEUTROABS 8.9* 8.7* 14.1*  --   HGB 13.3 13.9 16.7 15.2  HCT 38.0* 39.2 45.0 41.9  MCV 103.0* 102.6* 99.3 100.2*  PLT 135* 149* 164 162   Cardiac Enzymes:   No results for input(s): CKTOTAL, CKMB, CKMBINDEX, TROPONINI in the last 168 hours. BNP (last 3 results) No results for input(s): BNP in the last 8760 hours.  ProBNP (last 3 results) No results for input(s): PROBNP in the last 8760 hours.  CBG: No results for input(s): GLUCAP  in the last 168 hours.  Recent Results (from the past 240 hour(s))  Culture, body fluid-bottle     Status: None   Collection Time: 09/19/15 11:53 AM  Result Value Ref Range Status   Specimen Description FLUID BILE  Final   Special Requests NONE  Final   Gram Stain   Final    GRAM POSITIVE RODS IN BOTH AEROBIC AND ANAEROBIC BOTTLES CRITICAL RESULT CALLED TO, READ BACK BY AND VERIFIED WITH: MEREDITH @0509  09/20/15 MKELLY    Culture   Final    MULTIPLE ORGANISMS PRESENT, NONE PREDOMINANT Performed at [facility]    Report Status 09/22/2015 FINAL  Final  Gram  stain     Status: None   Collection Time: 09/19/15 11:53 AM  Result Value Ref Range Status   Specimen Description FLUID BILE  Final   Special Requests NONE  Final   Gram Stain   Final    NO WBC SEEN FEW GRAM POSITIVE RODS RARE GRAM NEGATIVE RODS RARE BUDDING YEAST SEEN Performed at [facility]    Report Status 09/19/2015 FINAL  Final     Studies: Dg Lumbar Spine Complete  09/27/2015  CLINICAL DATA:  Recent fall with low back pain, initial encounter EXAM: LUMBAR SPINE - COMPLETE 4+ VIEW COMPARISON:  None. FINDINGS: Biliary drainage catheter is noted. Five lumbar type vertebral bodies are well visualized. Mild osteophytic changes are seen. No spondylolisthesis is noted. No compression deformity is noted. No gross soft tissue abnormality is seen. IMPRESSION: Mild degenerative change without acute abnormality. Electronically Signed   By: Inez Catalina M.D.   On: 09/27/2015 14:16   Ct Head Wo Contrast  09/27/2015  CLINICAL DATA:  Balance disorder ; right foot drop. Fall. History of cholangiocarcinoma. EXAM: CT HEAD WITHOUT CONTRAST TECHNIQUE: Contiguous axial images were obtained from the base of the skull through the vertex without intravenous contrast. COMPARISON:  None. FINDINGS: There is mild diffuse atrophy. There is mild invagination of CSF into the sella. There is no intracranial mass, hemorrhage, extra-axial fluid collection, or midline shift. Gray-white compartments are normal. No acute infarct evident. The bony calvarium appears intact. The mastoid air cells are clear. No intraorbital lesions are identified. IMPRESSION: Mild atrophy. Mild degree of empty sella. No intracranial mass, hemorrhage, or extra-axial fluid collection. No focal gray-white compartment lesions are identified. Electronically Signed   By: Lowella Grip III M.D.   On: 09/27/2015 14:17    Scheduled Meds: . amoxicillin-clavulanate  1 tablet Oral Q12H  . fluconazole  200 mg Oral Daily  . heparin  5,000  Units Subcutaneous 3 times per day  . sodium chloride  3 mL Intravenous Q12H  . sodium chloride  1 g Oral TID WC    Continuous Infusions: . sodium chloride 125 mL/hr at 09/27/15 2009     Time spent: 74mins  Milley Vining MD, PhD  [facility] Pager [phone]. If 7PM-7AM, please contact night-coverage at [email or web address], password [facility] 09/28/2015, 8:03 AM  LOS: 1 day

## 2015-09-28 NOTE — Progress Notes (Signed)
Writer called to pt's room by pt, where pt told writer that he was having some low mid back pain. Questioned whether he needed to urinate (pt urinated X one last night of 75cc). Writer bladder scanned pt to note 154cc of urine in bladder. Gave pt ordered PRN dose of Vicodin. Will continue to monitor pt and pain.

## 2015-09-28 NOTE — Progress Notes (Signed)
PT Note  Patient Details Name: Eric Schroeder MRN: [id number] DOB: [birth year]   Chart reviewed  And spoke with pt. Pt states he didn't have trouble with walking at home except right before this hospitalization due to his dehydration. He is doing better now and ambulating to bathroom independently and walked to [facility] for radiology. Feel no PT needs at this time. Please call if pt feels otherwise later. Thanks.      Clide Dales 09/28/2015, 1:47 PM  Clide Dales, PT Pager: [phone] 09/28/2015

## 2015-09-28 NOTE — Progress Notes (Signed)
CRITICAL VALUE ALERT  Critical value received:  Na 117  Date of notification:  12/17  Time of notification:  0642   Critical value read back:Yes.    Nurse who received alert:  Stefanie Hodgens rn  MD notified (1st page):  callahan  Time of first page:  [phone]  MD notified (2nd page):  Time of second page:  Responding MD:  callahan  Time MD responded:  [phone]

## 2015-09-29 LAB — CBC
HEMATOCRIT: 36.8 % — AB (ref 39.0–52.0)
HEMOGLOBIN: 13.3 g/dL (ref 13.0–17.0)
MCH: 36.3 pg — ABNORMAL HIGH (ref 26.0–34.0)
MCHC: 36.1 g/dL — AB (ref 30.0–36.0)
MCV: 100.5 fL — ABNORMAL HIGH (ref 78.0–100.0)
Platelets: 126 10*3/uL — ABNORMAL LOW (ref 150–400)
RBC: 3.66 MIL/uL — AB (ref 4.22–5.81)
RDW: 13.5 % (ref 11.5–15.5)
WBC: 12.4 10*3/uL — ABNORMAL HIGH (ref 4.0–10.5)

## 2015-09-29 LAB — URINE CULTURE: CULTURE: NO GROWTH

## 2015-09-29 LAB — COMPREHENSIVE METABOLIC PANEL
ALBUMIN: 1.9 g/dL — AB (ref 3.5–5.0)
ALK PHOS: 114 U/L (ref 38–126)
ALT: 60 U/L (ref 17–63)
ANION GAP: 9 (ref 5–15)
AST: 111 U/L — AB (ref 15–41)
BILIRUBIN TOTAL: 2.7 mg/dL — AB (ref 0.3–1.2)
BUN: 68 mg/dL — AB (ref 6–20)
CALCIUM: 8.9 mg/dL (ref 8.9–10.3)
CO2: 22 mmol/L (ref 22–32)
CREATININE: 2.28 mg/dL — AB (ref 0.61–1.24)
Chloride: 93 mmol/L — ABNORMAL LOW (ref 101–111)
GFR calc Af Amer: 35 mL/min — ABNORMAL LOW (ref >60)
GFR calc non Af Amer: 30 mL/min — ABNORMAL LOW (ref >60)
GLUCOSE: 115 mg/dL — AB (ref 65–99)
Potassium: 4.9 mmol/L (ref 3.5–5.1)
SODIUM: 124 mmol/L — AB (ref 135–145)
TOTAL PROTEIN: 6.3 g/dL — AB (ref 6.5–8.1)

## 2015-09-29 MED ORDER — SODIUM CHLORIDE 0.9 % IV SOLN
INTRAVENOUS | Status: DC
  Administered 2015-09-29 (×2): via INTRAVENOUS

## 2015-09-29 MED ORDER — CALCIUM CARBONATE ANTACID 500 MG PO CHEW
1.0000 | CHEWABLE_TABLET | Freq: Three times a day (TID) | ORAL | Status: DC | PRN
  Administered 2015-09-30: 200 mg via ORAL
  Filled 2015-09-29 (×2): qty 1

## 2015-09-29 NOTE — Progress Notes (Addendum)
PROGRESS NOTE  Eric Schroeder [id number] DOB: 10/23/1955 DOA: 09/27/2015 PCP: No primary care provider on file.    Assessment/Plan: Principal Problem:   Hyponatremia Active Problems:   Ascites   Biliary obstruction due to malignant neoplasm   Cholangiocarcinoma of biliary tract (HCC)   Encephalopathy acute  metabolic encephalopathy: likely multifactorial from hyponatremia and arf/dehydration Treat underline causes.  Hyponatremia: likely from dehydration, less likely  SIADH as urine sodium less than 10.  -IVF  ARF -improving -IVF  Leukocytosis: no fever, likely from dehydartion  Cholangiocarcinoma with bili drain/elevation of lft,  recent testing of biliary fluid had shown fungual growth. His case one week ago was discussed by the discharging physician with ID physician. He was placed on Augmentin along with Diflucan until Christmas Day, this is continued.  Oncologist Dr. Burr Medico consulted. Large amount of drain output >2liter daily, Interventional radiology consulted for drain management-- capping trial starting 12/17 per IR  Code Status: full  Family Communication: patient   Disposition Plan: home once plan in place with IR and oncology as well as electrolytes corrected   Consultants:  Oncology  Nephrology ( admitting md discussed with nephrology)  IR  Procedures:  none  Antibiotics:  augmentin  diflucan   Objective: BP 112/61 mmHg  Pulse 75  Temp(Src) 97.5 F (36.4 C) (Oral)  Resp 16  Ht 5\' 9"  (1.753 m)  Wt 69.809 kg (153 lb 14.4 oz)  BMI 22.72 kg/m2  SpO2 100%  Intake/Output Summary (Last 24 hours) at 09/29/15 0944 Last data filed at 09/29/15 0800  Gross per 24 hour  Intake   2240 ml  Output   1125 ml  Net   1115 ml   Filed Weights   09/27/15 1711  Weight: 69.809 kg (153 lb 14.4 oz)    Exam:   General:  Thin, NAD  Cardiovascular: RRR  Respiratory: CTABL  Abdomen: Soft/ND/NT, +BS  Musculoskeletal: No Edema  Neuro:  aaox3  Data Reviewed: Basic Metabolic Panel:  Recent Labs Lab 09/23/15 0425 09/27/15 1348 09/28/15 0541 09/28/15 1519 09/29/15 0507  NA 121* 116* 117* 120* 124*  K 4.2 4.5 4.6 4.5 4.9  CL 84* 74* 79* 83* 93*  CO2 30 30 25 26 22   GLUCOSE 120* 99 87 168* 115*  BUN 25* 60* 66* 71* 68*  CREATININE 0.97 2.37* 3.42* 3.55* 2.28*  CALCIUM 8.7* 10.8* 9.7 9.1 8.9   Liver Function Tests:  Recent Labs Lab 09/23/15 0425 09/27/15 1348 09/29/15 0507  AST 68*  67* 151* 111*  ALT 34  35 79* 60  ALKPHOS 119  112 164* 114  BILITOT 3.5*  3.6* 3.5* 2.7*  PROT 7.2  7.1 8.7* 6.3*  ALBUMIN 2.1*  2.0* 2.6* 1.9*    Recent Labs Lab 09/28/15 2030  LIPASE 127*    Recent Labs Lab 09/27/15 1344  AMMONIA 34   CBC:  Recent Labs Lab 09/23/15 0425 09/27/15 1348 09/28/15 0541 09/29/15 0507  WBC 12.2* 17.8* 17.1* 12.4*  NEUTROABS 8.7* 14.1*  --   --   HGB 13.9 16.7 15.2 13.3  HCT 39.2 45.0 41.9 36.8*  MCV 102.6* 99.3 100.2* 100.5*  PLT 149* 164 162 126*   Cardiac Enzymes:   No results for input(s): CKTOTAL, CKMB, CKMBINDEX, TROPONINI in the last 168 hours. BNP (last 3 results) No results for input(s): BNP in the last 8760 hours.  ProBNP (last 3 results) No results for input(s): PROBNP in the last 8760 hours.  CBG: No results for input(s): GLUCAP in  the last 168 hours.  Recent Results (from the past 240 hour(s))  Culture, body fluid-bottle     Status: None   Collection Time: 09/19/15 11:53 AM  Result Value Ref Range Status   Specimen Description FLUID BILE  Final   Special Requests NONE  Final   Gram Stain   Final    GRAM POSITIVE RODS IN BOTH AEROBIC AND ANAEROBIC BOTTLES CRITICAL RESULT CALLED TO, READ BACK BY AND VERIFIED WITH: MEREDITH @0509  09/20/15 MKELLY    Culture   Final    MULTIPLE ORGANISMS PRESENT, NONE PREDOMINANT Performed at [facility]    Report Status 09/22/2015 FINAL  Final  Gram stain     Status: None   Collection Time: 09/19/15  11:53 AM  Result Value Ref Range Status   Specimen Description FLUID BILE  Final   Special Requests NONE  Final   Gram Stain   Final    NO WBC SEEN FEW GRAM POSITIVE RODS RARE GRAM NEGATIVE RODS RARE BUDDING YEAST SEEN Performed at [facility]    Report Status 09/19/2015 FINAL  Final     Studies: US Renal  09/28/2015  CLINICAL DATA:  59 year old male with acute renal failure. EXAM: RENAL / URINARY TRACT ULTRASOUND COMPLETE COMPARISON:  None. FINDINGS: Right Kidney: Length: 11.0 cm. Echogenicity within normal limits. No mass or hydronephrosis visualized. Left Kidney: Length: 12.0 cm. Echogenicity within normal limits. No mass or hydronephrosis visualized. Bladder: Appears normal for degree of bladder distention. IMPRESSION: Unremarkable renal ultrasound. Electronically Signed   By: Margarette Canada M.D.   On: 09/28/2015 12:09    Scheduled Meds: . amoxicillin-clavulanate  500 mg Oral Q12H  . fluconazole  200 mg Oral Daily  . heparin  5,000 Units Subcutaneous 3 times per day  . sodium chloride  3 mL Intravenous Q12H  . sodium chloride  1 g Oral TID WC    Continuous Infusions:     Time spent: 57mins  Jahziah Simonin U Sasha Rogel DO [facility] Pager [phone]. If 7PM-7AM, please contact night-coverage at [email or web address], password [facility] 09/29/2015, 9:44 AM  LOS: 2 days

## 2015-09-30 ENCOUNTER — Other Ambulatory Visit: Payer: Self-pay | Admitting: Radiology

## 2015-09-30 DIAGNOSIS — K831 Obstruction of bile duct: Secondary | ICD-10-CM

## 2015-09-30 LAB — COMPREHENSIVE METABOLIC PANEL
ALT: 58 U/L (ref 17–63)
ANION GAP: 5 (ref 5–15)
AST: 104 U/L — AB (ref 15–41)
Albumin: 1.8 g/dL — ABNORMAL LOW (ref 3.5–5.0)
Alkaline Phosphatase: 130 U/L — ABNORMAL HIGH (ref 38–126)
BUN: 43 mg/dL — ABNORMAL HIGH (ref 6–20)
CHLORIDE: 96 mmol/L — AB (ref 101–111)
CO2: 26 mmol/L (ref 22–32)
Calcium: 8.7 mg/dL — ABNORMAL LOW (ref 8.9–10.3)
Creatinine, Ser: 1.22 mg/dL (ref 0.61–1.24)
Glucose, Bld: 119 mg/dL — ABNORMAL HIGH (ref 65–99)
POTASSIUM: 4.6 mmol/L (ref 3.5–5.1)
SODIUM: 127 mmol/L — AB (ref 135–145)
Total Bilirubin: 2.4 mg/dL — ABNORMAL HIGH (ref 0.3–1.2)
Total Protein: 6.1 g/dL — ABNORMAL LOW (ref 6.5–8.1)

## 2015-09-30 LAB — CBC
HEMATOCRIT: 36.7 % — AB (ref 39.0–52.0)
HEMOGLOBIN: 13.1 g/dL (ref 13.0–17.0)
MCH: 36.2 pg — ABNORMAL HIGH (ref 26.0–34.0)
MCHC: 35.7 g/dL (ref 30.0–36.0)
MCV: 101.4 fL — AB (ref 78.0–100.0)
PLATELETS: 103 10*3/uL — AB (ref 150–400)
RBC: 3.62 MIL/uL — AB (ref 4.22–5.81)
RDW: 13.4 % (ref 11.5–15.5)
WBC: 9.9 10*3/uL (ref 4.0–10.5)

## 2015-09-30 MED ORDER — ZOLPIDEM TARTRATE 5 MG PO TABS
5.0000 mg | ORAL_TABLET | Freq: Once | ORAL | Status: AC
  Administered 2015-09-30: 5 mg via ORAL
  Filled 2015-09-30: qty 1

## 2015-09-30 NOTE — Telephone Encounter (Signed)
Faxed demo/insurance information to [place].

## 2015-09-30 NOTE — Progress Notes (Signed)
PROGRESS NOTE  Eric Schroeder [id number] DOB: [birth year] DOA: 09/27/2015 PCP: No primary care provider on file.    Assessment/Plan: Principal Problem:   Hyponatremia Active Problems:   Ascites   Biliary obstruction due to malignant neoplasm   Cholangiocarcinoma of biliary tract (HCC)   Encephalopathy acute  metabolic encephalopathy: likely multifactorial from hyponatremia and arf/dehydration Treat underline causes.  Hyponatremia: likely from dehydration, less likely  SIADH as urine sodium less than 10.  -IVF  ARF -improving -IVF  Leukocytosis: no fever, likely from dehydartion  Cholangiocarcinoma with bili drain/elevation of lft,  recent testing of biliary fluid had shown fungual growth. His case one week ago was discussed by the discharging physician with ID physician. He was placed on Augmentin along with Diflucan until Christmas Day, this is continued.  Oncologist Dr. Burr Medico consulted. Large amount of drain output >2liter daily, Interventional radiology consulted for drain management-- capping trial starting 12/17 per IR  Code Status: full  Family Communication: patient   Disposition Plan: home once plan in place with IR and oncology as well as electrolytes corrected   Consultants:  Oncology  Nephrology ( admitting md discussed with nephrology)  IR  Procedures:  none  Antibiotics:  augmentin  diflucan  Sub: Some drainage around tube Not painful  Objective: BP 110/69 mmHg  Pulse 75  Temp(Src) 97.4 F (36.3 C) (Oral)  Resp 17  Ht 5\' 9"  (1.753 m)  Wt 69.809 kg (153 lb 14.4 oz)  BMI 22.72 kg/m2  SpO2 100%  Intake/Output Summary (Last 24 hours) at 09/30/15 0947 Last data filed at 09/30/15 0818  Gross per 24 hour  Intake   2700 ml  Output    850 ml  Net   1850 ml   Filed Weights   09/27/15 1711  Weight: 69.809 kg (153 lb 14.4 oz)    Exam:   General:  Thin, NAD  Cardiovascular: RRR  Respiratory: CTABL  Abdomen: Soft/ND/NT,  +BS  Musculoskeletal: No Edema  Neuro: aaox3  Data Reviewed: Basic Metabolic Panel:  Recent Labs Lab 09/27/15 1348 09/28/15 0541 09/28/15 1519 09/29/15 0507 09/30/15 0532  NA 116* 117* 120* 124* 127*  K 4.5 4.6 4.5 4.9 4.6  CL 74* 79* 83* 93* 96*  CO2 30 25 26 22 26   GLUCOSE 99 87 168* 115* 119*  BUN 60* 66* 71* 68* 43*  CREATININE 2.37* 3.42* 3.55* 2.28* 1.22  CALCIUM 10.8* 9.7 9.1 8.9 8.7*   Liver Function Tests:  Recent Labs Lab 09/27/15 1348 09/29/15 0507 09/30/15 0532  AST 151* 111* 104*  ALT 79* 60 58  ALKPHOS 164* 114 130*  BILITOT 3.5* 2.7* 2.4*  PROT 8.7* 6.3* 6.1*  ALBUMIN 2.6* 1.9* 1.8*    Recent Labs Lab 09/28/15 2030  LIPASE 127*    Recent Labs Lab 09/27/15 1344  AMMONIA 34   CBC:  Recent Labs Lab 09/27/15 1348 09/28/15 0541 09/29/15 0507 09/30/15 0532  WBC 17.8* 17.1* 12.4* 9.9  NEUTROABS 14.1*  --   --   --   HGB 16.7 15.2 13.3 13.1  HCT 45.0 41.9 36.8* 36.7*  MCV 99.3 100.2* 100.5* 101.4*  PLT 164 162 126* 103*   Cardiac Enzymes:   No results for input(s): CKTOTAL, CKMB, CKMBINDEX, TROPONINI in the last 168 hours. BNP (last 3 results) No results for input(s): BNP in the last 8760 hours.  ProBNP (last 3 results) No results for input(s): PROBNP in the last 8760 hours.  CBG: No results for input(s): GLUCAP in the last  168 hours.  Recent Results (from the past 240 hour(s))  Urine culture     Status: None   Collection Time: 09/28/15  7:32 AM  Result Value Ref Range Status   Specimen Description URINE, CLEAN CATCH  Final   Special Requests NONE  Final   Culture   Final    NO GROWTH 1 DAY Performed at [facility]    Report Status 09/29/2015 FINAL  Final     Studies: No results found.  Scheduled Meds: . amoxicillin-clavulanate  500 mg Oral Q12H  . fluconazole  200 mg Oral Daily  . heparin  5,000 Units Subcutaneous 3 times per day  . sodium chloride  3 mL Intravenous Q12H  . sodium chloride  1 g Oral TID  WC    Continuous Infusions: . sodium chloride 125 mL/hr at 09/29/15 2319     Time spent: 35mins  Eric Schroeder Eric Alatorre DO [facility] Pager [phone]. If 7PM-7AM, please contact night-coverage at [email or web address], password [facility] 09/30/2015, 9:47 AM  LOS: 3 days

## 2015-09-30 NOTE — Care Management Note (Signed)
Case Management Note  Patient Details  Name: Eric Schroeder MRN: [id number] Date of Birth: 30-Jun-1956  Subjective/Objective:                hypotensive    Action/Plan:Date: September 30, 2015 Chart reviewed for concurrent status and case management needs. Will continue to follow patient for changes and needs: Velva Harman, RN, BSN, [place]   [phone]   Expected Discharge Date:   (unknown)               Expected Discharge Plan:  Home/Self Care  In-House Referral:  NA  Discharge planning Services  CM Consult  Post Acute Care Choice:  NA Choice offered to:  NA  DME Arranged:    DME Agency:     HH Arranged:    HH Agency:     Status of Service:  In process, will continue to follow  Medicare Important Message Given:    Date Medicare IM Given:    Medicare IM give by:    Date Additional Medicare IM Given:    Additional Medicare Important Message give by:     If discussed at [place] of Stay Meetings, dates discussed:    Additional Comments:  Leeroy Cha, RN 09/30/2015, 10:50 AM

## 2015-09-30 NOTE — Progress Notes (Signed)
Subjective: Patient feels good today without complaints. He denies any abdominal pain or fevers.   Allergies: Review of patient's allergies indicates no known allergies.  Medications: Prior to Admission medications   Medication Sig Start Date End Date Taking? Authorizing Provider  acetaminophen (TYLENOL) 500 MG tablet Take 500 mg by mouth every 6 (six) hours as needed for moderate pain.   Yes Historical Provider, MD  amoxicillin-clavulanate (AUGMENTIN) 875-125 MG tablet Take 1 tablet by mouth every 12 (twelve) hours. Patient taking differently: Take 1 tablet by mouth every 12 (twelve) hours. For 5 days 12-12 to 12-16 09/23/15  Yes Nita Sells, MD  fluconazole (DIFLUCAN) 200 MG tablet Take 1 tablet (200 mg total) by mouth daily. 09/23/15  Yes Nita Sells, MD  sodium chloride 1 G tablet Take 1 tablet (1 g total) by mouth 2 (two) times daily with a meal. 09/23/15  Yes Nita Sells, MD  oxyCODONE (OXYCONTIN) 10 mg 12 hr tablet Take 1 tablet (10 mg total) by mouth every 12 (twelve) hours. Patient not taking: Reported on 09/27/2015 09/23/15   Nita Sells, MD   Vital Signs: BP 108/71 mmHg  Pulse 74  Temp(Src) 97.7 F (36.5 C) (Oral)  Resp 18  Ht 5\' 9"  (1.753 m)  Wt 153 lb 14.4 oz (69.809 kg)  BMI 22.72 kg/m2  SpO2 100%  Physical Exam General: A&Ox3, NAD Abd: Soft, NT, biliary drain capped- NT  Imaging: Dg Lumbar Spine Complete  09/27/2015  CLINICAL DATA:  Recent fall with low back pain, initial encounter EXAM: LUMBAR SPINE - COMPLETE 4+ VIEW COMPARISON:  None. FINDINGS: Biliary drainage catheter is noted. Five lumbar type vertebral bodies are well visualized. Mild osteophytic changes are seen. No spondylolisthesis is noted. No compression deformity is noted. No gross soft tissue abnormality is seen. IMPRESSION: Mild degenerative change without acute abnormality. Electronically Signed   By: Inez Catalina M.D.   On: 09/27/2015 14:16   Ct Head Wo  Contrast  09/27/2015  CLINICAL DATA:  Balance disorder ; right foot drop. Fall. History of cholangiocarcinoma. EXAM: CT HEAD WITHOUT CONTRAST TECHNIQUE: Contiguous axial images were obtained from the base of the skull through the vertex without intravenous contrast. COMPARISON:  None. FINDINGS: There is mild diffuse atrophy. There is mild invagination of CSF into the sella. There is no intracranial mass, hemorrhage, extra-axial fluid collection, or midline shift. Gray-white compartments are normal. No acute infarct evident. The bony calvarium appears intact. The mastoid air cells are clear. No intraorbital lesions are identified. IMPRESSION: Mild atrophy. Mild degree of empty sella. No intracranial mass, hemorrhage, or extra-axial fluid collection. No focal gray-white compartment lesions are identified. Electronically Signed   By: Lowella Grip III M.D.   On: 09/27/2015 14:17   US Renal  09/28/2015  CLINICAL DATA:  59 year old male with acute renal failure. EXAM: RENAL / URINARY TRACT ULTRASOUND COMPLETE COMPARISON:  None. FINDINGS: Right Kidney: Length: 11.0 cm. Echogenicity within normal limits. No mass or hydronephrosis visualized. Left Kidney: Length: 12.0 cm. Echogenicity within normal limits. No mass or hydronephrosis visualized. Bladder: Appears normal for degree of bladder distention. IMPRESSION: Unremarkable renal ultrasound. Electronically Signed   By: Margarette Canada M.D.   On: 09/28/2015 12:09    Labs:  CBC:  Recent Labs  09/27/15 1348 09/28/15 0541 09/29/15 0507 09/30/15 0532  WBC 17.8* 17.1* 12.4* 9.9  HGB 16.7 15.2 13.3 13.1  HCT 45.0 41.9 36.8* 36.7*  PLT 164 162 126* 103*    COAGS:  Recent Labs  09/15/15 1208  09/19/15 0355 09/20/15 0530 09/22/15 0415 09/23/15 0425  INR 1.57*  < > 1.68* 1.64* 1.64* 1.67*  APTT 38*  --   --   --   --   --   < > = values in this interval not displayed.  BMP:  Recent Labs  09/28/15 0541 09/28/15 1519 09/29/15 0507  09/30/15 0532  NA 117* 120* 124* 127*  K 4.6 4.5 4.9 4.6  CL 79* 83* 93* 96*  CO2 25 26 22 26   GLUCOSE 87 168* 115* 119*  BUN 66* 71* 68* 43*  CALCIUM 9.7 9.1 8.9 8.7*  CREATININE 3.42* 3.55* 2.28* 1.22  GFRNONAA 18* 18* 30* >60  GFRAA 21* 20* 35* >60    LIVER FUNCTION TESTS:  Recent Labs  09/23/15 0425 09/27/15 1348 09/29/15 0507 09/30/15 0532  BILITOT 3.5*  3.6* 3.5* 2.7* 2.4*  AST 68*  67* 151* 111* 104*  ALT 34  35 79* 60 58  ALKPHOS 119  112 164* 114 130*  PROT 7.2  7.1 8.7* 6.3* 6.1*  ALBUMIN 2.1*  2.0* 2.6* 1.9* 1.8*    Assessment and Plan: Biliary obstruction  S/p internal/external biliary drain placed 09/16/15 S/p FNA biliary/hepatic tumor revealed adenocarcinoma 12/9 S/p cholangiogram and exchange of internal/external biliary drain with placement of new 10 F drain terminating in duodenum 09/20/15 Admitted 12/16 with dehydration/hyponatremia/ARF and over 2L of bilious output via drain S/p capping trial starting 12/17, T. Bili and LFT's stable, no abdominal pain or fevers  Discussed internalization/stent with Dr. Anselm Pancoast- recommend continue capped catheter until treatment plan more definitive Per patient awaiting treatment until he has second surgical opinion at [facility], Dr. Burr Medico following  Recommended once daily flushing of catheter 5-10 ml sterile saline, will schedule for 4-6 week exchange and patient given drainage bag in case of worsening abdominal pain- he was instructed to call us with any symptoms prior to drainage.    SignedHedy Jacob 09/30/2015, 4:30 PM   I spent a total of 15 Minutes at the the patient's bedside AND on the patient's hospital floor or unit, greater than 50% of which was counseling/coordinating care for biliary obstruction.

## 2015-10-01 ENCOUNTER — Other Ambulatory Visit: Payer: Self-pay | Admitting: Hematology

## 2015-10-01 ENCOUNTER — Telehealth: Payer: Self-pay | Admitting: Hematology

## 2015-10-01 DIAGNOSIS — E46 Unspecified protein-calorie malnutrition: Secondary | ICD-10-CM

## 2015-10-01 DIAGNOSIS — R740 Nonspecific elevation of levels of transaminase and lactic acid dehydrogenase [LDH]: Secondary | ICD-10-CM

## 2015-10-01 LAB — COMPREHENSIVE METABOLIC PANEL
ALBUMIN: 1.8 g/dL — AB (ref 3.5–5.0)
ALT: 54 U/L (ref 17–63)
AST: 91 U/L — AB (ref 15–41)
Alkaline Phosphatase: 132 U/L — ABNORMAL HIGH (ref 38–126)
Anion gap: 4 — ABNORMAL LOW (ref 5–15)
BILIRUBIN TOTAL: 2.2 mg/dL — AB (ref 0.3–1.2)
BUN: 27 mg/dL — AB (ref 6–20)
CHLORIDE: 102 mmol/L (ref 101–111)
CO2: 25 mmol/L (ref 22–32)
CREATININE: 0.88 mg/dL (ref 0.61–1.24)
Calcium: 8.9 mg/dL (ref 8.9–10.3)
GFR calc Af Amer: >60 mL/min (ref >60)
GLUCOSE: 177 mg/dL — AB (ref 65–99)
POTASSIUM: 4.4 mmol/L (ref 3.5–5.1)
Sodium: 131 mmol/L — ABNORMAL LOW (ref 135–145)
Total Protein: 6 g/dL — ABNORMAL LOW (ref 6.5–8.1)

## 2015-10-01 MED ORDER — AMOXICILLIN-POT CLAVULANATE 875-125 MG PO TABS: 1.0000 | ORAL_TABLET | Freq: Two times a day (BID) | ORAL | Status: AC

## 2015-10-01 MED ORDER — SODIUM CHLORIDE 0.9 % IJ SOLN: 10.0000 mL | Freq: Every day | INTRAMUSCULAR | Status: AC

## 2015-10-01 NOTE — Telephone Encounter (Signed)
cld pt and left message of time & date of appt for 12/27 & 1/6

## 2015-10-01 NOTE — Discharge Summary (Signed)
Physician Discharge Summary  Eric Schroeder [id number] DOB: [birth year] DOA: 09/27/2015  PCP: No primary care provider on file.  Admit date: 09/27/2015 Discharge date: 10/01/2015  Time spent: 35 minutes  Recommendations for Outpatient Follow-up:  1. Follow up with IR 2. Follow up with oncology- Monday Dec 26   Discharge Diagnoses:  Principal Problem:   Hyponatremia Active Problems:   Ascites   Biliary obstruction due to malignant neoplasm   Cholangiocarcinoma of biliary tract (HCC)   Encephalopathy acute   Discharge Condition: improved  Diet recommendation: regular  Filed Weights   09/27/15 1711  Weight: 69.809 kg (153 lb 14.4 oz)    History of present illness:  Eric Schroeder is a 59 y.o. male, with recently diagnosed cholangiocarcinoma under the care of Dr. Annamaria Boots, who is recently diagnosed from this hospital after the new diagnosis of cholangiocarcinoma, he underwent a bili drain placement, he was found to be losing a lot of fluid in his bili drain up to 1-1/2-2 L, he subsequently came dehydrated and was hyponatremic till about 4 days ago when his BMP was checked last.  He was to follow with his oncologist Dr. Annamaria Boots today however at home he started to experience gradually progressive weakness, dizziness and had a fall after he got lightheaded at home, he was also seen to be mildly confused, his girlfriend brought him to the ER where he was found to be mildly encephalopathic with severe hyponatremia and dehydration along with acute renal failure. I was called to admit the patient.  He denies any headache, no fever or chills, no focal deficits, no chest pain cough and shortness of breath, no abdominal pain, no blood in stool or urine.  Hospital Course:  metabolic encephalopathy: likely multifactorial from hyponatremia and arf/dehydration Treat underline causes.  Hyponatremia: likely from dehydration, less likely SIADH as urine sodium less than 10.   -IVF  ARF -improving -IVF  Leukocytosis: no fever, likely from dehydartion  Cholangiocarcinoma with bili drain/elevation of lft,  recent testing of biliary fluid had shown fungual growth. His case one week ago was discussed by the discharging physician with ID physician. He was placed on Augmentin along with Diflucan until Christmas Day, this is continued.  Oncologist Dr. Burr Medico consulted will see on 12/26 in office Large amount of drain output >2liter daily, Interventional radiology consulted for drain management-- capping trial started 12/17 per IR and patient doing well   Procedures:  Capping trial  Consultations:  IR  oncology  Discharge Exam: Filed Vitals:   09/30/15 2207 10/01/15 0650  BP: 107/69 112/69  Pulse: 80 79  Temp: 98.2 F (36.8 C) 97.6 F (36.4 C)  Resp: 18 18     Discharge Instructions   Discharge Instructions    Diet - low sodium heart healthy    Complete by:  As directed      Discharge instructions    Complete by:  As directed   Drain capped Diflucan/augmentin through 12/25 Recommended once daily flushing of catheter 5-10 ml sterile saline IR will schedule for 4-6 week exchange and patient given drainage bag in case of worsening abdominal pain- patient instructed to call IR with any symptoms     Increase activity slowly    Complete by:  As directed           Current Discharge Medication List    CONTINUE these medications which have CHANGED   Details  amoxicillin-clavulanate (AUGMENTIN) 875-125 MG tablet Take 1 tablet by mouth every 12 (twelve) hours. Qty: 10  tablet, Refills: 0      CONTINUE these medications which have NOT CHANGED   Details  acetaminophen (TYLENOL) 500 MG tablet Take 500 mg by mouth every 6 (six) hours as needed for moderate pain.    fluconazole (DIFLUCAN) 200 MG tablet Take 1 tablet (200 mg total) by mouth daily. Qty: 9 tablet, Refills: 0    sodium chloride 1 G tablet Take 1 tablet (1 g total) by mouth 2 (two)  times daily with a meal. Qty: 40 tablet, Refills: 0      STOP taking these medications     oxyCODONE (OXYCONTIN) 10 mg 12 hr tablet        No Known Allergies Follow-up Information    Follow up with Please use the resources provided to you in emergency room by case manager to assist with doctor for follow up . Schedule an appointment as soon as possible for a visit on 10/08/2015.   Contact information:   These [place] uninsured resources provide possible primary care providers, resources for discounted medications, housing, dental resources, affordable care act information, plus other resources for [facility]        Follow up with [name], MD.   Specialties:  Hematology, Oncology   Why:  appointment on 12/26   Contact information:   [place]  [id number] [phone]        The results of significant diagnostics from this hospitalization (including imaging, microbiology, ancillary and laboratory) are listed below for reference.    Significant Diagnostic Studies: Dg Chest 2 View  09/14/2015  CLINICAL DATA:  Bilateral ankle swelling. Abdominal distention. Back pain. Shallow breathing. EXAM: CHEST  2 VIEW COMPARISON:  None. FINDINGS: Poor inspiration. Normal sized heart. Minimal left lateral pleural thickening. No pleural fluid is seen posteriorly on either side. Small amount of linear density at the left lung base. Mild thoracic spine degenerative changes. IMPRESSION: Poor inspiration with a small amount of pleural and parenchymal scarring at the left lung base. Electronically Signed   By: Claudie Revering M.D.   On: 09/14/2015 19:20   Dg Lumbar Spine Complete  09/27/2015  CLINICAL DATA:  Recent fall with low back pain, initial encounter EXAM: LUMBAR SPINE - COMPLETE 4+ VIEW COMPARISON:  None. FINDINGS: Biliary drainage catheter is noted. Five lumbar type vertebral bodies are well visualized. Mild osteophytic changes are seen. No spondylolisthesis is noted. No  compression deformity is noted. No gross soft tissue abnormality is seen. IMPRESSION: Mild degenerative change without acute abnormality. Electronically Signed   By: Inez Catalina M.D.   On: 09/27/2015 14:16   Ct Head Wo Contrast  09/27/2015  CLINICAL DATA:  Balance disorder ; right foot drop. Fall. History of cholangiocarcinoma. EXAM: CT HEAD WITHOUT CONTRAST TECHNIQUE: Contiguous axial images were obtained from the base of the skull through the vertex without intravenous contrast. COMPARISON:  None. FINDINGS: There is mild diffuse atrophy. There is mild invagination of CSF into the sella. There is no intracranial mass, hemorrhage, extra-axial fluid collection, or midline shift. Gray-white compartments are normal. No acute infarct evident. The bony calvarium appears intact. The mastoid air cells are clear. No intraorbital lesions are identified. IMPRESSION: Mild atrophy. Mild degree of empty sella. No intracranial mass, hemorrhage, or extra-axial fluid collection. No focal gray-white compartment lesions are identified. Electronically Signed   By: Lowella Grip III M.D.   On: 09/27/2015 14:17   Ct Chest W Contrast  09/15/2015  CLINICAL DATA:  Liver mass diagnosed on CT 1  day prior. Status post paracentesis. Fatigue. Inpatient. EXAM: CT CHEST WITH CONTRAST TECHNIQUE: Multidetector CT imaging of the chest was performed during intravenous contrast administration. CONTRAST:  49mL OMNIPAQUE IOHEXOL 300 MG/ML  SOLN COMPARISON:  09/14/2015 CT abdomen/ pelvis. Chest radiograph from 1 day prior. FINDINGS: Mediastinum/Nodes: Normal heart size. No pericardial fluid/thickening. Left anterior descending and left circumflex coronary atherosclerosis. Great vessels are normal in course and caliber. No central pulmonary emboli. Normal visualized thyroid. Large lower esophageal varices. No pathologically enlarged axillary, mediastinal or hilar lymph nodes. Lungs/Pleura: No pneumothorax. Trace left pleural effusion.  Subsegmental atelectasis in both lower lobes. No acute consolidative airspace disease, significant pulmonary nodules or lung masses. Upper abdomen: Re- demonstrated is cirrhosis and diffuse severe intrahepatic biliary ductal dilatation, with re- demonstration of central liver 4.7 cm mass extending superiorly and inferiorly along the central bile ducts. There are stable subcentimeter hypodense lesions in the left liver lobe and simple cysts in the right liver lobe. Small volume upper abdominal ascites, decreased. Fat stranding throughout the upper abdominal mesenteric and omental fat, unchanged. Stable dilated visualized proximal common bile duct. Stable mild upper retroperitoneal adenopathy. Musculoskeletal: Stable nondisplaced healing subacute lateral left eighth rib fracture. Mild-to-moderate degenerative changes in thoracic spine. IMPRESSION: 1. No evidence of metastatic disease in the chest. 2. Trace left pleural effusion. Mild bibasilar atelectasis. No acute consolidative airspace disease. 3. Three-vessel coronary atherosclerosis. 4. Stable nondisplaced healing subacute lateral left eighth rib fracture. 5. Re- demonstration of cirrhosis, central liver mass, severe diffuse intrahepatic biliary ductal dilatation, indeterminate subcentimeter hypodense left liver lobe lesions. Small volume upper abdominal ascites, decreased. Electronically Signed   By: Ilona Sorrel M.D.   On: 09/15/2015 13:47   Ct Abdomen Pelvis W Contrast  09/14/2015  CLINICAL DATA:  Abdominal distention. Back pain. Lower extremity swelling. EXAM: CT ABDOMEN AND PELVIS WITH CONTRAST TECHNIQUE: Multidetector CT imaging of the abdomen and pelvis was performed using the standard protocol following bolus administration of intravenous contrast. CONTRAST:  157mL OMNIPAQUE IOHEXOL 300 MG/ML  SOLN COMPARISON:  None. FINDINGS: Lower chest: No significant pulmonary nodules or acute consolidative airspace disease. Subsegmental atelectasis in the basilar  lower lobes. Hepatobiliary: The liver surface is diffusely prominently irregular, and there is relative hypertrophy of the left liver lobe, in keeping with cirrhosis. There is severe diffuse intrahepatic biliary ductal dilatation. There is a lobulated 4.8 x 3.5 cm mass in the porta hepatis (series 2/ image 25), which demonstrates hypoenhancement relative to the liver parenchyma, and which demonstrates finger-like extensions into the right liver lobe along the biliary tree. 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 Simple appearing 1.9 cm liver cyst in the posterior right liver lobe. Separate simple appearing 3.4 cm liver cyst in the posterior upper right liver lobe. There are at least 2 additional subcentimeter hypodense lesions in the left liver lobe, too small to characterize. Nondistended gallbladder with nonspecific mild diffuse gallbladder wall thickening. The common bile duct is dilated up to 14 mm in diameter proximally with smooth distal tapering. Pancreas: Normal, with no mass or duct dilation. Spleen: Normal size. No mass. Adrenals/Urinary Tract: Normal adrenals. Normal kidneys with no hydronephrosis and no renal mass. Normal bladder. Stomach/Bowel: Grossly normal stomach. Normal caliber small bowel with no small bowel wall thickening. Normal appendix. Normal large bowel with no diverticulosis, large bowel wall thickening or pericolonic fat stranding. Vascular/Lymphatic: Normal caliber abdominal aorta. Patent portal, splenic and renal veins. There are large esophageal varices. There are small paraumbilical varices. There are additional portosystemic  varices in the left omentum, which appear to drain into the left pelvic deep veins. There are multiple mildly enlarged porta hepatis nodes, largest 1.4 cm (series 2/ image 30). Reproductive: Normal size prostate with nonspecific internal prostatic calcifications. Other: No pneumoperitoneum. Moderate to large volume ascites,  which measures simple fluid density. Nonspecific fat stranding throughout the greater omentum and mesentery. Musculoskeletal: No aggressive appearing focal osseous lesions. Subacute healing lateral left eighth rib fracture. Mild-to-moderate degenerative changes in the visualized thoracolumbar spine. IMPRESSION: 1. Cirrhosis. Lobulated 4.8 x 3.5 cm solid mass in the porta hepatis with finger-like extensions into the right liver lobe along the biliary tree. Severe diffuse intrahepatic biliary ductal dilatation. 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 MRI abdomen with and without intravenous contrast could be performed after paracentesis for further evaluation. Recommend GI consultation for ERCP and possible biliary decompression, which may require IR consultation for percutaneous biliary decompression. 2. Large volume ascites. 3. Large esophageal varices. Small paraumbilical varices. Additional portosystemic left omental varices. 4. Nonspecific mild porta hepatis lymphadenopathy. 5. Separate subcentimeter hypodense liver lesions are too small to characterize and could represent liver metastases. 6. Subacute lateral left eighth rib fracture. These results were called by telephone at the time of interpretation on 09/14/2015 at 7:38 pm to Dr. Lonia Skinner , who verbally acknowledged these results. Electronically Signed   By: Ilona Sorrel M.D.   On: 09/14/2015 19:40   US Renal  09/28/2015  CLINICAL DATA:  59 year old male with acute renal failure. EXAM: RENAL / URINARY TRACT ULTRASOUND COMPLETE COMPARISON:  None. FINDINGS: Right Kidney: Length: 11.0 cm. Echogenicity within normal limits. No mass or hydronephrosis visualized. Left Kidney: Length: 12.0 cm. Echogenicity within normal limits. No mass or hydronephrosis visualized. Bladder: Appears normal for degree of bladder distention. IMPRESSION: Unremarkable renal  ultrasound. Electronically Signed   By: Margarette Canada M.D.   On: 09/28/2015 12:09   Mr 3d Recon At Scanner  09/15/2015  CLINICAL DATA:  Cirrhosis with ascites, distal esophageal varices and lobulated mass in the porta hepatis causing biliary obstruction on CT. EXAM: MRI ABDOMEN WITHOUT AND WITH CONTRAST (INCLUDING MRCP) TECHNIQUE: Multiplanar multisequence MR imaging of the abdomen was performed both before and after the administration of intravenous contrast. Heavily T2-weighted images of the biliary and pancreatic ducts were obtained, and three-dimensional MRCP images were rendered by post processing. CONTRAST:  11mL MULTIHANCE GADOBENATE DIMEGLUMINE 529 MG/ML IV SOLN COMPARISON:  Abdominal CT 09/14/2015. FINDINGS: Unfortunately, the study is mildly motion degraded due to the patient's inability to completely suspend respiration. Lower chest:  Small left-greater-than-right pleural effusions. Hepatobiliary: The liver contours are diffusely irregular consistent with cirrhosis. There are multiple siderotic nodules throughout the liver with diffuse suppression of hepatic signal on the in phase images consistent with hemosiderosis. There is a 1.6 cm lesion in the medial segment of the left hepatic lobe which demonstrates T1 shortening and low level enhancement following contrast. No other enhancing intrahepatic lesions are identified. There are several hepatic cysts, largest projecting from the dome of the right hepatic lobe, measuring 3.5 cm. As demonstrated on CT, there is a tubular mass within the porta hepatis which appears intra biliary. This demonstrates intermediate T2 signal, low-level enhancement and measures up to 3.8 cm transverse and 6.3 cm in length. There is resulting marked intrahepatic biliary dilatation with the left hepatic duct measuring up to 1.7 cm. The distal common bile duct is normal in caliber. Pancreas: Mildly atrophied. No  evidence of pancreatic mass or pancreatic ductal dilatation. Spleen:  Normal in size without focal abnormality. Adrenals/Urinary Tract: Both adrenal glands appear normal. Small renal cysts are noted. There is no suspicious renal finding or hydronephrosis. Stomach/Bowel: No evidence of bowel wall thickening, distention or surrounding inflammatory change. Vascular/Lymphatic: Multiple mildly enlarged lymph nodes within the porta hepatis are again noted. 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 Other: Ascites has decreased in volume. No suspicious peritoneal enhancement. Musculoskeletal: No acute or significant osseous findings. IMPRESSION: 1. 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 Tissue sampling and biliary decompression recommended. 2. Prominent nonspecific lymph nodes in the porta hepatis, likely related to chronic liver disease. No definite distant metastases. 3. Macronodular cirrhosis with multiple siderotic nodules. There is one T1 hyperintense nodule in the left lobe which demonstrates low level enhancement and could reflect a dysplastic nodule. 4. Decreased volume of ascites following paracentesis. Electronically Signed   By: Richardean Sale M.D.   On: 09/15/2015 13:52   US Paracentesis  09/15/2015  Ardis Rowan, PA-C     09/15/2015 11:37 AM Successful US guided paracentesis from LLQ. Yielded 6 liters of clear yellow fluid. No immediate complications. Pt tolerated well. Specimen was sent for labs. WENDY S BLAIR PA-C 09/15/2015 11:37 AM   Ir Fluoro Guide Ndl Plmt / Bx  09/20/2015  CLINICAL DATA:  59 year old male with a history of hyperbilirubinemia and biliary obstruction. 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 The patient has undergone a left-sided percutaneous transhepatic cholangiogram and drainage performed 09/16/2015. He has not had normalization of  his hyperbilirubinemia. He presents today for a through the tube cholangiogram and a biopsy EXAM: FLUOROSCOPIC GUIDED FINE NEEDLE ASPIRATION OF LIVER TUMOR/BILIARY TUMOR THROUGH THE TUBE CHOLANGIOGRAM EXCHANGE OF INDWELLING LEFT-SIDED BILIARY INTERNAL EXTERNAL DRAIN FLUOROSCOPY TIME:  7 minutes 30 seconds MEDICATIONS AND MEDICAL HISTORY: None ANESTHESIA/SEDATION: 1.5 mg Versed, 75 mcg fentanyl 55 minutes CONTRAST:  76mL OMNIPAQUE IOHEXOL 300 MG/ML  SOLN COMPLICATIONS: None PROCEDURE: Informed consent was obtained from the patient following explanation of the procedure, risks, benefits and alternatives. The patient understands, agrees and consents for the procedure. All questions were addressed. A time out was performed. Maximal barrier sterile technique utilized including caps, mask, sterile gowns, sterile gloves, large sterile drape, hand hygiene, and 1% lidocaine was used for local anesthesia. The patient is positioned supine position on the IR table and the upper abdomen was prepped and draped in the usual sterile fashion. Scout images of the indwelling biliary tube were acquired and were used as a target for fine-needle aspiration of the soft tissues in the adjacent liver and biliary system. Three separate 21 gauge fine-needle Chiba were used for fine-needle aspirations under fluoroscopic guidance. The samples were passed to a cytotechnologist in the room. Initial quick stain was negative, and 3 separate fine-needle aspirations were acquired under fluoroscopy. A Bentson wire was advanced through the indwelling tube into the duodenum, and the tube was removed. An 8 French sheath was advanced over the wire into the biliary system. The dilator was removed and a through the tube cholangiogram was performed with multiple obliquities. A new 10 French internal-external biliary tube was then placed. Final image was stored. Patient tolerated the procedure well and remained hemodynamically stable throughout. No  complications were encountered and no significant blood loss was encountered. FINDINGS: Through the tube  cholangiogram demonstrates opacification of the majority of the right-sided ductal system, which is in communication with the left-sided ductal system. There is distortion of the ducts at the confluence of the left and the right sided ducts, with a few ducts of segment 6 not opacified, potentially obstructed by the tumor. Significant common bile duct and common hepatic duct dilation, with a tubular filling defect nearly entirely filling the common hepatic duct and common bile duct to the level of the ampulla. Final image demonstrates placement of a new 22 [place] internal external biliary tube terminating in the duodenum, with the most proximal side hole within the left-sided ductal system. 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 Tissue specimen sent to pathology for complete histopathologic analysis. Status post through the tube cholangiogram with confirmation of communication of the majority of the right-sided hepatic ducts with the left-sided ducts which remain drained. Status post exchange of internal/ external biliary drain with placement of a new 10 French drain terminating in the duodenum. Signed, Dulcy Fanny. Earleen Newport, DO [facility]iology Electronically Signed   By: Corrie Mckusick D.O.   On: 09/20/2015 14:21   Mr Abd W/wo Cm/mrcp  09/15/2015  CLINICAL DATA:  Cirrhosis with ascites, distal esophageal varices and lobulated mass in the porta hepatis causing biliary obstruction on CT. EXAM: MRI ABDOMEN WITHOUT AND WITH CONTRAST (INCLUDING MRCP) TECHNIQUE: Multiplanar multisequence MR imaging of the abdomen was performed both before and after the administration of intravenous contrast. Heavily T2-weighted images of the biliary and pancreatic ducts were obtained, and three-dimensional  MRCP images were rendered by post processing. CONTRAST:  15mL MULTIHANCE GADOBENATE DIMEGLUMINE 529 MG/ML IV SOLN COMPARISON:  Abdominal CT 09/14/2015. FINDINGS: Unfortunately, the study is mildly motion degraded due to the patient's inability to completely suspend respiration. Lower chest:  Small left-greater-than-right pleural effusions. Hepatobiliary: The liver contours are diffusely irregular consistent with cirrhosis. There are multiple siderotic nodules throughout the liver with diffuse suppression of hepatic signal on the in phase images consistent with hemosiderosis. There is a 1.6 cm lesion in the medial segment of the left hepatic lobe which demonstrates T1 shortening and low level enhancement following contrast. No other enhancing intrahepatic lesions are identified. There are several hepatic cysts, largest projecting from the dome of the right hepatic lobe, measuring 3.5 cm. As demonstrated on CT, there is a tubular mass within the porta hepatis which appears intra biliary. This demonstrates intermediate T2 signal, low-level enhancement and measures up to 3.8 cm transverse and 6.3 cm in length. There is resulting marked intrahepatic biliary dilatation with the left hepatic duct measuring up to 1.7 cm. The distal common bile duct is normal in caliber. Pancreas: Mildly atrophied. No evidence of pancreatic mass or pancreatic ductal dilatation. Spleen: Normal in size without focal abnormality. Adrenals/Urinary Tract: Both adrenal glands appear normal. Small renal cysts are noted. There is no suspicious renal finding or hydronephrosis. Stomach/Bowel: No evidence of bowel wall thickening, distention or surrounding inflammatory change. Vascular/Lymphatic: Multiple mildly enlarged lymph nodes within the porta hepatis are again noted. 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 Other: Ascites has decreased in volume. No suspicious peritoneal  enhancement. Musculoskeletal: No acute or significant osseous findings. IMPRESSION: 1. 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 Tissue sampling and biliary decompression recommended. 2. Prominent nonspecific lymph nodes in the porta  hepatis, likely related to chronic liver disease. No definite distant metastases. 3. Macronodular cirrhosis with multiple siderotic nodules. There is one T1 hyperintense nodule in the left lobe which demonstrates low level enhancement and could reflect a dysplastic nodule. 4. Decreased volume of ascites following paracentesis. Electronically Signed   By: Richardean Sale M.D.   On: 09/15/2015 13:52   Ir Int Lianne Cure Biliary Drain With Cholangiogram  09/17/2015  CLINICAL DATA:  Biliary obstruction EXAM: IR INT-EXT BILIARY DRAIN W/ CHOLANGIOGRAM; IR BALLOON DILATION OF BILIARY DUCT/AMPULLA FLUOROSCOPY TIME:  9 minutes and 6 seconds MEDICATIONS AND MEDICAL HISTORY: Versed 4 mg, Fentanyl 100 mcg. Additional Medications: None.  Patient was on Rocephin. ANESTHESIA/SEDATION: Moderate sedation time: 60 minutes CONTRAST:  15 cc Omnipaque 300 PROCEDURE: The procedure, risks, benefits, and alternatives were explained to the patient. Questions regarding the procedure were encouraged and answered. The patient understands and consents to the procedure. The upper abdominal region was prepped with Betadine in a sterile fashion, and a sterile drape was applied covering the operative field. A sterile gown and sterile gloves were used for the procedure. Under sonographic guidance, a Chiba needle was inserted into a left hepatic duct. Contrast was injected opacifying the biliary tree. The needle was removed over a 018 wire. The Accustick transitional dilator was advanced over the wire to the mid biliary tree. It could not be advanced into the central biliary tree secondary to a very resistant partial  obstruction. An Amplatz wire was advanced through the transitional dilator. This was advanced into the central biliary tree. Again, the 6 [place] transitional dilator of the Accustick set could not be advanced across the Amplatz wire. A 4 French glide catheter was advanced across the Amplatz wire into the central biliary tree. The glide catheter was advanced over a Bentson wire into the duodenum then was again exchange for the Amplatz wire. Initial attempts at dilating across the partial obstruction with serial dilators starting from 5 [place] were unsuccessful. A 5 French sheath was advanced over the Amplatz into the peripheral biliary tree a 4 French balloon was utilized to dilate the partial obstruction. The tract was then easily dilated to 8 [place] an 8 [place] biliary drain was then advanced over the Amplatz into the duodenum. Two additional sideholes were cut above the upper marker. It was looped and string fixed in the duodenum then sewn to the skin. The initial aspirate was sent for a cytology analysis. Contrast was injected. FINDINGS: Percutaneous cholangiography into the left the biliary tree demonstrates very limited opacification the the bile ducts. This was performed in an attempt to prevent sepsis. Final images demonstrate left internal external biliary drain placement with its tip coiled in the duodenum. Central left-sided ducts only were opacified by contrast. COMPLICATIONS: None IMPRESSION: Successful left internal external biliary drain placement. An 8 French device was placed secondary to a resistant biliary obstruction. Initial bile aspirate was sent for cytology. Electronically Signed   By: Marybelle Killings M.D.   On: 09/17/2015 08:33   Ir Exchange Biliary Drain  09/20/2015  CLINICAL DATA:  59 year old male with a history of hyperbilirubinemia and biliary obstruction. 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 The patient has undergone a left-sided  percutaneous transhepatic cholangiogram and drainage performed 09/16/2015. He has not had normalization of his hyperbilirubinemia. He presents today for a through the tube cholangiogram and a biopsy EXAM: FLUOROSCOPIC GUIDED FINE NEEDLE ASPIRATION OF LIVER TUMOR/BILIARY TUMOR THROUGH THE TUBE CHOLANGIOGRAM EXCHANGE  OF INDWELLING LEFT-SIDED BILIARY INTERNAL EXTERNAL DRAIN FLUOROSCOPY TIME:  7 minutes 30 seconds MEDICATIONS AND MEDICAL HISTORY: None ANESTHESIA/SEDATION: 1.5 mg Versed, 75 mcg fentanyl 55 minutes CONTRAST:  45mL OMNIPAQUE IOHEXOL 300 MG/ML  SOLN COMPLICATIONS: None PROCEDURE: Informed consent was obtained from the patient following explanation of the procedure, risks, benefits and alternatives. The patient understands, agrees and consents for the procedure. All questions were addressed. A time out was performed. Maximal barrier sterile technique utilized including caps, mask, sterile gowns, sterile gloves, large sterile drape, hand hygiene, and 1% lidocaine was used for local anesthesia. The patient is positioned supine position on the IR table and the upper abdomen was prepped and draped in the usual sterile fashion. Scout images of the indwelling biliary tube were acquired and were used as a target for fine-needle aspiration of the soft tissues in the adjacent liver and biliary system. Three separate 21 gauge fine-needle Chiba were used for fine-needle aspirations under fluoroscopic guidance. The samples were passed to a cytotechnologist in the room. Initial quick stain was negative, and 3 separate fine-needle aspirations were acquired under fluoroscopy. A Bentson wire was advanced through the indwelling tube into the duodenum, and the tube was removed. An 8 French sheath was advanced over the wire into the biliary system. The dilator was removed and a through the tube cholangiogram was performed with multiple obliquities. A new 10 French internal-external biliary tube was then placed. Final image was  stored. Patient tolerated the procedure well and remained hemodynamically stable throughout. No complications were encountered and no significant blood loss was encountered. FINDINGS: Through the tube cholangiogram demonstrates opacification of the majority of the right-sided ductal system, which is in communication with the left-sided ductal system. There is distortion of the ducts at the confluence of the left and the right sided ducts, with a few ducts of segment 6 not opacified, potentially obstructed by the tumor. Significant common bile duct and common hepatic duct dilation, with a tubular filling defect nearly entirely filling the common hepatic duct and common bile duct to the level of the ampulla. Final image demonstrates placement of a new 55 [place] internal external biliary tube terminating in the duodenum, with the most proximal side hole within the left-sided ductal system. 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 Tissue specimen sent to pathology for complete histopathologic analysis. Status post through the tube cholangiogram with confirmation of communication of the majority of the right-sided hepatic ducts with the left-sided ducts which remain drained. Status post exchange of internal/ external biliary drain with placement of a new 10 French drain terminating in the duodenum. Signed, Dulcy Fanny. Earleen Newport, DO [facility] Electronically Signed   By: Corrie Mckusick D.O.   On: 09/20/2015 14:21   Ir Balloon Dilation Of Biliary Ducts/ampulla  09/17/2015  CLINICAL DATA:  Biliary obstruction EXAM: IR INT-EXT BILIARY DRAIN W/ CHOLANGIOGRAM; IR BALLOON DILATION OF BILIARY DUCT/AMPULLA FLUOROSCOPY TIME:  9 minutes and 6 seconds MEDICATIONS AND MEDICAL HISTORY: Versed 4 mg, Fentanyl 100 mcg. Additional Medications: None.  Patient was on Rocephin. ANESTHESIA/SEDATION: Moderate  sedation time: 60 minutes CONTRAST:  15 cc Omnipaque 300 PROCEDURE: The procedure, risks, benefits, and alternatives were explained to the patient. Questions regarding the procedure were encouraged and answered. The patient understands and consents to the procedure. The upper abdominal region was prepped with Betadine in a sterile fashion, and a sterile drape was applied covering the operative field. A  sterile gown and sterile gloves were used for the procedure. Under sonographic guidance, a Chiba needle was inserted into a left hepatic duct. Contrast was injected opacifying the biliary tree. The needle was removed over a 018 wire. The Accustick transitional dilator was advanced over the wire to the mid biliary tree. It could not be advanced into the central biliary tree secondary to a very resistant partial obstruction. An Amplatz wire was advanced through the transitional dilator. This was advanced into the central biliary tree. Again, the 6 [place] transitional dilator of the Accustick set could not be advanced across the Amplatz wire. A 4 French glide catheter was advanced across the Amplatz wire into the central biliary tree. The glide catheter was advanced over a Bentson wire into the duodenum then was again exchange for the Amplatz wire. Initial attempts at dilating across the partial obstruction with serial dilators starting from 5 [place] were unsuccessful. A 5 French sheath was advanced over the Amplatz into the peripheral biliary tree a 4 French balloon was utilized to dilate the partial obstruction. The tract was then easily dilated to 8 [place] an 8 [place] biliary drain was then advanced over the Amplatz into the duodenum. Two additional sideholes were cut above the upper marker. It was looped and string fixed in the duodenum then sewn to the skin. The initial aspirate was sent for a cytology analysis. Contrast was injected. FINDINGS: Percutaneous cholangiography into the left the biliary tree demonstrates  very limited opacification the the bile ducts. This was performed in an attempt to prevent sepsis. Final images demonstrate left internal external biliary drain placement with its tip coiled in the duodenum. Central left-sided ducts only were opacified by contrast. COMPLICATIONS: None IMPRESSION: Successful left internal external biliary drain placement. An 8 French device was placed secondary to a resistant biliary obstruction. Initial bile aspirate was sent for cytology. Electronically Signed   By: Marybelle Killings M.D.   On: 09/17/2015 08:33    Microbiology: Recent Results (from the past 240 hour(s))  Urine culture     Status: None   Collection Time: 09/28/15  7:32 AM  Result Value Ref Range Status   Specimen Description URINE, CLEAN CATCH  Final   Special Requests NONE  Final   Culture   Final    NO GROWTH 1 DAY Performed at [facility]    Report Status 09/29/2015 FINAL  Final     Labs: Basic Metabolic Panel:  Recent Labs Lab 09/28/15 0541 09/28/15 1519 09/29/15 0507 09/30/15 0532 10/01/15 0900  NA 117* 120* 124* 127* 131*  K 4.6 4.5 4.9 4.6 4.4  CL 79* 83* 93* 96* 102  CO2 25 26 22 26 25   GLUCOSE 87 168* 115* 119* 177*  BUN 66* 71* 68* 43* 27*  CREATININE 3.42* 3.55* 2.28* 1.22 0.88  CALCIUM 9.7 9.1 8.9 8.7* 8.9   Liver Function Tests:  Recent Labs Lab 09/27/15 1348 09/29/15 0507 09/30/15 0532 10/01/15 0900  AST 151* 111* 104* 91*  ALT 79* 60 58 54  ALKPHOS 164* 114 130* 132*  BILITOT 3.5* 2.7* 2.4* 2.2*  PROT 8.7* 6.3* 6.1* 6.0*  ALBUMIN 2.6* 1.9* 1.8* 1.8*    Recent Labs Lab 09/28/15 2030  LIPASE 127*    Recent Labs Lab 09/27/15 1344  AMMONIA 34   CBC:  Recent Labs Lab 09/27/15 1348 09/28/15 0541 09/29/15 0507 09/30/15 0532  WBC 17.8* 17.1* 12.4* 9.9  NEUTROABS 14.1*  --   --   --   HGB 16.7  15.2 13.3 13.1  HCT 45.0 41.9 36.8* 36.7*  MCV 99.3 100.2* 100.5* 101.4*  PLT 164 162 126* 103*   Cardiac Enzymes: No results for  input(s): CKTOTAL, CKMB, CKMBINDEX, TROPONINI in the last 168 hours. BNP: BNP (last 3 results) No results for input(s): BNP in the last 8760 hours.  ProBNP (last 3 results) No results for input(s): PROBNP in the last 8760 hours.  CBG: No results for input(s): GLUCAP in the last 168 hours.     Signed:  Guida Asman Alison Stalling  [facility] 10/01/2015, 10:39 AM

## 2015-10-01 NOTE — Progress Notes (Signed)
Eric Schroeder   DOB:11-16-1955   [id number]   [id number]  Subjective: saw pt on the floor, he was admitted forhyponatremia on 12/16,  doing better.    Objective:  Filed Vitals:   09/30/15 2207 10/01/15 0650  BP: 107/69 112/69  Pulse: 80 79  Temp: 98.2 F (36.8 C) 97.6 F (36.4 C)  Resp: 18 18    Body mass index is 22.72 kg/(m^2).  Intake/Output Summary (Last 24 hours) at 10/01/15 0716 Last data filed at 09/30/15 1800  Gross per 24 hour  Intake   1855 ml  Output    325 ml  Net   1530 ml     Sclerae unicteric  Oropharynx clear  No peripheral adenopathy  Lungs clear -- no rales or rhonchi  Heart regular rate and rhythm  Abdomen soft, biliary drainage tube capped   MSK no focal spinal tenderness, no peripheral edema  Neuro nonfocal   CBG (last 3)  No results for input(s): GLUCAP in the last 72 hours.   Labs:  Lab Results  Component Value Date   WBC 9.9 09/30/2015   HGB 13.1 09/30/2015   HCT 36.7* 09/30/2015   MCV 101.4* 09/30/2015   PLT 103* 09/30/2015   NEUTROABS 14.1* 09/27/2015    @LASTCHEMISTRY @  Urine Studies No results for input(s): UHGB, CRYS in the last 72 hours.  Invalid input(s): UACOL, UAPR, USPG, UPH, UTP, UGL, UKET, UBIL, UNIT, UROB, [place], UEPI, UWBC, [place] Duffel, [place]  Basic Metabolic Panel:  Recent Labs Lab 09/27/15 1348 09/28/15 0541 09/28/15 1519 09/29/15 0507 09/30/15 0532  NA 116* 117* 120* 124* 127*  K 4.5 4.6 4.5 4.9 4.6  CL 74* 79* 83* 93* 96*  CO2 30 25 26 22 26   GLUCOSE 99 87 168* 115* 119*  BUN 60* 66* 71* 68* 43*  CREATININE 2.37* 3.42* 3.55* 2.28* 1.22  CALCIUM 10.8* 9.7 9.1 8.9 8.7*   GFR Estimated Creatinine Clearance: 65.2 mL/min (by C-G formula based on Cr of 1.22). Liver Function Tests:  Recent Labs Lab 09/27/15 1348 09/29/15 0507 09/30/15 0532  AST 151* 111* 104*  ALT 79* 60 58  ALKPHOS 164* 114 130*  BILITOT 3.5* 2.7* 2.4*  PROT 8.7* 6.3* 6.1*  ALBUMIN 2.6* 1.9* 1.8*    Recent  Labs Lab 09/28/15 2030  LIPASE 127*    Recent Labs Lab 09/27/15 1344  AMMONIA 34   Coagulation profile No results for input(s): INR, PROTIME in the last 168 hours.  CBC:  Recent Labs Lab 09/27/15 1348 09/28/15 0541 09/29/15 0507 09/30/15 0532  WBC 17.8* 17.1* 12.4* 9.9  NEUTROABS 14.1*  --   --   --   HGB 16.7 15.2 13.3 13.1  HCT 45.0 41.9 36.8* 36.7*  MCV 99.3 100.2* 100.5* 101.4*  PLT 164 162 126* 103*   Cardiac Enzymes: No results for input(s): CKTOTAL, CKMB, CKMBINDEX, TROPONINI in the last 168 hours. BNP: Invalid input(s): POCBNP CBG: No results for input(s): GLUCAP in the last 168 hours. D-Dimer No results for input(s): DDIMER in the last 72 hours. Hgb A1c No results for input(s): HGBA1C in the last 72 hours. Lipid Profile No results for input(s): CHOL, HDL, LDLCALC, TRIG, CHOLHDL, LDLDIRECT in the last 72 hours. Thyroid function studies No results for input(s): TSH, T4TOTAL, T3FREE, THYROIDAB in the last 72 hours.  Invalid input(s): FREET3 Anemia work up No results for input(s): VITAMINB12, FOLATE, FERRITIN, TIBC, IRON, RETICCTPCT in the last 72 hours. Microbiology Recent Results (from the past 240 hour(s))  Urine  culture     Status: None   Collection Time: 09/28/15  7:32 AM  Result Value Ref Range Status   Specimen Description URINE, CLEAN CATCH  Final   Special Requests NONE  Final   Culture   Final    NO GROWTH 1 DAY Performed at [facility]    Report Status 09/29/2015 FINAL  Final      Studies:  No results found.  Assessment: 59 y.o. male, without significant past medical history, not a heavy drinker, was found to have a liver mass, liver cirrhosis with ascites and esophageal varices, readmitted for hyponatremia   1. Hyponatremia, secondary to large volume biliary drainage  2. AKI secondary to dehydration, improving  3. Right liver adenocarcinoma, likely cholangiocarcinoma 4. Liver cirrhosis, etiology unclear, with large volume  ascites and esophageal versus, and encephalopathy 5. Obstructive jaundice, secondary to #1   Plan: -Much improved after IV hydration, external biliary drainage tube is capped now -I have referred him to [facility] surgical oncology, waiting for appointments, under the financial clearance process now  -I will set up his follow up appointment for next Monday with our APP -I will see him back in early Jan  -I will discuss with rad/onc about his treatment if surgery is not offered   Eric Merle, MD 09/30/2015 7pm

## 2015-10-02 ENCOUNTER — Other Ambulatory Visit: Payer: Self-pay | Admitting: Radiology

## 2015-10-03 ENCOUNTER — Ambulatory Visit (HOSPITAL_COMMUNITY): Admission: RE | Admit: 2015-10-03 | Payer: Self-pay | Source: Ambulatory Visit

## 2015-10-03 ENCOUNTER — Ambulatory Visit (HOSPITAL_COMMUNITY): Payer: MEDICAID | Attending: General Surgery

## 2015-10-08 ENCOUNTER — Ambulatory Visit (HOSPITAL_BASED_OUTPATIENT_CLINIC_OR_DEPARTMENT_OTHER): Payer: Self-pay | Admitting: Nurse Practitioner

## 2015-10-08 ENCOUNTER — Other Ambulatory Visit (HOSPITAL_BASED_OUTPATIENT_CLINIC_OR_DEPARTMENT_OTHER): Payer: Self-pay

## 2015-10-08 VITALS — BP 121/83 | HR 94 | Temp 98.0°F | Resp 18 | Ht 69.0 in | Wt 181.6 lb

## 2015-10-08 DIAGNOSIS — C221 Intrahepatic bile duct carcinoma: Secondary | ICD-10-CM

## 2015-10-08 DIAGNOSIS — E8809 Other disorders of plasma-protein metabolism, not elsewhere classified: Secondary | ICD-10-CM

## 2015-10-08 DIAGNOSIS — C249 Malignant neoplasm of biliary tract, unspecified: Secondary | ICD-10-CM

## 2015-10-08 DIAGNOSIS — R770 Abnormality of albumin: Secondary | ICD-10-CM

## 2015-10-08 DIAGNOSIS — R740 Nonspecific elevation of levels of transaminase and lactic acid dehydrogenase [LDH]: Secondary | ICD-10-CM

## 2015-10-08 DIAGNOSIS — E871 Hypo-osmolality and hyponatremia: Secondary | ICD-10-CM

## 2015-10-08 DIAGNOSIS — R7401 Elevation of levels of liver transaminase levels: Secondary | ICD-10-CM

## 2015-10-08 DIAGNOSIS — E46 Unspecified protein-calorie malnutrition: Secondary | ICD-10-CM

## 2015-10-08 LAB — COMPREHENSIVE METABOLIC PANEL
ALBUMIN: 2.1 g/dL — AB (ref 3.5–5.0)
ALK PHOS: 191 U/L — AB (ref 40–150)
ALT: 54 U/L (ref 0–55)
ANION GAP: 7 meq/L (ref 3–11)
AST: 88 U/L — ABNORMAL HIGH (ref 5–34)
BILIRUBIN TOTAL: 2.17 mg/dL — AB (ref 0.20–1.20)
BUN: 18.5 mg/dL (ref 7.0–26.0)
CO2: 23 mEq/L (ref 22–29)
Calcium: 9 mg/dL (ref 8.4–10.4)
Chloride: 103 mEq/L (ref 98–109)
Creatinine: 0.8 mg/dL (ref 0.7–1.3)
Glucose: 173 mg/dl — ABNORMAL HIGH (ref 70–140)
Potassium: 4.7 mEq/L (ref 3.5–5.1)
Sodium: 132 mEq/L — ABNORMAL LOW (ref 136–145)
TOTAL PROTEIN: 7.1 g/dL (ref 6.4–8.3)

## 2015-10-08 LAB — CBC WITH DIFFERENTIAL/PLATELET
BASO%: 1.6 % (ref 0.0–2.0)
BASOS ABS: 0.1 10*3/uL (ref 0.0–0.1)
EOS ABS: 0.3 10*3/uL (ref 0.0–0.5)
EOS%: 3.6 % (ref 0.0–7.0)
HCT: 38 % — ABNORMAL LOW (ref 38.4–49.9)
HGB: 13.2 g/dL (ref 13.0–17.1)
LYMPH%: 16.3 % (ref 14.0–49.0)
MCH: 35.7 pg — ABNORMAL HIGH (ref 27.2–33.4)
MCHC: 34.7 g/dL (ref 32.0–36.0)
MCV: 102.7 fL — AB (ref 79.3–98.0)
MONO#: 0.6 10*3/uL (ref 0.1–0.9)
MONO%: 8 % (ref 0.0–14.0)
NEUT#: 5.3 10*3/uL (ref 1.5–6.5)
NEUT%: 70.5 % (ref 39.0–75.0)
NRBC: 0 % (ref 0–0)
PLATELETS: 89 10*3/uL — AB (ref 140–400)
RBC: 3.7 10*6/uL — AB (ref 4.20–5.82)
RDW: 13.6 % (ref 11.0–14.6)
WBC: 7.5 10*3/uL (ref 4.0–10.3)
lymph#: 1.2 10*3/uL (ref 0.9–3.3)

## 2015-10-09 ENCOUNTER — Encounter: Payer: Self-pay | Admitting: Nurse Practitioner

## 2015-10-09 ENCOUNTER — Telehealth: Payer: Self-pay | Admitting: *Deleted

## 2015-10-09 DIAGNOSIS — E46 Unspecified protein-calorie malnutrition: Secondary | ICD-10-CM | POA: Insufficient documentation

## 2015-10-09 DIAGNOSIS — R7401 Elevation of levels of liver transaminase levels: Secondary | ICD-10-CM | POA: Insufficient documentation

## 2015-10-09 DIAGNOSIS — R740 Nonspecific elevation of levels of transaminase and lactic acid dehydrogenase [LDH]: Secondary | ICD-10-CM

## 2015-10-09 DIAGNOSIS — E8809 Other disorders of plasma-protein metabolism, not elsewhere classified: Secondary | ICD-10-CM | POA: Insufficient documentation

## 2015-10-09 NOTE — Assessment & Plan Note (Signed)
Alkaline phosphatase has increased from 132 up to 191.  Most likely, elevated alkaline phosphatase is secondary to patient's newly diagnosed cancer.

## 2015-10-09 NOTE — Assessment & Plan Note (Signed)
Transaminitis continues with AST 88, and ALT 54, secondary to patient's cancer.

## 2015-10-09 NOTE — Assessment & Plan Note (Signed)
Hyponatremia continues; but slightly improved.  Sodium has improved from 131 up to 132.  Advised patient would call for follow-up on Thursday morning, 10/10/2015.  Patient may very well need additional IV fluid rehydration later this week.

## 2015-10-09 NOTE — Progress Notes (Signed)
SYMPTOM MANAGEMENT CLINIC   HPI: Eric Schroeder 59 y.o. male diagnosed with newly diagnosed cholangiocarcinoma.  Currently awaiting a [name] surgical oncology referral appointment.   Patient was admitted from 09/27/2015 through 10/01/2015 with newly diagnosed cholangiocarcinoma.  An external biliary drain was placed while admitted.  Patient is currently awaiting his initial appointment with [facility] surgical oncology for further evaluation and management.  The plan would be for patient to obtain a radiation oncology referral if no surgery is offered.  Patient and his wife state that there has been no communication with the [facility] surgical oncology team as of yet.  Patient continues to complain of moderate abdominal bloating and distention; but denies any nausea/vomiting or diarrhea.  He also denies any constipation recently.  He denies any recent fevers or chills.  Patient states that his biliary drain is capped at the present time; but he does have additional supplies at home if he needs the abdomen again this week.  Patient also reports chronic right foot drop that he has had since the onset of his symptoms, just prior to his admission to the hospital.  He denies any other new neurological symptoms.  On exam today.  Patient appears extremely fatigued and weak.  He appears pale; with just a trace of scleral icterus bilaterally.  Afternoon, mild to moderately firm; but nontender.  Labs obtained today reveal a WBC of 7.5, ANC 5.3, hemoglobin 13.2, platelet count 89.  Patient also continues with hyponatremia; but slightly improved.  Bilirubin elevated, but stable at 2.17.  Alkaline phosphatase has increased from 132 up to 191.  Liver enzymes remain elevated, but stable.  Patient's albumen continues low at 2.1.  Advised patient would follow-up with the [facility] surgical oncology referral and let him know the status regarding this appointment.  Also, advised both patient and his wife would call and check  in with the patient on Thursday morning , 10/10/2015.[name]  Patient may very well require IV fluid rehydration and reevaluation.  Patient is scheduled to return on 10/18/2015 for labs and a follow-up visit.  HPI  ROS  Past Medical History  Diagnosis Date  . Liver mass   . Hyponatremia 09/27/2015  . Hepatic cirrhosis (Eric Schroeder)     History reviewed. No pertinent past surgical history.  has Ascites; Liver mass; UTI (lower urinary tract infection); Biliary obstruction due to malignant neoplasm; Hepatic cirrhosis (Eric Schroeder); Cholangiocarcinoma of biliary tract (Eric Schroeder); Hyponatremia; Encephalopathy acute; Hyperbilirubinemia; Hyperphosphatemia; Transaminitis; and Hypoalbuminemia due to protein-calorie malnutrition (Eric Schroeder) on his problem list.    has No Known Allergies.    Medication List       This list is accurate as of: 10/08/15 11:59 PM.  Always use your most recent med list.               acetaminophen 500 MG tablet  Commonly known as:  TYLENOL  Take 500 mg by mouth every 6 (six) hours as needed for moderate pain.     amoxicillin-clavulanate 875-125 MG tablet  Commonly known as:  AUGMENTIN  Take 1 tablet by mouth every 12 (twelve) hours.     fluconazole 200 MG tablet  Commonly known as:  DIFLUCAN  Take 1 tablet (200 mg total) by mouth daily.     sodium chloride 1 g tablet  Take 1 tablet (1 g total) by mouth 2 (two) times daily with a meal.     sodium chloride 0.9 % injection  10 mLs by Other route daily. drain  PHYSICAL EXAMINATION  Oncology Vitals 10/08/2015 10/01/2015  Height 175 cm -  Weight 82.373 kg -  Weight (lbs) 181 lbs 10 oz -  BMI (kg/m2) 26.82 kg/m2 -  Temp 98 97.6  Pulse 94 79  Resp 18 18  SpO2 100 100  BSA (m2) 2 m2 -   BP Readings from Last 2 Encounters:  10/08/15 121/83  10/01/15 112/69    Physical Exam  Constitutional: He is oriented to person, place, and time. Vital signs are normal. He appears malnourished and dehydrated. He appears  unhealthy. He appears cachectic.  HENT:  Head: Normocephalic and atraumatic.  Mouth/Throat: Oropharynx is clear and moist.  Eyes: Conjunctivae and EOM are normal. Pupils are equal, round, and reactive to light. Right eye exhibits no discharge. Left eye exhibits no discharge. Scleral icterus is present.  Trace bilateral scleral icterus.  Neck: Normal range of motion. Neck supple. No JVD present. No tracheal deviation present. No thyromegaly present.  Cardiovascular: Normal rate, regular rhythm, normal heart sounds and intact distal pulses.   Pulmonary/Chest: Effort normal and breath sounds normal. No respiratory distress. He has no wheezes. He has no rales. He exhibits no tenderness.  Abdominal: Soft. Bowel sounds are normal. He exhibits distension. He exhibits no mass. There is no tenderness. There is no rebound and no guarding.  Biliary drain capped and intact.  Musculoskeletal: Normal range of motion. He exhibits no edema or tenderness.  Lymphadenopathy:    He has no cervical adenopathy.  Neurological: He is alert and oriented to person, place, and time. Gait normal.  Skin: Skin is warm and dry. No rash noted. No erythema. There is pallor.  Psychiatric: Affect normal.  Nursing note and vitals reviewed.   LABORATORY DATA:. Appointment on 10/08/2015  Component Date Value Ref Range Status  . WBC 10/08/2015 7.5  4.0 - 10.3 10e3/uL Final  . NEUT# 10/08/2015 5.3  1.5 - 6.5 10e3/uL Final  . HGB 10/08/2015 13.2  13.0 - 17.1 g/dL Final  . HCT 10/08/2015 38.0* 38.4 - 49.9 % Final  . Platelets 10/08/2015 89* 140 - 400 10e3/uL Final  . MCV 10/08/2015 102.7* 79.3 - 98.0 fL Final  . MCH 10/08/2015 35.7* 27.2 - 33.4 pg Final  . MCHC 10/08/2015 34.7  32.0 - 36.0 g/dL Final  . RBC 10/08/2015 3.70* 4.20 - 5.82 10e6/uL Final  . RDW 10/08/2015 13.6  11.0 - 14.6 % Final  . lymph# 10/08/2015 1.2  0.9 - 3.3 10e3/uL Final  . MONO# 10/08/2015 0.6  0.1 - 0.9 10e3/uL Final  . Eosinophils Absolute  10/08/2015 0.3  0.0 - 0.5 10e3/uL Final  . Basophils Absolute 10/08/2015 0.1  0.0 - 0.1 10e3/uL Final  . NEUT% 10/08/2015 70.5  39.0 - 75.0 % Final  . LYMPH% 10/08/2015 16.3  14.0 - 49.0 % Final  . MONO% 10/08/2015 8.0  0.0 - 14.0 % Final  . EOS% 10/08/2015 3.6  0.0 - 7.0 % Final  . BASO% 10/08/2015 1.6  0.0 - 2.0 % Final  . nRBC 10/08/2015 0  0 - 0 % Final  . Sodium 10/08/2015 132* 136 - 145 mEq/L Final  . Potassium 10/08/2015 4.7  3.5 - 5.1 mEq/L Final  . Chloride 10/08/2015 103  98 - 109 mEq/L Final  . CO2 10/08/2015 23  22 - 29 mEq/L Final  . Glucose 10/08/2015 173* 70 - 140 mg/dl Final   Glucose reference range is for nonfasting patients. Fasting glucose reference range is 70- 100.  [name] BUN 10/08/2015 18.5  7.0 - 26.0 mg/dL Final  . Creatinine 10/08/2015 0.8  0.7 - 1.3 mg/dL Final  . Total Bilirubin 10/08/2015 2.17* 0.20 - 1.20 mg/dL Final  . Alkaline Phosphatase 10/08/2015 191* 40 - 150 U/L Final  . AST 10/08/2015 88* 5 - 34 U/L Final  . ALT 10/08/2015 54  0 - 55 U/L Final  . Total Protein 10/08/2015 7.1  6.4 - 8.3 g/dL Final  . Albumin 10/08/2015 2.1* 3.5 - 5.0 g/dL Final  . Calcium 10/08/2015 9.0  8.4 - 10.4 mg/dL Final  . Anion Gap 10/08/2015 7  3 - 11 mEq/L Final  . EGFR 10/08/2015 >90  >90 ml/min/1.73 m2 Final   eGFR is calculated using the CKD-EPI Creatinine Equation (2009)     RADIOGRAPHIC STUDIES: No results found.  ASSESSMENT/PLAN:    Cholangiocarcinoma of biliary tract [employer]) Patient was admitted from 09/27/2015 through 10/01/2015 with newly diagnosed cholangiocarcinoma.  An external biliary drain was placed while admitted.  Patient is currently awaiting his initial appointment with [facility] surgical oncology for further evaluation and management.  The plan would be for patient to obtain a radiation oncology referral if no surgery is offered.  Patient and his wife state that there has been no communication with the [facility] surgical oncology team as of yet.  Patient  continues to complain of moderate abdominal bloating and distention; but denies any nausea/vomiting or diarrhea.  He also denies any constipation recently.  He denies any recent fevers or chills.  Patient states that his biliary drain is capped at the present time; but he does have additional supplies at home if he needs the abdomen again this week.  Patient also reports chronic right foot drop that he has had since the onset of his symptoms, just prior to his admission to the hospital.  He denies any other new neurological symptoms.  On exam today.  Patient appears extremely fatigued and weak.  He appears pale; with just a trace of scleral icterus bilaterally.  Afternoon, mild to moderately firm; but nontender.  Labs obtained today reveal a WBC of 7.5, ANC 5.3, hemoglobin 13.2, platelet count 89.  Patient also continues with hyponatremia; but slightly improved.  Bilirubin elevated, but stable at 2.17.  Alkaline phosphatase has increased from 132 up to 191.  Liver enzymes remain elevated, but stable.  Patient's albumen continues low at 2.1.  Discussed option of giving IV fluid rehydration today; but patient refused.  He stated that he felt he could push fluids./Gatorade at home.  Advised patient would follow-up with the [facility] surgical oncology referral and let him know the status regarding this appointment.  Also, advised both patient and his wife would call and check in with the patient on Thursday morning , 10/10/2015.[name]  Patient may very well require IV fluid rehydration and reevaluation.  Patient is scheduled to return on 10/18/2015 for labs and a follow-up visit.  Hyponatremia Hyponatremia continues; but slightly improved.  Sodium has improved from 131 up to 132.  Advised patient would call for follow-up on Thursday morning, 10/10/2015.  Patient may very well need additional IV fluid rehydration later this week.  Hyperbilirubinemia Bilirubin slightly improved from 2.2 down to 2.17.  Most  likely, hyperbilirubinemia is secondary to patient's newly diagnosed cancer.  Hyperphosphatemia Alkaline phosphatase has increased from 132 up to 191.  Most likely, elevated alkaline phosphatase is secondary to patient's newly diagnosed cancer.  Transaminitis Transaminitis continues with AST 88, and ALT 54, secondary to patient's cancer.  Hypoalbuminemia due to protein-calorie malnutrition ([place])  Albumen continues low at 2.1.  Patient was encouraged to push protein in his diet is much as possible.   Patient stated understanding of all instructions; and was in agreement with this plan of care. The patient knows to call the clinic with any problems, questions or concerns.   Review/collaboration with Dr. Burr Medico regarding all aspects of patient's visit today.   Total time spent with patient was 40 minutes;  with greater than 75 percent of that time spent in face to face counseling regarding patient's symptoms,  and coordination of care and follow up.  Disclaimer:This dictation was prepared with Dragon/digital dictation along with [employer]. Any transcriptional errors that result from this process are unintentional.  Drue Second, NP 10/09/2015

## 2015-10-09 NOTE — Telephone Encounter (Signed)
TC to pt- he is starting to feel better. He states he has been pushing fluids. He has begun to eat small portions of food. He feels he can't eat too much at this point, he feels full fast and concerned he may vomit. He has not had any vomiting or diarrhea.   Advised pt to call this office if he feels he needs fluids. He denies any concerns or further questions at this time.

## 2015-10-09 NOTE — Assessment & Plan Note (Signed)
Albumen continues low at 2.1.  Patient was encouraged to push protein in his diet is much as possible.

## 2015-10-09 NOTE — Assessment & Plan Note (Signed)
Bilirubin slightly improved from 2.2 down to 2.17.  Most likely, hyperbilirubinemia is secondary to patient's newly diagnosed cancer.

## 2015-10-09 NOTE — Assessment & Plan Note (Addendum)
Patient was admitted from 09/27/2015 through 10/01/2015 with newly diagnosed cholangiocarcinoma.  An external biliary drain was placed while admitted.  Patient is currently awaiting his initial appointment with [facility] surgical oncology for further evaluation and management.  The plan would be for patient to obtain a radiation oncology referral if no surgery is offered.  Patient and his wife state that there has been no communication with the [facility] surgical oncology team as of yet.  Patient continues to complain of moderate abdominal bloating and distention; but denies any nausea/vomiting or diarrhea.  He also denies any constipation recently.  He denies any recent fevers or chills.  Patient states that his biliary drain is capped at the present time; but he does have additional supplies at home if he needs the abdomen again this week.  Patient also reports chronic right foot drop that he has had since the onset of his symptoms, just prior to his admission to the hospital.  He denies any other new neurological symptoms.  On exam today.  Patient appears extremely fatigued and weak.  He appears pale; with just a trace of scleral icterus bilaterally.  Afternoon, mild to moderately firm; but nontender.  Labs obtained today reveal a WBC of 7.5, ANC 5.3, hemoglobin 13.2, platelet count 89.  Patient also continues with hyponatremia; but slightly improved.  Bilirubin elevated, but stable at 2.17.  Alkaline phosphatase has increased from 132 up to 191.  Liver enzymes remain elevated, but stable.  Patient's albumen continues low at 2.1.  Discussed option of giving IV fluid rehydration today; but patient refused.  He stated that he felt he could push fluids./Gatorade at home.  Advised patient would follow-up with the [facility] surgical oncology referral and let him know the status regarding this appointment.  Also, advised both patient and his wife would call and check in with the patient on Thursday morning ,  10/10/2015.[name]  Patient may very well require IV fluid rehydration and reevaluation.  Patient is scheduled to return on 10/18/2015 for labs and a follow-up visit.

## 2015-10-10 ENCOUNTER — Telehealth: Payer: Self-pay | Admitting: *Deleted

## 2015-10-10 NOTE — Telephone Encounter (Signed)
Called & spoke to [place] at [facility] regarding referral for this pt for general surgery consult.  She states that referral received but waiting on demographics & insurance info.  Spoke to [employer] & she will refax this info. Verified that the fax # in the [place] note was correct. Informed Horris Latino that this pt will need to be seen ASAP per Selena Lesser NP.

## 2015-10-11 ENCOUNTER — Ambulatory Visit (HOSPITAL_BASED_OUTPATIENT_CLINIC_OR_DEPARTMENT_OTHER): Payer: Self-pay | Admitting: Nurse Practitioner

## 2015-10-11 ENCOUNTER — Telehealth: Payer: Self-pay | Admitting: *Deleted

## 2015-10-11 ENCOUNTER — Ambulatory Visit (HOSPITAL_BASED_OUTPATIENT_CLINIC_OR_DEPARTMENT_OTHER): Payer: Self-pay

## 2015-10-11 ENCOUNTER — Other Ambulatory Visit: Payer: Self-pay | Admitting: Hematology

## 2015-10-11 ENCOUNTER — Encounter: Payer: Self-pay | Admitting: Nurse Practitioner

## 2015-10-11 ENCOUNTER — Inpatient Hospital Stay (HOSPITAL_COMMUNITY)
Admission: AD | Admit: 2015-10-11 | Discharge: 2015-11-13 | DRG: 432 | Disposition: E | Payer: BLUE CROSS/BLUE SHIELD | Source: Ambulatory Visit | Attending: Internal Medicine | Admitting: Internal Medicine

## 2015-10-11 ENCOUNTER — Other Ambulatory Visit: Payer: Self-pay | Admitting: *Deleted

## 2015-10-11 ENCOUNTER — Telehealth: Payer: Self-pay | Admitting: Hematology

## 2015-10-11 VITALS — BP 130/59 | HR 99 | Temp 97.6°F | Resp 19 | Wt 179.2 lb

## 2015-10-11 DIAGNOSIS — E875 Hyperkalemia: Secondary | ICD-10-CM | POA: Diagnosis present

## 2015-10-11 DIAGNOSIS — E872 Acidosis: Secondary | ICD-10-CM | POA: Diagnosis present

## 2015-10-11 DIAGNOSIS — B1920 Unspecified viral hepatitis C without hepatic coma: Secondary | ICD-10-CM | POA: Diagnosis present

## 2015-10-11 DIAGNOSIS — D539 Nutritional anemia, unspecified: Secondary | ICD-10-CM | POA: Diagnosis not present

## 2015-10-11 DIAGNOSIS — R578 Other shock: Secondary | ICD-10-CM | POA: Diagnosis not present

## 2015-10-11 DIAGNOSIS — E43 Unspecified severe protein-calorie malnutrition: Secondary | ICD-10-CM | POA: Diagnosis present

## 2015-10-11 DIAGNOSIS — D62 Acute posthemorrhagic anemia: Secondary | ICD-10-CM | POA: Diagnosis present

## 2015-10-11 DIAGNOSIS — K831 Obstruction of bile duct: Secondary | ICD-10-CM | POA: Diagnosis present

## 2015-10-11 DIAGNOSIS — K767 Hepatorenal syndrome: Secondary | ICD-10-CM | POA: Diagnosis present

## 2015-10-11 DIAGNOSIS — E46 Unspecified protein-calorie malnutrition: Secondary | ICD-10-CM | POA: Diagnosis present

## 2015-10-11 DIAGNOSIS — Z515 Encounter for palliative care: Secondary | ICD-10-CM | POA: Diagnosis present

## 2015-10-11 DIAGNOSIS — C221 Intrahepatic bile duct carcinoma: Secondary | ICD-10-CM

## 2015-10-11 DIAGNOSIS — E8809 Other disorders of plasma-protein metabolism, not elsewhere classified: Secondary | ICD-10-CM | POA: Diagnosis present

## 2015-10-11 DIAGNOSIS — G9340 Encephalopathy, unspecified: Secondary | ICD-10-CM | POA: Diagnosis present

## 2015-10-11 DIAGNOSIS — Z8505 Personal history of malignant neoplasm of liver: Secondary | ICD-10-CM

## 2015-10-11 DIAGNOSIS — B9562 Methicillin resistant Staphylococcus aureus infection as the cause of diseases classified elsewhere: Secondary | ICD-10-CM | POA: Diagnosis present

## 2015-10-11 DIAGNOSIS — R188 Other ascites: Secondary | ICD-10-CM | POA: Diagnosis present

## 2015-10-11 DIAGNOSIS — N179 Acute kidney failure, unspecified: Secondary | ICD-10-CM | POA: Diagnosis present

## 2015-10-11 DIAGNOSIS — C249 Malignant neoplasm of biliary tract, unspecified: Secondary | ICD-10-CM

## 2015-10-11 DIAGNOSIS — E860 Dehydration: Secondary | ICD-10-CM

## 2015-10-11 DIAGNOSIS — K652 Spontaneous bacterial peritonitis: Secondary | ICD-10-CM | POA: Insufficient documentation

## 2015-10-11 DIAGNOSIS — R627 Adult failure to thrive: Secondary | ICD-10-CM | POA: Diagnosis present

## 2015-10-11 DIAGNOSIS — R109 Unspecified abdominal pain: Secondary | ICD-10-CM

## 2015-10-11 DIAGNOSIS — C801 Malignant (primary) neoplasm, unspecified: Secondary | ICD-10-CM | POA: Diagnosis present

## 2015-10-11 DIAGNOSIS — K920 Hematemesis: Secondary | ICD-10-CM | POA: Insufficient documentation

## 2015-10-11 DIAGNOSIS — Z66 Do not resuscitate: Secondary | ICD-10-CM | POA: Diagnosis not present

## 2015-10-11 DIAGNOSIS — K7460 Unspecified cirrhosis of liver: Secondary | ICD-10-CM | POA: Diagnosis present

## 2015-10-11 DIAGNOSIS — M21371 Foot drop, right foot: Secondary | ICD-10-CM | POA: Diagnosis present

## 2015-10-11 DIAGNOSIS — E871 Hypo-osmolality and hyponatremia: Secondary | ICD-10-CM | POA: Diagnosis present

## 2015-10-11 DIAGNOSIS — Z6827 Body mass index (BMI) 27.0-27.9, adult: Secondary | ICD-10-CM

## 2015-10-11 DIAGNOSIS — Z79899 Other long term (current) drug therapy: Secondary | ICD-10-CM

## 2015-10-11 DIAGNOSIS — I480 Paroxysmal atrial fibrillation: Secondary | ICD-10-CM | POA: Diagnosis not present

## 2015-10-11 DIAGNOSIS — I8501 Esophageal varices with bleeding: Secondary | ICD-10-CM | POA: Insufficient documentation

## 2015-10-11 DIAGNOSIS — D6959 Other secondary thrombocytopenia: Secondary | ICD-10-CM | POA: Diagnosis present

## 2015-10-11 LAB — COMPREHENSIVE METABOLIC PANEL
ALBUMIN: 2 g/dL — AB (ref 3.5–5.0)
ALK PHOS: 159 U/L — AB (ref 40–150)
ALT: 39 U/L (ref 0–55)
ANION GAP: 9 meq/L (ref 3–11)
AST: 49 U/L — AB (ref 5–34)
BUN: 38.5 mg/dL — AB (ref 7.0–26.0)
CALCIUM: 10.1 mg/dL (ref 8.4–10.4)
CO2: 23 mEq/L (ref 22–29)
CREATININE: 1.2 mg/dL (ref 0.7–1.3)
Chloride: 100 mEq/L (ref 98–109)
EGFR: 69 mL/min/{1.73_m2} — ABNORMAL LOW (ref >90)
Glucose: 113 mg/dl (ref 70–140)
POTASSIUM: 5.5 meq/L — AB (ref 3.5–5.1)
Sodium: 132 mEq/L — ABNORMAL LOW (ref 136–145)
Total Bilirubin: 3.2 mg/dL — ABNORMAL HIGH (ref 0.20–1.20)
Total Protein: 7.5 g/dL (ref 6.4–8.3)

## 2015-10-11 LAB — CBC WITH DIFFERENTIAL/PLATELET
BASO%: 0.5 % (ref 0.0–2.0)
BASOS ABS: 0.1 10*3/uL (ref 0.0–0.1)
EOS ABS: 0.2 10*3/uL (ref 0.0–0.5)
EOS%: 2.1 % (ref 0.0–7.0)
HEMATOCRIT: 40.2 % (ref 38.4–49.9)
HEMOGLOBIN: 13.4 g/dL (ref 13.0–17.1)
LYMPH#: 0.6 10*3/uL — AB (ref 0.9–3.3)
LYMPH%: 5.8 % — ABNORMAL LOW (ref 14.0–49.0)
MCH: 35.9 pg — AB (ref 27.2–33.4)
MCHC: 33.4 g/dL (ref 32.0–36.0)
MCV: 107.5 fL — ABNORMAL HIGH (ref 79.3–98.0)
MONO#: 0.8 10*3/uL (ref 0.1–0.9)
MONO%: 7.9 % (ref 0.0–14.0)
NEUT#: 8.5 10*3/uL — ABNORMAL HIGH (ref 1.5–6.5)
NEUT%: 83.7 % — AB (ref 39.0–75.0)
PLATELETS: 101 10*3/uL — AB (ref 140–400)
RBC: 3.73 10*6/uL — ABNORMAL LOW (ref 4.20–5.82)
RDW: 14.1 % (ref 11.0–14.6)
WBC: 10.2 10*3/uL (ref 4.0–10.3)

## 2015-10-11 LAB — BASIC METABOLIC PANEL
Anion gap: 8 (ref 5–15)
BUN: 42 mg/dL — AB (ref 6–20)
CHLORIDE: 97 mmol/L — AB (ref 101–111)
CO2: 24 mmol/L (ref 22–32)
Calcium: 9.6 mg/dL (ref 8.9–10.3)
Creatinine, Ser: 1.1 mg/dL (ref 0.61–1.24)
GFR calc Af Amer: >60 mL/min (ref >60)
GFR calc non Af Amer: >60 mL/min (ref >60)
Glucose, Bld: 108 mg/dL — ABNORMAL HIGH (ref 65–99)
POTASSIUM: 4.7 mmol/L (ref 3.5–5.1)
SODIUM: 129 mmol/L — AB (ref 135–145)

## 2015-10-11 MED ORDER — SODIUM CHLORIDE 1 G PO TABS
1.0000 g | ORAL_TABLET | Freq: Three times a day (TID) | ORAL | Status: DC
  Administered 2015-10-12 – 2015-10-15 (×10): 1 g via ORAL
  Filled 2015-10-11 (×20): qty 1

## 2015-10-11 MED ORDER — SODIUM CHLORIDE 0.9 % IV SOLN
Freq: Once | INTRAVENOUS | Status: AC
  Administered 2015-10-11: 16:00:00 via INTRAVENOUS

## 2015-10-11 MED ORDER — SODIUM CHLORIDE 0.9 % IV BOLUS (SEPSIS)
1000.0000 mL | Freq: Once | INTRAVENOUS | Status: AC
  Administered 2015-10-11: 1000 mL via INTRAVENOUS

## 2015-10-11 MED ORDER — PROMETHAZINE HCL 25 MG/ML IJ SOLN: 12.5000 mg | Freq: Four times a day (QID) | INTRAMUSCULAR | Status: DC | PRN

## 2015-10-11 MED ORDER — SODIUM POLYSTYRENE SULFONATE 15 GM/60ML PO SUSP
15.0000 g | Freq: Once | ORAL | Status: AC
  Administered 2015-10-11: 15 g via ORAL
  Filled 2015-10-11: qty 60

## 2015-10-11 MED ORDER — DIPHENHYDRAMINE HCL 25 MG PO CAPS: 25.0000 mg | ORAL_CAPSULE | Freq: Every evening | ORAL | Status: DC | PRN

## 2015-10-11 MED ORDER — KETOROLAC TROMETHAMINE 15 MG/ML IJ SOLN
7.5000 mg | Freq: Three times a day (TID) | INTRAMUSCULAR | Status: DC | PRN
  Administered 2015-10-12 – 2015-10-13 (×5): 7.5 mg via INTRAVENOUS
  Filled 2015-10-11 (×5): qty 1

## 2015-10-11 MED ORDER — SODIUM CHLORIDE 0.9 % IV SOLN
INTRAVENOUS | Status: DC
  Administered 2015-10-11 – 2015-10-16 (×6): via INTRAVENOUS
  Administered 2015-10-16: 150 mL/h via INTRAVENOUS
  Administered 2015-10-16 – 2015-10-17 (×4): via INTRAVENOUS

## 2015-10-11 MED ORDER — ONDANSETRON HCL 4 MG/2ML IJ SOLN: 4.0000 mg | Freq: Four times a day (QID) | INTRAMUSCULAR | Status: DC | PRN

## 2015-10-11 MED ORDER — ZOLPIDEM TARTRATE 5 MG PO TABS
5.0000 mg | ORAL_TABLET | Freq: Every evening | ORAL | Status: DC | PRN
  Administered 2015-10-11: 5 mg via ORAL
  Filled 2015-10-11: qty 1

## 2015-10-11 MED ORDER — SODIUM CHLORIDE 0.9 % IJ SOLN
3.0000 mL | Freq: Two times a day (BID) | INTRAMUSCULAR | Status: DC
  Administered 2015-10-11 – 2015-10-17 (×9): 3 mL via INTRAVENOUS

## 2015-10-11 NOTE — Assessment & Plan Note (Signed)
Albumen has decreased from 2.1 down to 2.0.  Patient was encouraged to push protein in his diet is much as possible.

## 2015-10-11 NOTE — Progress Notes (Signed)
Direct admission from the cancer center on 12/30 Eric Schroeder  is a 82yr male who was recently diagnosed with cholangiocarcinoma and obstructive juandice, s/p biliary drain, today he was seen at oncology's office and was found to be weaker and dehydrated with reported increased biliary drain and possible increased ascites, per oncology, no concerns for sbp. Vital stable, he is referred for direct admission for dehydration, mild hyponatremia ( patient has been taking salt tabs at home), mild hyperkalemia, k of 5.5. Increase total bili compare to a few days ago, oncology office notified on call oncology Eric Schroeder for this admission.  Patient 's primary oncologist Eric Schroeder is on vacation.  Will likely need to call IR for biliary drain management, may need paracentesis if significant ascites.   Please call flow manager upon patient's arrival to floor.

## 2015-10-11 NOTE — Assessment & Plan Note (Signed)
Bilirubin is elevated once again from 2.17 up to 3.2 today.  Patient is experiencing increased fluid output from his biliary drain as well today.

## 2015-10-11 NOTE — H&P (Signed)
History and Physical  Eric Schroeder [id number] DOB: 1956-07-16 DOA: 09/29/2015  Referring physician: EDP PCP: No PCP Per Patient   Chief Complaint: increased biliary drain output/dehydration, worsening of ascites  HPI: Eric Schroeder is a 59 y.o. male  Who was recently diagnosed with cholangiocarcinoma s/p biliary drain is sent from oncology clinic for direct admission due to increased biliary drain, feeling dehydrated and worsening of ascites, he reports abdominal discomfort, but denies significant pain, no fever, no lower extremity edema.   Review of Systems:  Detail per HPI, Review of systems are otherwise negative  Past Medical History  Diagnosis Date  . Liver mass   . Hyponatremia 09/27/2015  . Hepatic cirrhosis ([place])    No past surgical history on file. Social History:  reports that he has never smoked. He does not have any smokeless tobacco history on file. He reports that he does not drink alcohol or use illicit drugs. Patient lives at home& is able to participate in activities of daily living independently   No Known Allergies  No family history on file.    Prior to Admission medications   Medication Sig Start Date End Date Taking? Authorizing Provider  acetaminophen (TYLENOL) 500 MG tablet Take 500 mg by mouth every 6 (six) hours as needed for moderate pain.   Yes Historical Provider, MD  amoxicillin-clavulanate (AUGMENTIN) 875-125 MG tablet Take 1 tablet by mouth every 12 (twelve) hours. 10/01/15  Yes Geradine Girt, DO  fluconazole (DIFLUCAN) 200 MG tablet Take 1 tablet (200 mg total) by mouth daily. 09/23/15  Yes Nita Sells, MD  sodium chloride 0.9 % injection 10 mLs by Other route daily. drain 10/01/15  Yes Geradine Girt, DO  sodium chloride 1 G tablet Take 1 tablet (1 g total) by mouth 2 (two) times daily with a meal. 09/23/15  Yes Nita Sells, MD    Physical Exam: BP 101/60 mmHg  Pulse 95  Temp(Src) 98.8 F (37.1 C) (Oral)  Resp 18   Ht 5\' 9"  (1.753 m)  Wt 179 lb (81.194 kg)  BMI 26.42 kg/m2  SpO2 95%  General:  Thin frail, mild jaundice, NAD Eyes: PERRL ENT: unremarkable Neck: supple, no JVD Cardiovascular: RRR Respiratory: CTABL Abdomen: distended but soft, no significant tender, no rebound, positive bowel sounds, +biliary drain  Skin: no rash Musculoskeletal:  No edema Psychiatric: calm/cooperative Neurologic: no focal findings , chronic right foot drop          Labs on Admission:  Basic Metabolic Panel:  Recent Labs Lab 10/08/15 1121 09/19/2015 1325  NA 132* 132*  K 4.7 5.5*  CO2 23 23  GLUCOSE 173* 113  BUN 18.5 38.5*  CREATININE 0.8 1.2  CALCIUM 9.0 10.1   Liver Function Tests:  Recent Labs Lab 10/08/15 1121 09/26/2015 1325  AST 88* 49*  ALT 54 39  ALKPHOS 191* 159*  BILITOT 2.17* 3.20*  PROT 7.1 7.5  ALBUMIN 2.1* 2.0*   No results for input(s): LIPASE, AMYLASE in the last 168 hours. No results for input(s): AMMONIA in the last 168 hours. CBC:  Recent Labs Lab 10/08/15 1121 09/19/2015 1325  WBC 7.5 10.2  NEUTROABS 5.3 8.5*  HGB 13.2 13.4  HCT 38.0* 40.2  MCV 102.7* 107.5*  PLT 89* 101*   Cardiac Enzymes: No results for input(s): CKTOTAL, CKMB, CKMBINDEX, TROPONINI in the last 168 hours.  BNP (last 3 results) No results for input(s): BNP in the last 8760 hours.  ProBNP (last 3 results) No results for input(s):  PROBNP in the last 8760 hours.  CBG: No results for input(s): GLUCAP in the last 168 hours.  Radiological Exams on Admission: No results found.   Assessment/Plan Present on Admission:  . Dehydration  Dehydration/hyponatremia/increased biliary drain: ivf, salt tabs, IR consult in am.  Ascites: US guided paracentesis, fluids studies.  Malnutrition: nutrition supplement  Cholangiocarcinoma: with elevated lft and resulted mild elevated INR, defer to oncology   DVT prophylaxis:  scd  Consultants:  IR Oncology (dr Marin Olp)  Code Status: full    Family Communication:  Patient   Disposition Plan: admit to med surg  Time spent: 71mins  Mackinzee Roszak MD, PhD [facility] Pager [phone] If 7PM-7AM, please contact night-coverage at [email or web address], password [facility]

## 2015-10-11 NOTE — Telephone Encounter (Signed)
Called patient and he is aware of chemo class and lab and per mytle she may be getting him in for ivf today as well   anne

## 2015-10-11 NOTE — Assessment & Plan Note (Signed)
Potassium has increased to 5.5.  Confirmed the patient is not taking any potassium supplements.  Most likely, hyperkalemia secondary to dehydration.  Will continue to monitor closely.

## 2015-10-11 NOTE — Progress Notes (Signed)
SYMPTOM MANAGEMENT CLINIC   HPI: Eric Schroeder 59 y.o. 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  Currently awaiting a [facility] 2 surgical oncology referral appointment.  Patient percent of that to the [facility] today with complaint of increased generalized abdominal discomfort, mild ascites, and large amount of output from his biliary drain.  Patient feels increasingly weak and dehydrated today.  He denies any recent fevers or chills.  Patient continues with chronic right foot drop since his initial diagnosis several weeks ago.  He reports he has not heard back from [facility] for referral appointment as of yet.   HPI  ROS  Past Medical History  Diagnosis Date  . Liver mass   . Hyponatremia 09/27/2015  . Hepatic cirrhosis ([place])     History reviewed. No pertinent past surgical history.  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    has No Known Allergies.    Medication List       This list is accurate as of: 09/18/2015  5:11 PM.  Always use your most recent med list.               acetaminophen 500 MG tablet  Commonly known as:  TYLENOL  Take 500 mg by mouth every 6 (six) hours as needed for moderate pain.     amoxicillin-clavulanate 875-125 MG tablet  Commonly known as:  AUGMENTIN  Take 1 tablet by mouth every 12 (twelve) hours.     fluconazole 200 MG tablet  Commonly known as:  DIFLUCAN  Take 1 tablet (200 mg total) by mouth daily.     sodium chloride 1 g tablet  Take 1 tablet (1 g total) by mouth 2 (two) times daily with a meal.     sodium chloride 0.9 % injection  10 mLs by Other route daily. drain         PHYSICAL  EXAMINATION  Oncology Vitals 09/14/2015 09/24/2015  Height 175 cm -  Weight 81.194 kg 81.285 kg  Weight (lbs) 179 lbs 179 lbs 3 oz  BMI (kg/m2) 26.43 kg/m2 -  Temp 98.8 97.6  Pulse 95 99  Resp 18 19  SpO2 95 99  BSA (m2) 1.99 m2 -   BP Readings from Last 2 Encounters:  09/21/2015 101/60  09/20/2015 130/59    Physical Exam  Constitutional: He is oriented to person, place, and time. Vital signs are normal. He appears malnourished and dehydrated. He appears unhealthy. He appears cachectic. He has a sickly appearance.  HENT:  Head: Normocephalic and atraumatic.  Eyes: Conjunctivae and EOM are normal. Pupils are equal, round, and reactive to light. Right eye exhibits no discharge. Left eye exhibits no discharge. Scleral icterus is present.  Neck: Normal range of motion.  Pulmonary/Chest: Effort normal. No respiratory distress.  Abdominal: Bowel sounds are normal. He exhibits distension. He exhibits no mass. There is tenderness. There is no rebound and no guarding.  Biliary drain intact; draining a amber fluid.  Mildly distended and only slightly firm.  Trace tenderness with palpation to generalized abdomen.  Musculoskeletal: Normal range of motion.  Neurological: He is alert and oriented to person, place, and time. Gait normal.  Skin: Skin is warm and dry. No rash noted. No erythema. There is pallor.  Psychiatric: Affect normal.  Nursing note  and vitals reviewed.   LABORATORY DATA:. Appointment on 09/28/2015  Component Date Value Ref Range Status  . WBC 09/24/2015 10.2  4.0 - 10.3 10e3/uL Final  . NEUT# 10/03/2015 8.5* 1.5 - 6.5 10e3/uL Final  . HGB 10/01/2015 13.4  13.0 - 17.1 g/dL Final  . HCT 09/22/2015 40.2  38.4 - 49.9 % Final  . Platelets 09/12/2015 101* 140 - 400 10e3/uL Final  . MCV 10/01/2015 107.5* 79.3 - 98.0 fL Final  . MCH 09/25/2015 35.9* 27.2 - 33.4 pg Final  . MCHC 10/04/2015 33.4  32.0 - 36.0 g/dL Final  . RBC 10/04/2015 3.73* 4.20 - 5.82 10e6/uL Final  . RDW  09/12/2015 14.1  11.0 - 14.6 % Final  . lymph# 10/08/2015 0.6* 0.9 - 3.3 10e3/uL Final  . MONO# 09/21/2015 0.8  0.1 - 0.9 10e3/uL Final  . Eosinophils Absolute 09/18/2015 0.2  0.0 - 0.5 10e3/uL Final  . Basophils Absolute 09/27/2015 0.1  0.0 - 0.1 10e3/uL Final  . NEUT% 10/10/2015 83.7* 39.0 - 75.0 % Final  . LYMPH% 10/07/2015 5.8* 14.0 - 49.0 % Final  . MONO% 09/27/2015 7.9  0.0 - 14.0 % Final  . EOS% 09/29/2015 2.1  0.0 - 7.0 % Final  . BASO% 09/15/2015 0.5  0.0 - 2.0 % Final  . Sodium 10/10/2015 132* 136 - 145 mEq/L Final  . Potassium 09/22/2015 5.5* 3.5 - 5.1 mEq/L Final  . Chloride 09/23/2015 100  98 - 109 mEq/L Final  . CO2 09/14/2015 23  22 - 29 mEq/L Final  . Glucose 10/10/2015 113  70 - 140 mg/dl Final   Glucose reference range is for nonfasting patients. Fasting glucose reference range is 70- 100.  [name] BUN 09/18/2015 38.5* 7.0 - 26.0 mg/dL Final  . Creatinine 10/03/2015 1.2  0.7 - 1.3 mg/dL Final  . Total Bilirubin 09/23/2015 3.20* 0.20 - 1.20 mg/dL Final  . Alkaline Phosphatase 09/22/2015 159* 40 - 150 U/L Final  . AST 09/12/2015 49* 5 - 34 U/L Final  . ALT 10/12/2015 39  0 - 55 U/L Final  . Total Protein 10/10/2015 7.5  6.4 - 8.3 g/dL Final  . Albumin 09/25/2015 2.0* 3.5 - 5.0 g/dL Final  . Calcium 10/10/2015 10.1  8.4 - 10.4 mg/dL Final  . Anion Gap 09/16/2015 9  3 - 11 mEq/L Final  . EGFR 10/07/2015 69* >90 ml/min/1.73 m2 Final   eGFR is calculated using the CKD-EPI Creatinine Equation (2009)     RADIOGRAPHIC STUDIES: No results found.  ASSESSMENT/PLAN:    Cholangiocarcinoma of biliary tract [facility]) Patient was admitted from 09/27/2015 through 10/01/2015 with newly diagnosed cholangiocarcinoma.  An external biliary drain was placed while admitted.  Patient is currently awaiting his initial appointment with [facility] surgical oncology for further evaluation and management.  The plan would be for patient to obtain a radiation oncology referral if no surgery is  offered.  Patient and his wife state that there has been no communication with the [facility] surgical oncology team as of yet.  Patient continues to complain of moderate abdominal bloating and distention; but denies any nausea/vomiting or diarrhea.  He also denies any constipation recently.  He denies any recent fevers or chills.  Patient states that his biliary drain is capped at the present time; but he does have additional supplies at home if he needs the abdomen again this week.  Patient also reports chronic right foot drop that he has had since the onset of his symptoms, just prior to his admission to  the hospital.  He denies any other new neurological symptoms.  On exam today.  Patient appears extremely fatigued and weak.  He appears pale; with just a trace of scleral icterus bilaterally.  Afternoon, mild to moderately firm; but nontender.  Labs obtained today reveal a WBC of 7.5, ANC 5.3, hemoglobin 13.2, platelet count 89.  Patient also continues with hyponatremia; but slightly improved.  Bilirubin elevated, but stable at 2.17.  Alkaline phosphatase has increased from 132 up to 191.  Liver enzymes remain elevated, but stable.  Patient's albumen continues low at 2.1.  Discussed option of giving IV fluid rehydration today; but patient refused.  He stated that he felt he could push fluids./Gatorade at home. __________________________________________  UPDATE: Patient presented back to the [facility] today as a walk-in with complaint of increased generalized abdominal discomfort and bloating.  He states he has been draining his biliary drain greater than 4 times per day.  At this current time.-Patient actually has the drain free-flowing into his drain back.  Patient complains of feeling increasingly weak and dehydrated.  He denies any recent fevers or chills.  Exam today revealed increased fatigue and worsening weakness.  Labs obtained today revealed a WBC of 10.2, ANC 8.5, hemoglobin 13.4,  platelet count 101.  Vital signs essentially stable; with temperature 98.0.  Given patient's continual biliary drainage and worsening dehydration-patient will be admitted via the hospitalist program.  Gave brief report and history to hospitalist, Dr. Florencia Reasons prior to the patient being transported to the floor for direct admit via wheelchair.  Per the cancer Center nurse.  Also, patient from the has not heard back from the [facility] surgical oncologist for a urgent referral appointment as of yet.  Spoke today with Dr. Burr Medico; and she advised that patient may very well need to consider starting chemotherapy here at the [facility] next week-if patient does not hear back from [facility] or [facility] regarding a referral appointment.    Patient is scheduled to return on 10/18/2015 for labs and follow-up visit.        Hyponatremia Patient continues dehydrated with a sodium down to 132.  Patient received approximately 250 mL's normal saline IV fluid rehydration while at the cancer Center today.  Patient will be direct admitted for further hydration and evaluation/management.  Hyperbilirubinemia Bilirubin is elevated once again from 2.17 up to 3.2 today.  Patient is experiencing increased fluid output from his biliary drain as well today.  Hyperphosphatemia Alkaline phosphatase remains elevated at 159 today.  We'll continue to monitor closely.  Most likely, this is secondary to patient's disease.  Hypoalbuminemia due to protein-calorie malnutrition ([place]) Albumen has decreased from 2.1 down to 2.0.  Patient was encouraged to push protein in his diet is much as possible.  Hyperkalemia Potassium has increased to 5.5.  Confirmed the patient is not taking any potassium supplements.  Most likely, hyperkalemia secondary to dehydration.  Will continue to monitor closely.   Had a brief discussion with the patient regarding his CODE STATUS.  Patient confirmed that he would like to remain a full code.  He  states that he would like to discuss any code options with his family prior to making a decision.  Patient stated understanding of all instructions; and was in agreement with this plan of care. The patient knows to call the clinic with any problems, questions or concerns.   Review/collaboration with Dr. Burr Medico regarding all aspects of patient's visit today.   Total time spent with patient  was 40 minutes;  with greater than 75 percent of that time spent in face to face counseling regarding patient's symptoms,  and coordination of care and follow up.  Disclaimer:This dictation was prepared with Dragon/digital dictation along with [employer]. Any transcriptional errors that result from this process are unintentional.  Drue Second, NP 09/17/2015

## 2015-10-11 NOTE — Assessment & Plan Note (Signed)
Patient continues dehydrated with a sodium down to 132.  Patient received approximately 250 mL's normal saline IV fluid rehydration while at the cancer Center today.  Patient will be direct admitted for further hydration and evaluation/management.

## 2015-10-11 NOTE — Assessment & Plan Note (Signed)
Alkaline phosphatase remains elevated at 159 today.  We'll continue to monitor closely.  Most likely, this is secondary to patient's disease.

## 2015-10-11 NOTE — Telephone Encounter (Signed)
S/w patsy to have pt come in for labs about 1 and fluids in [facility] about 130.

## 2015-10-11 NOTE — Telephone Encounter (Signed)
Called pt to discuss possible upcoming treatment if unable to get in to see some one there soon.  Informed that we were setting him up for chemo on fri 10/18/15 at 8 am but he would also need a chemo class.  He reports that he was having some abdominal swelling & hurting & he therefore reconnected his billary drainage bag this am @ 5 am.  He states that it hasn't been full every time but has emptied it 4 times.  He reports that he was told that if he has to connect drainage bag again he should probably have some IVF.  Discussed with Juliann Pulse RN/Cyndee Berniece Salines NP & pt notified to come in at 1 pm for labs & 1:30 pm for IVF.  He states that he has a new insurance card that will be effective next week & will bring it with him today.  We will make sure that this info also gets to [facility].  This message will be routed to Dr Burr Medico.

## 2015-10-11 NOTE — Assessment & Plan Note (Signed)
Patient was admitted from 09/27/2015 through 10/01/2015 with newly diagnosed cholangiocarcinoma.  An external biliary drain was placed while admitted.  Patient is currently awaiting his initial appointment with [facility] surgical oncology for further evaluation and management.  The plan would be for patient to obtain a radiation oncology referral if no surgery is offered.  Patient and his wife state that there has been no communication with the [facility] surgical oncology team as of yet.  Patient continues to complain of moderate abdominal bloating and distention; but denies any nausea/vomiting or diarrhea.  He also denies any constipation recently.  He denies any recent fevers or chills.  Patient states that his biliary drain is capped at the present time; but he does have additional supplies at home if he needs the abdomen again this week.  Patient also reports chronic right foot drop that he has had since the onset of his symptoms, just prior to his admission to the hospital.  He denies any other new neurological symptoms.  On exam today.  Patient appears extremely fatigued and weak.  He appears pale; with just a trace of scleral icterus bilaterally.  Afternoon, mild to moderately firm; but nontender.  Labs obtained today reveal a WBC of 7.5, ANC 5.3, hemoglobin 13.2, platelet count 89.  Patient also continues with hyponatremia; but slightly improved.  Bilirubin elevated, but stable at 2.17.  Alkaline phosphatase has increased from 132 up to 191.  Liver enzymes remain elevated, but stable.  Patient's albumen continues low at 2.1.  Discussed option of giving IV fluid rehydration today; but patient refused.  He stated that he felt he could push fluids./Gatorade at home. __________________________________________  UPDATE: Patient presented back to the [facility] today as a walk-in with complaint of increased generalized abdominal discomfort and bloating.  He states he has been draining his biliary drain  greater than 4 times per day.  At this current time.-Patient actually has the drain free-flowing into his drain back.  Patient complains of feeling increasingly weak and dehydrated.  He denies any recent fevers or chills.  Exam today revealed increased fatigue and worsening weakness.  Labs obtained today revealed a WBC of 10.2, ANC 8.5, hemoglobin 13.4, platelet count 101.  Vital signs essentially stable; with temperature 98.0.  Given patient's continual biliary drainage and worsening dehydration-patient will be admitted via the hospitalist program.  Gave brief report and history to hospitalist, Dr. Florencia Reasons prior to the patient being transported to the floor for direct admit via wheelchair.  Per the cancer Center nurse.  Also, patient from the has not heard back from the [facility] surgical oncologist for a urgent referral appointment as of yet.  Spoke today with Dr. Burr Medico; and she advised that patient may very well need to consider starting chemotherapy here at the [place] next week-if patient does not hear back from [facility] or [place] regarding a referral appointment.    Patient is scheduled to return on 10/18/2015 for labs and follow-up visit.

## 2015-10-12 ENCOUNTER — Inpatient Hospital Stay (HOSPITAL_COMMUNITY): Payer: BLUE CROSS/BLUE SHIELD

## 2015-10-12 ENCOUNTER — Encounter (HOSPITAL_COMMUNITY): Payer: Self-pay

## 2015-10-12 DIAGNOSIS — K838 Other specified diseases of biliary tract: Secondary | ICD-10-CM

## 2015-10-12 LAB — COMPREHENSIVE METABOLIC PANEL
ALBUMIN: 1.9 g/dL — AB (ref 3.5–5.0)
ALT: 33 U/L (ref 17–63)
ALT: 28 U/L (ref 17–63)
AST: 41 U/L (ref 15–41)
AST: 36 U/L (ref 15–41)
Albumin: 1.8 g/dL — ABNORMAL LOW (ref 3.5–5.0)
Alkaline Phosphatase: 115 U/L (ref 38–126)
Alkaline Phosphatase: 106 U/L (ref 38–126)
Anion gap: 9 (ref 5–15)
Anion gap: 6 (ref 5–15)
BUN: 47 mg/dL — ABNORMAL HIGH (ref 6–20)
BUN: 52 mg/dL — AB (ref 6–20)
CALCIUM: 9.6 mg/dL (ref 8.9–10.3)
CHLORIDE: 96 mmol/L — AB (ref 101–111)
CO2: 24 mmol/L (ref 22–32)
CO2: 25 mmol/L (ref 22–32)
CREATININE: 1.01 mg/dL (ref 0.61–1.24)
CREATININE: 1.3 mg/dL — AB (ref 0.61–1.24)
Calcium: 9.2 mg/dL (ref 8.9–10.3)
Chloride: 99 mmol/L — ABNORMAL LOW (ref 101–111)
GFR calc Af Amer: >60 mL/min (ref >60)
GFR calc non Af Amer: >60 mL/min (ref >60)
GFR, EST NON AFRICAN AMERICAN: 59 mL/min — AB (ref >60)
Glucose, Bld: 122 mg/dL — ABNORMAL HIGH (ref 65–99)
Glucose, Bld: 110 mg/dL — ABNORMAL HIGH (ref 65–99)
POTASSIUM: 4.5 mmol/L (ref 3.5–5.1)
POTASSIUM: 4.7 mmol/L (ref 3.5–5.1)
SODIUM: 127 mmol/L — AB (ref 135–145)
Sodium: 132 mmol/L — ABNORMAL LOW (ref 135–145)
TOTAL PROTEIN: 6.7 g/dL (ref 6.5–8.1)
Total Bilirubin: 2.8 mg/dL — ABNORMAL HIGH (ref 0.3–1.2)
Total Bilirubin: 2.3 mg/dL — ABNORMAL HIGH (ref 0.3–1.2)
Total Protein: 6.4 g/dL — ABNORMAL LOW (ref 6.5–8.1)

## 2015-10-12 LAB — CBC
HEMATOCRIT: 34.6 % — AB (ref 39.0–52.0)
Hemoglobin: 12 g/dL — ABNORMAL LOW (ref 13.0–17.0)
MCH: 35.3 pg — ABNORMAL HIGH (ref 26.0–34.0)
MCHC: 34.7 g/dL (ref 30.0–36.0)
MCV: 101.8 fL — ABNORMAL HIGH (ref 78.0–100.0)
PLATELETS: 88 10*3/uL — AB (ref 150–400)
RBC: 3.4 MIL/uL — AB (ref 4.22–5.81)
RDW: 13.6 % (ref 11.5–15.5)
WBC: 8.9 10*3/uL (ref 4.0–10.5)

## 2015-10-12 LAB — MRSA PCR SCREENING: MRSA BY PCR: POSITIVE — AB

## 2015-10-12 LAB — BODY FLUID CELL COUNT WITH DIFFERENTIAL
EOS FL: 0 %
Lymphs, Fluid: 4 %
MONOCYTE-MACROPHAGE-SEROUS FLUID: 4 % — AB (ref 50–90)
Neutrophil Count, Fluid: 92 % — ABNORMAL HIGH (ref 0–25)
Total Nucleated Cell Count, Fluid: 13470 cu mm — ABNORMAL HIGH (ref 0–1000)

## 2015-10-12 LAB — PROTEIN, BODY FLUID

## 2015-10-12 LAB — GLUCOSE, SEROUS FLUID: Glucose, Fluid: 107 mg/dL

## 2015-10-12 MED ORDER — VANCOMYCIN HCL IN DEXTROSE 1-5 GM/200ML-% IV SOLN
1000.0000 mg | Freq: Two times a day (BID) | INTRAVENOUS | Status: DC
  Administered 2015-10-13: 1000 mg via INTRAVENOUS
  Filled 2015-10-12: qty 200

## 2015-10-12 MED ORDER — VANCOMYCIN HCL 10 G IV SOLR
1500.0000 mg | Freq: Once | INTRAVENOUS | Status: AC
  Administered 2015-10-12: 1500 mg via INTRAVENOUS
  Filled 2015-10-12: qty 1500

## 2015-10-12 MED ORDER — DEXTROSE 5 % IV SOLN
2.0000 g | INTRAVENOUS | Status: DC
  Administered 2015-10-12 – 2015-10-13 (×2): 2 g via INTRAVENOUS
  Filled 2015-10-12 (×2): qty 2

## 2015-10-12 NOTE — Procedures (Signed)
   US guided RLQ paracentesis  4.2 liters yellow fluid Sent for labs per MD Tolerated well

## 2015-10-12 NOTE — Progress Notes (Signed)
ANTIBIOTIC CONSULT NOTE - INITIAL  Pharmacy Consult for ceftriaxone Indication:intr abdominal infection  No Known Allergies  Patient Measurements: Height: 5\' 9"  (175.3 cm) Weight: 179 lb (81.194 kg) IBW/kg (Calculated) : 70.7   Vital Signs: Temp: 98 F (36.7 C) (12/31 1235) Temp Source: Oral (12/31 1235) BP: 90/64 mmHg (12/31 1235) Pulse Rate: 102 (12/31 1235) Intake/Output from previous day: 12/30 0701 - 12/31 0700 In: 1600 [I.V.:600] Out: 2750 [Urine:300; Drains:2450] Intake/Output from this shift: Total I/O In: 480 [P.O.:480] Out: 1075 [Drains:1075]  Labs:  Recent Labs  10/02/2015 1325 09/28/2015 1925 10/12/15 0416  WBC 10.2  --  8.9  HGB 13.4  --  12.0*  PLT 101*  --  88*  CREATININE 1.2 1.10 1.01   Estimated Creatinine Clearance: 79.7 mL/min (by C-G formula based on Cr of 1.01). No results for input(s): VANCOTROUGH, VANCOPEAK, VANCORANDOM, GENTTROUGH, GENTPEAK, GENTRANDOM, TOBRATROUGH, TOBRAPEAK, TOBRARND, AMIKACINPEAK, AMIKACINTROU, AMIKACIN in the last 72 hours.   Microbiology: Recent Results (from the past 720 hour(s))  Body fluid culture     Status: None   Collection Time: 09/15/15 10:34 AM  Result Value Ref Range Status   Specimen Description PERITONEAL FLUID  Final   Special Requests NONE  Final   Gram Stain   Final    FEW WBC PRESENT, PREDOMINANTLY MONONUCLEAR NO ORGANISMS SEEN Results Called to: [place] [address] SEVERAL TIMES NO ANSWER [id number] [address] [place]    Culture   Final    NO GROWTH 3 DAYS Performed at [facility]    Report Status 09/18/2015 FINAL  Final  Culture, Urine     Status: None   Collection Time: 09/16/15 12:34 PM  Result Value Ref Range Status   Specimen Description URINE, CLEAN CATCH  Final   Special Requests NONE  Final   Culture   Final    NO GROWTH 1 DAY Performed at [facility]    Report Status 09/17/2015 FINAL  Final  Body fluid culture     Status: None   Collection Time: 09/17/15  5:12 PM  Result Value  Ref Range Status   Specimen Description BILE  Final   Special Requests Normal  Final   Gram Stain   Final    RARE WBC PRESENT, PREDOMINANTLY MONONUCLEAR NO ORGANISMS SEEN    Culture   Final    FEW CANDIDA ALBICANS CRITICAL RESULT CALLED TO, READ BACK BY AND VERIFIED WITH: R BALDWIN 09/18/15 @ 13 M VESTAL Performed at [facility]    Report Status 09/22/2015 FINAL  Final  Culture, body fluid-bottle     Status: None   Collection Time: 09/19/15 11:53 AM  Result Value Ref Range Status   Specimen Description FLUID BILE  Final   Special Requests NONE  Final   Gram Stain   Final    GRAM POSITIVE RODS IN BOTH AEROBIC AND ANAEROBIC BOTTLES CRITICAL RESULT CALLED TO, READ BACK BY AND VERIFIED WITH: MEREDITH @0509  09/20/15 MKELLY    Culture   Final    MULTIPLE ORGANISMS PRESENT, NONE PREDOMINANT Performed at [facility]    Report Status 09/22/2015 FINAL  Final  Gram stain     Status: None   Collection Time: 09/19/15 11:53 AM  Result Value Ref Range Status   Specimen Description FLUID BILE  Final   Special Requests NONE  Final   Gram Stain   Final    NO WBC SEEN FEW GRAM POSITIVE RODS RARE GRAM NEGATIVE RODS RARE BUDDING YEAST SEEN Performed at [facility]    Report Status 09/19/2015 FINAL  Final  Urine culture     Status: None   Collection Time: 09/28/15  7:32 AM  Result Value Ref Range Status   Specimen Description URINE, CLEAN CATCH  Final   Special Requests NONE  Final   Culture   Final    NO GROWTH 1 DAY Performed at [facility]    Report Status 09/29/2015 FINAL  Final  Culture, body fluid-bottle     Status: None (Preliminary result)   Collection Time: 10/12/15  3:55 PM  Result Value Ref Range Status   Specimen Description PERITONEAL  Final   Special Requests   Final    BOTTLES DRAWN AEROBIC AND ANAEROBIC 31ml Performed at [facility]    Culture PENDING  Incomplete   Report Status PENDING  Incomplete    Medical  History: Past Medical History  Diagnosis Date  . Liver mass   . Hyponatremia 09/27/2015  . Hepatic cirrhosis [facility])    Assessment: 59 y.o. 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  Goal of Therapy:  Eradication of infection   Plan:   Ceftriaxone 2gm IV q24h  Pharmacy will sign off as current dose is appropriate for indication and renal function  Re-consult if further assistance needed  Dolly Rias RPh 10/12/2015, 4:40 PM Pager [phone]    Kavin Leech E 10/12/2015,4:35 PM

## 2015-10-12 NOTE — Progress Notes (Signed)
ANTIBIOTIC CONSULT NOTE - INITIAL  Pharmacy Consult for vancomycin Indication: gram + cocci in clusters in peritoneal fluid  No Known Allergies  Patient Measurements: Height: 5\' 9"  (175.3 cm) Weight: 179 lb (81.194 kg) IBW/kg (Calculated) : 70.7   Vital Signs: Temp: 98 F (36.7 C) (12/31 1235) Temp Source: Oral (12/31 1235) BP: 90/64 mmHg (12/31 1235) Pulse Rate: 102 (12/31 1235) Intake/Output from previous day: 12/30 0701 - 12/31 0700 In: 1600 [I.V.:600] Out: 2750 [Urine:300; Drains:2450] Intake/Output from this shift: Total I/O In: 1080 [P.O.:480; I.V.:600] Out: 1275 [Drains:1275]  Labs:  Recent Labs  10/05/2015 1325 09/25/2015 1925 10/12/15 0416 10/12/15 1635  WBC 10.2  --  8.9  --   HGB 13.4  --  12.0*  --   PLT 101*  --  88*  --   CREATININE 1.2 1.10 1.01 1.30*   Estimated Creatinine Clearance: 61.9 mL/min (by C-G formula based on Cr of 1.3). No results for input(s): VANCOTROUGH, VANCOPEAK, VANCORANDOM, GENTTROUGH, GENTPEAK, GENTRANDOM, TOBRATROUGH, TOBRAPEAK, TOBRARND, AMIKACINPEAK, AMIKACINTROU, AMIKACIN in the last 72 hours.   Microbiology: Recent Results (from the past 720 hour(s))  Body fluid culture     Status: None   Collection Time: 09/15/15 10:34 AM  Result Value Ref Range Status   Specimen Description PERITONEAL FLUID  Final   Special Requests NONE  Final   Gram Stain   Final    FEW WBC PRESENT, PREDOMINANTLY MONONUCLEAR NO ORGANISMS SEEN Results Called to: [place] [address] SEVERAL TIMES NO ANSWER [id number] [place]    Culture   Final    NO GROWTH 3 DAYS Performed at [facility]    Report Status 09/18/2015 FINAL  Final  Culture, Urine     Status: None   Collection Time: 09/16/15 12:34 PM  Result Value Ref Range Status   Specimen Description URINE, CLEAN CATCH  Final   Special Requests NONE  Final   Culture   Final    NO GROWTH 1 DAY Performed at [facility]    Report Status 09/17/2015 FINAL  Final  Body fluid culture      Status: None   Collection Time: 09/17/15  5:12 PM  Result Value Ref Range Status   Specimen Description BILE  Final   Special Requests Normal  Final   Gram Stain   Final    RARE WBC PRESENT, PREDOMINANTLY MONONUCLEAR NO ORGANISMS SEEN    Culture   Final    FEW CANDIDA ALBICANS CRITICAL RESULT CALLED TO, READ BACK BY AND VERIFIED WITH: R BALDWIN 09/18/15 @ 82 M VESTAL Performed at [facility]    Report Status 09/22/2015 FINAL  Final  Culture, body fluid-bottle     Status: None   Collection Time: 09/19/15 11:53 AM  Result Value Ref Range Status   Specimen Description FLUID BILE  Final   Special Requests NONE  Final   Gram Stain   Final    GRAM POSITIVE RODS IN BOTH AEROBIC AND ANAEROBIC BOTTLES CRITICAL RESULT CALLED TO, READ BACK BY AND VERIFIED WITH: MEREDITH @0509  09/20/15 MKELLY    Culture   Final    MULTIPLE ORGANISMS PRESENT, NONE PREDOMINANT Performed at [facility]    Report Status 09/22/2015 FINAL  Final  Gram stain     Status: None   Collection Time: 09/19/15 11:53 AM  Result Value Ref Range Status   Specimen Description FLUID BILE  Final   Special Requests NONE  Final   Gram Stain   Final  NO WBC SEEN FEW GRAM POSITIVE RODS RARE GRAM NEGATIVE RODS RARE BUDDING YEAST SEEN Performed at [facility]    Report Status 09/19/2015 FINAL  Final  Urine culture     Status: None   Collection Time: 09/28/15  7:32 AM  Result Value Ref Range Status   Specimen Description URINE, CLEAN CATCH  Final   Special Requests NONE  Final   Culture   Final    NO GROWTH 1 DAY Performed at [facility]    Report Status 09/29/2015 FINAL  Final  Culture, body fluid-bottle     Status: None (Preliminary result)   Collection Time: 10/12/15  3:55 PM  Result Value Ref Range Status   Specimen Description PERITONEAL  Final   Special Requests   Final    BOTTLES DRAWN AEROBIC AND ANAEROBIC 40ml Performed at [facility]    Culture PENDING   Incomplete   Report Status PENDING  Incomplete  Gram stain     Status: None (Preliminary result)   Collection Time: 10/12/15  3:55 PM  Result Value Ref Range Status   Specimen Description PERITONEAL  Final   Special Requests NONE  Final   Gram Stain   Final    WBC PRESENT,BOTH PMN AND MONONUCLEAR GRAM POSITIVE COCCI IN CLUSTERS CRITICAL RESULT CALLED TO, READ BACK BY AND VERIFIED WITH: R.BALDWIN,RN 10/12/15 @1744  BY V.WILKINS CONFIRMED BY K.WOOTEN Performed at [facility]    Report Status PENDING  Incomplete    Medical History: Past Medical History  Diagnosis Date  . Liver mass   . Hyponatremia 09/27/2015  . Hepatic cirrhosis [facility])     Assessment: 59 y.o. 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 Peritoneal fluid resulting with gram + cocci in clusters.  Goal of Therapy:  Vancomycin trough level 15-20 mcg/ml  Plan:  Vancomycin 1500mg  IV x 1 then Vancomycin 1000mg  IV q12h Follow renal function vanc trough at steady state as needed  [place] 10/12/2015, 6:10 PM Pager [phone]

## 2015-10-12 NOTE — Progress Notes (Signed)
CRITICAL VALUE ALERT  Critical value received:  Gram + cocci in clusters peritoneal fluid   Date of notification: 10/12/15  Time of notification:  5:30 p  Critical value read back: yes  Nurse who received alert:  Shelton Silvas, RN   MD notified (1st page):  Dr. Florencia Reasons   Time of first page:  6:00 p.m.   MD notified (2nd page):  Time of second page:  Responding MD:  Dr. Erlinda Hong   Time MD responded:  6:02 p.m.

## 2015-10-12 NOTE — Progress Notes (Signed)
PROGRESS NOTE  Eric Schroeder [id number] DOB: [birth year] DOA: 10/10/2015 PCP: No PCP Per Patient  HPI/Recap of past 24 hours:  Returned from paracentesis, reported feeling better, denies pain, no fever, no n/v. No confusion  Assessment/Plan: Active Problems:   Dehydration  Dehydration/hyponatremia/increased biliary drain: ivf, salt tabs, discussed with Inter[facility]netional radiology Dr. Barbie Banner who recommend that patient may need paracentesis more frequent and intermittent capping of biliary drain to prevent dehydration, over all poor prognosis due to large obstructing cbd cancer with background cirrhosis.  Ascites: s/p US guided paracentesis on 12/31, fluids studies cell count with elevated neutrophil> 500, start rocephin for sbp  Malnutrition: nutrition supplement  Cholangiocarcinoma: with elevated lft and resulted mild elevated INR, defer to oncology. I have discussed with patient about overall poor prognosis if the tumor not able to be resected or not able to shrink.   DVT prophylaxis: scd  Consultants:  [facility] Oncology (dr Marin Olp)  Code Status: full   Family Communication: Patient   Disposition Plan: pending   Procedures:  US guided paracentesis on 12/31 with removal 4.2 liter yellow fluids  Antibiotics:  Rocephin from 12/31   Objective: BP 90/64 mmHg  Pulse 102  Temp(Src) 98 F (36.7 C) (Oral)  Resp 18  Ht 5\' 9"  (1.753 m)  Wt 179 lb (81.194 kg)  BMI 26.42 kg/m2  SpO2 100%  Intake/Output Summary (Last 24 hours) at 10/12/15 1605 Last data filed at 10/12/15 1236  Gross per 24 hour  Intake   2080 ml  Output   3825 ml  Net  -1745 ml   Filed Weights   09/15/2015 1721  Weight: 179 lb (81.194 kg)    Exam:   General: Thin frail, mild jaundice, NAD  Eyes: PERRL  ENT: unremarkable  Neck: supple, no JVD  Cardiovascular: RRR  Respiratory: CTABL  Abdomen: less distended, no significant tender, no rebound, positive bowel sounds, +biliary drain    Skin: no rash  Musculoskeletal: No edema  Psychiatric: calm/cooperative  Neurologic: no focal findings , chronic right foot drop  Data Reviewed: Basic Metabolic Panel:  Recent Labs Lab 10/08/15 1121 10/03/2015 1325 09/27/2015 1925 10/12/15 0416  NA 132* 132* 129* 132*  K 4.7 5.5* 4.7 4.5  CL  --   --  97* 99*  CO2 23 23 24 24   GLUCOSE 173* 113 108* 122*  BUN 18.5 38.5* 42* 47*  CREATININE 0.8 1.2 1.10 1.01  CALCIUM 9.0 10.1 9.6 9.6   Liver Function Tests:  Recent Labs Lab 10/08/15 1121 09/15/2015 1325 10/12/15 0416  AST 88* 49* 41  ALT 54 39 33  ALKPHOS 191* 159* 115  BILITOT 2.17* 3.20* 2.8*  PROT 7.1 7.5 6.7  ALBUMIN 2.1* 2.0* 1.9*   No results for input(s): LIPASE, AMYLASE in the last 168 hours. No results for input(s): AMMONIA in the last 168 hours. CBC:  Recent Labs Lab 10/08/15 1121 09/14/2015 1325 10/12/15 0416  WBC 7.5 10.2 8.9  NEUTROABS 5.3 8.5*  --   HGB 13.2 13.4 12.0*  HCT 38.0* 40.2 34.6*  MCV 102.7* 107.5* 101.8*  PLT 89* 101* 88*   Cardiac Enzymes:   No results for input(s): CKTOTAL, CKMB, CKMBINDEX, TROPONINI in the last 168 hours. BNP (last 3 results) No results for input(s): BNP in the last 8760 hours.  ProBNP (last 3 results) No results for input(s): PROBNP in the last 8760 hours.  CBG: No results for input(s): GLUCAP in the last 168 hours.  No results found for this or any previous  visit (from the past 240 hour(s)).   Studies: US Paracentesis  10/12/2015  INDICATION: ascites cholangiocarcinoma EXAM: ULTRASOUND-GUIDED PARACENTESIS COMPARISON:  Previous paracentesis MEDICATIONS: 10 cc 1% lidocaine COMPLICATIONS: None immediate TECHNIQUE: Informed written consent was obtained from the patient after a discussion of the risks, benefits and alternatives to treatment. A timeout was performed prior to the initiation of the procedure. Initial ultrasound scanning demonstrates a large amount of ascites within the right lower abdominal  quadrant. The right lower abdomen was prepped and draped in the usual sterile fashion. 1% lidocaine with epinephrine was used for local anesthesia. Under direct ultrasound guidance, a 19 gauge, 7-cm, Yueh catheter was introduced. An ultrasound image was saved for documentation purposed. The paracentesis was performed. The catheter was removed and a dressing was applied. The patient tolerated the procedure well without immediate post procedural complication. FINDINGS: A total of approximately 4.2 liters of yellow fluid was removed. Samples were sent to the laboratory as requested by the clinical team. IMPRESSION: Successful ultrasound-guided paracentesis yielding 4.2 liters of peritoneal fluid. Read By:  Lavonia Drafts [facility] Electronically Signed   By: Marybelle Killings M.D.   On: 10/12/2015 12:52    Scheduled Meds: . sodium chloride  3 mL Intravenous Q12H  . sodium chloride  1 g Oral TID WC    Continuous Infusions: . sodium chloride 75 mL/hr at 09/15/2015 2303     Time spent: 72mins  Areeba Sulser MD, PhD  [facility] Pager [phone]. If 7PM-7AM, please contact night-coverage at [email or web address], password [facility] 10/12/2015, 4:05 PM  LOS: 1 day

## 2015-10-13 DIAGNOSIS — M21371 Foot drop, right foot: Secondary | ICD-10-CM | POA: Diagnosis present

## 2015-10-13 DIAGNOSIS — I480 Paroxysmal atrial fibrillation: Secondary | ICD-10-CM | POA: Diagnosis not present

## 2015-10-13 DIAGNOSIS — K652 Spontaneous bacterial peritonitis: Secondary | ICD-10-CM | POA: Diagnosis present

## 2015-10-13 DIAGNOSIS — I8501 Esophageal varices with bleeding: Secondary | ICD-10-CM | POA: Diagnosis present

## 2015-10-13 DIAGNOSIS — K767 Hepatorenal syndrome: Secondary | ICD-10-CM | POA: Diagnosis present

## 2015-10-13 DIAGNOSIS — C221 Intrahepatic bile duct carcinoma: Secondary | ICD-10-CM | POA: Diagnosis present

## 2015-10-13 DIAGNOSIS — Z8505 Personal history of malignant neoplasm of liver: Secondary | ICD-10-CM | POA: Diagnosis not present

## 2015-10-13 DIAGNOSIS — E871 Hypo-osmolality and hyponatremia: Secondary | ICD-10-CM | POA: Diagnosis present

## 2015-10-13 DIAGNOSIS — D6959 Other secondary thrombocytopenia: Secondary | ICD-10-CM | POA: Diagnosis present

## 2015-10-13 DIAGNOSIS — Z6827 Body mass index (BMI) 27.0-27.9, adult: Secondary | ICD-10-CM | POA: Diagnosis not present

## 2015-10-13 DIAGNOSIS — K831 Obstruction of bile duct: Secondary | ICD-10-CM | POA: Diagnosis present

## 2015-10-13 DIAGNOSIS — R578 Other shock: Secondary | ICD-10-CM | POA: Diagnosis not present

## 2015-10-13 DIAGNOSIS — I9581 Postprocedural hypotension: Secondary | ICD-10-CM | POA: Diagnosis not present

## 2015-10-13 DIAGNOSIS — B1920 Unspecified viral hepatitis C without hepatic coma: Secondary | ICD-10-CM | POA: Diagnosis present

## 2015-10-13 DIAGNOSIS — E860 Dehydration: Secondary | ICD-10-CM | POA: Diagnosis present

## 2015-10-13 DIAGNOSIS — K7460 Unspecified cirrhosis of liver: Secondary | ICD-10-CM | POA: Diagnosis present

## 2015-10-13 DIAGNOSIS — C249 Malignant neoplasm of biliary tract, unspecified: Secondary | ICD-10-CM | POA: Diagnosis not present

## 2015-10-13 DIAGNOSIS — E872 Acidosis: Secondary | ICD-10-CM | POA: Diagnosis present

## 2015-10-13 DIAGNOSIS — B9562 Methicillin resistant Staphylococcus aureus infection as the cause of diseases classified elsewhere: Secondary | ICD-10-CM | POA: Diagnosis present

## 2015-10-13 DIAGNOSIS — D62 Acute posthemorrhagic anemia: Secondary | ICD-10-CM | POA: Diagnosis present

## 2015-10-13 DIAGNOSIS — D539 Nutritional anemia, unspecified: Secondary | ICD-10-CM | POA: Diagnosis not present

## 2015-10-13 DIAGNOSIS — N179 Acute kidney failure, unspecified: Secondary | ICD-10-CM | POA: Diagnosis present

## 2015-10-13 DIAGNOSIS — G9340 Encephalopathy, unspecified: Secondary | ICD-10-CM | POA: Diagnosis present

## 2015-10-13 DIAGNOSIS — K920 Hematemesis: Secondary | ICD-10-CM | POA: Diagnosis not present

## 2015-10-13 DIAGNOSIS — Z66 Do not resuscitate: Secondary | ICD-10-CM | POA: Diagnosis not present

## 2015-10-13 DIAGNOSIS — R188 Other ascites: Secondary | ICD-10-CM | POA: Diagnosis present

## 2015-10-13 DIAGNOSIS — Z79899 Other long term (current) drug therapy: Secondary | ICD-10-CM | POA: Diagnosis not present

## 2015-10-13 DIAGNOSIS — E875 Hyperkalemia: Secondary | ICD-10-CM | POA: Diagnosis present

## 2015-10-13 DIAGNOSIS — R627 Adult failure to thrive: Secondary | ICD-10-CM | POA: Diagnosis present

## 2015-10-13 DIAGNOSIS — E43 Unspecified severe protein-calorie malnutrition: Secondary | ICD-10-CM | POA: Diagnosis present

## 2015-10-13 DIAGNOSIS — Z515 Encounter for palliative care: Secondary | ICD-10-CM | POA: Diagnosis present

## 2015-10-13 LAB — URINALYSIS, ROUTINE W REFLEX MICROSCOPIC
GLUCOSE, UA: NEGATIVE mg/dL
Hgb urine dipstick: NEGATIVE
KETONES UR: 15 mg/dL — AB
NITRITE: POSITIVE — AB
PH: 5 (ref 5.0–8.0)
Protein, ur: NEGATIVE mg/dL
SPECIFIC GRAVITY, URINE: 1.027 (ref 1.005–1.030)

## 2015-10-13 LAB — COMPREHENSIVE METABOLIC PANEL
ALBUMIN: 1.5 g/dL — AB (ref 3.5–5.0)
ALBUMIN: 1.5 g/dL — AB (ref 3.5–5.0)
ALK PHOS: 94 U/L (ref 38–126)
ALK PHOS: 90 U/L (ref 38–126)
ALT: 24 U/L (ref 17–63)
ALT: 24 U/L (ref 17–63)
ANION GAP: 9 (ref 5–15)
ANION GAP: 7 (ref 5–15)
AST: 33 U/L (ref 15–41)
AST: 37 U/L (ref 15–41)
BILIRUBIN TOTAL: 1.9 mg/dL — AB (ref 0.3–1.2)
BUN: 56 mg/dL — AB (ref 6–20)
BUN: 62 mg/dL — ABNORMAL HIGH (ref 6–20)
CALCIUM: 9.1 mg/dL (ref 8.9–10.3)
CALCIUM: 8.7 mg/dL — AB (ref 8.9–10.3)
CO2: 22 mmol/L (ref 22–32)
CO2: 22 mmol/L (ref 22–32)
CREATININE: 1.95 mg/dL — AB (ref 0.61–1.24)
Chloride: 96 mmol/L — ABNORMAL LOW (ref 101–111)
Chloride: 99 mmol/L — ABNORMAL LOW (ref 101–111)
Creatinine, Ser: 2.27 mg/dL — ABNORMAL HIGH (ref 0.61–1.24)
GFR calc Af Amer: 42 mL/min — ABNORMAL LOW (ref >60)
GFR calc non Af Amer: 36 mL/min — ABNORMAL LOW (ref >60)
GFR calc non Af Amer: 30 mL/min — ABNORMAL LOW (ref >60)
GFR, EST AFRICAN AMERICAN: 35 mL/min — AB (ref >60)
GLUCOSE: 117 mg/dL — AB (ref 65–99)
GLUCOSE: 113 mg/dL — AB (ref 65–99)
POTASSIUM: 4.1 mmol/L (ref 3.5–5.1)
Potassium: 4.4 mmol/L (ref 3.5–5.1)
SODIUM: 127 mmol/L — AB (ref 135–145)
SODIUM: 128 mmol/L — AB (ref 135–145)
TOTAL PROTEIN: 5.7 g/dL — AB (ref 6.5–8.1)
TOTAL PROTEIN: 5.5 g/dL — AB (ref 6.5–8.1)
Total Bilirubin: 2 mg/dL — ABNORMAL HIGH (ref 0.3–1.2)

## 2015-10-13 LAB — CBC
HCT: 33.7 % — ABNORMAL LOW (ref 39.0–52.0)
HEMATOCRIT: 32 % — AB (ref 39.0–52.0)
HEMOGLOBIN: 12 g/dL — AB (ref 13.0–17.0)
HEMOGLOBIN: 11.4 g/dL — AB (ref 13.0–17.0)
MCH: 35.8 pg — AB (ref 26.0–34.0)
MCH: 35.7 pg — AB (ref 26.0–34.0)
MCHC: 35.6 g/dL (ref 30.0–36.0)
MCHC: 35.6 g/dL (ref 30.0–36.0)
MCV: 100.6 fL — ABNORMAL HIGH (ref 78.0–100.0)
MCV: 100.3 fL — ABNORMAL HIGH (ref 78.0–100.0)
PLATELETS: 87 10*3/uL — AB (ref 150–400)
Platelets: 82 10*3/uL — ABNORMAL LOW (ref 150–400)
RBC: 3.35 MIL/uL — AB (ref 4.22–5.81)
RBC: 3.19 MIL/uL — ABNORMAL LOW (ref 4.22–5.81)
RDW: 13.7 % (ref 11.5–15.5)
RDW: 13.7 % (ref 11.5–15.5)
WBC: 10.2 10*3/uL (ref 4.0–10.5)
WBC: 11.9 10*3/uL — ABNORMAL HIGH (ref 4.0–10.5)

## 2015-10-13 LAB — LACTIC ACID, PLASMA
LACTIC ACID, VENOUS: 2.4 mmol/L — AB (ref 0.5–2.0)
Lactic Acid, Venous: 2.2 mmol/L (ref 0.5–2.0)
Lactic Acid, Venous: 2.9 mmol/L (ref 0.5–2.0)

## 2015-10-13 LAB — URINE MICROSCOPIC-ADD ON

## 2015-10-13 LAB — T4, FREE: FREE T4: 1.13 ng/dL — AB (ref 0.61–1.12)

## 2015-10-13 LAB — PROCALCITONIN: Procalcitonin: 2.13 ng/mL

## 2015-10-13 LAB — C DIFFICILE QUICK SCREEN W PCR REFLEX
C Diff antigen: NEGATIVE
C Diff interpretation: NEGATIVE
C Diff toxin: NEGATIVE

## 2015-10-13 LAB — MAGNESIUM: MAGNESIUM: 1.9 mg/dL (ref 1.7–2.4)

## 2015-10-13 MED ORDER — CHLORHEXIDINE GLUCONATE CLOTH 2 % EX PADS
6.0000 | MEDICATED_PAD | Freq: Every day | CUTANEOUS | Status: AC
Start: 2015-10-13 — End: 2015-10-17
  Administered 2015-10-14 – 2015-10-17 (×4): 6 via TOPICAL

## 2015-10-13 MED ORDER — SODIUM CHLORIDE 0.9 % IV BOLUS (SEPSIS)
1000.0000 mL | Freq: Once | INTRAVENOUS | Status: AC
  Administered 2015-10-13: 1000 mL via INTRAVENOUS

## 2015-10-13 MED ORDER — ALBUMIN HUMAN 25 % IV SOLN
120.0000 g | Freq: Once | INTRAVENOUS | Status: AC
  Administered 2015-10-13: 120 g via INTRAVENOUS
  Filled 2015-10-13: qty 500

## 2015-10-13 MED ORDER — MIDODRINE HCL 2.5 MG PO TABS
2.5000 mg | ORAL_TABLET | Freq: Three times a day (TID) | ORAL | Status: DC
  Filled 2015-10-13 (×4): qty 1

## 2015-10-13 MED ORDER — MUPIROCIN 2 % EX OINT
1.0000 | TOPICAL_OINTMENT | Freq: Two times a day (BID) | CUTANEOUS | Status: AC
Start: 2015-10-13 — End: 2015-10-17
  Administered 2015-10-13 – 2015-10-17 (×10): 1 via NASAL
  Filled 2015-10-13 (×2): qty 22

## 2015-10-13 MED ORDER — MEGESTROL ACETATE 400 MG/10ML PO SUSP
400.0000 mg | Freq: Every day | ORAL | Status: DC
  Administered 2015-10-13 – 2015-10-15 (×3): 400 mg via ORAL
  Filled 2015-10-13 (×4): qty 10

## 2015-10-13 MED ORDER — FLUCONAZOLE 100 MG PO TABS
100.0000 mg | ORAL_TABLET | Freq: Every day | ORAL | Status: DC
  Administered 2015-10-13 – 2015-10-15 (×3): 100 mg via ORAL
  Filled 2015-10-13 (×3): qty 1

## 2015-10-13 MED ORDER — VANCOMYCIN HCL IN DEXTROSE 750-5 MG/150ML-% IV SOLN
750.0000 mg | Freq: Two times a day (BID) | INTRAVENOUS | Status: DC
  Administered 2015-10-13 – 2015-10-14 (×2): 750 mg via INTRAVENOUS
  Filled 2015-10-13 (×4): qty 150

## 2015-10-13 MED ORDER — ALBUMIN HUMAN 25 % IV SOLN
12.5000 g | Freq: Once | INTRAVENOUS | Status: AC
  Administered 2015-10-13: 12.5 g via INTRAVENOUS
  Filled 2015-10-13: qty 50

## 2015-10-13 MED ORDER — SODIUM CHLORIDE 0.9 % IV BOLUS (SEPSIS)
500.0000 mL | Freq: Once | INTRAVENOUS | Status: AC
  Administered 2015-10-13: 500 mL via INTRAVENOUS

## 2015-10-13 MED ORDER — ENSURE ENLIVE PO LIQD
237.0000 mL | Freq: Two times a day (BID) | ORAL | Status: DC
  Administered 2015-10-13 (×2): 237 mL via ORAL

## 2015-10-13 NOTE — Progress Notes (Signed)
Pt received ordered NS 1L bolus and BP was 78/38 manually with HR 82 after the bolus was complete. Lactic acid remains elevated at 2.2. Paged on call PA regarding BP and lab results and received order to transfer pt to stepdown unit. Pt's only complaint at this time is that he feels tired. Able to ambulate to bathroom with assist. Continue to monitor. Hortencia Conradi RN

## 2015-10-13 NOTE — Progress Notes (Addendum)
Patient's blood pressure on the low side, being monitored , MD    Aware, patient had iv boluses and globulin ordered.Result of lactic acid relayed to MD.

## 2015-10-13 NOTE — Progress Notes (Addendum)
Pt transferred to stepdown via wheelchair. Declined RN's offer to call anyone to notify of transfer, he stated he would contact them on his own. Hortencia Conradi RN

## 2015-10-13 NOTE — Progress Notes (Signed)
Pharmacy Antibiotic Follow-up Note  Eric Schroeder is a 60 y.o. year-old male admitted on 09/22/2015.  The patient is currently on day 2 of Vancomycin and Ceftriaxone for SBP.  Assessment/Plan: Adjust vancomycin to 750 mg IV q12h for increasing SCr. Continue Ceftriaxone 2g IV q24h. F/u culture results for narrowing.  Temp (24hrs), Avg:98.1 F (36.7 C), Min:97.9 F (36.6 C), Max:98.3 F (36.8 C)   Recent Labs Lab 10/08/15 1121 10/03/2015 1325 10/12/15 0416 10/13/15 0545  WBC 7.5 10.2 8.9 10.2    Recent Labs Lab 10/01/2015 1325 09/24/2015 1925 10/12/15 0416 10/12/15 1635 10/13/15 0545  CREATININE 1.2 1.10 1.01 1.30* 1.95*   Estimated Creatinine Clearance: 41.3 mL/min (by C-G formula based on Cr of 1.95).    No Known Allergies  Antimicrobials this admission: 12/31 >>ceftriaxone >> 12/31 >> vanc >>   Levels/dose changes this admission: 1/1 reduce vanc 1g q12h to 750mg  q12h for increasing SCr  Microbiology Results: 12/31 peritoneal fluid: GPC in clusters 12/31 MRSA PCR: POSITIVE 12/31 blood: IP  Thank you for allowing pharmacy to be a part of this patient's care.  Hershal Coria PharmD 10/13/2015 11:11 AM

## 2015-10-13 NOTE — Consult Note (Signed)
PULMONARY / CRITICAL CARE MEDICINE   Name: Eric Schroeder MRN: [id number] DOB: [birth year]    ADMISSION DATE:  10/02/2015 CONSULTATION DATE:  10/13/15  REFERRING MD:  Erlinda Hong  CHIEF COMPLAINT:  Low blood pressure   HISTORY OF PRESENT ILLNESS:   60yo male w/hx of cholangiocarcinoma hospitalized on 12/30 for encephalopathy, AKI, and dehydration. He was a direct admit from the cancer center. He was provided with IVF, salt tabs, a paracentesis was performed on 12/31, 4.2L was removed, and he was started on rocephin for SBP as he had an elevated neutrophil count. Cultures positive for Gm+ cocci in clusters and he was subsequently started on vancomycin. His renal function has significantly worsened since his presentation.   PAST MEDICAL HISTORY :  He  has a past medical history of Liver mass; Hyponatremia (09/27/2015); and Hepatic cirrhosis ([place]).  PAST SURGICAL HISTORY: He  has no past surgical history on file.  No Known Allergies  No current facility-administered medications on file prior to encounter.   Current Outpatient Prescriptions on File Prior to Encounter  Medication Sig  . acetaminophen (TYLENOL) 500 MG tablet Take 500 mg by mouth every 6 (six) hours as needed for moderate pain.  [name] amoxicillin-clavulanate (AUGMENTIN) 875-125 MG tablet Take 1 tablet by mouth every 12 (twelve) hours.  . fluconazole (DIFLUCAN) 200 MG tablet Take 1 tablet (200 mg total) by mouth daily.  . sodium chloride 0.9 % injection 10 mLs by Other route daily. drain  . sodium chloride 1 G tablet Take 1 tablet (1 g total) by mouth 2 (two) times daily with a meal.    FAMILY HISTORY:  His has no family status information on file.   SOCIAL HISTORY: He  reports that he has never smoked. He does not have any smokeless tobacco history on file. He reports that he does not drink alcohol or use illicit drugs.  REVIEW OF SYSTEMS:   Constitutional: Negative for fever, chills,+ weight loss,+ malaise/fatigue   HENT:  Negative for hearing loss, ear pain, nosebleeds, congestion, sore throat, neck pain, tinnitus and ear discharge.   Eyes: Negative for blurred vision, double vision, photophobia, pain, discharge and redness.  Respiratory: Negative for cough, hemoptysis, sputum production, shortness of breath, wheezing and stridor.   Cardiovascular: Negative for chest pain, palpitations, orthopnea, claudication, +leg swelling and -PND.  Gastrointestinal: Negative for heartburn, +nausea,+ vomiting,+ abdominal pain, +diarrhea, -constipation, blood in stool and melena.  Genitourinary: Negative for dysuria, urgency, frequency, hematuria and flank pain. +decreased urine output Musculoskeletal: Negative for myalgias,+ back pain,+ joint pain and- falls.  Skin: Negative for itching and rash.  Neurological: Negative for dizziness, tingling, tremors, sensory change, speech change, focal weakness, seizures, loss of consciousness, and headaches.  Endo/Heme/Allergies: Negative for environmental allergies and polydipsia. Does not bruise/bleed easily.   SUBJECTIVE:  Patient states he feels okay. He does not feel dizzy nor lightheaded. He does not feel weaker than usual.   VITAL SIGNS: BP 78/38 mmHg  Pulse 82  Temp(Src) 98 F (36.7 C) (Oral)  Resp 16  Ht 5\' 9"  (1.753 m)  Wt 81.194 kg (179 lb)  BMI 26.42 kg/m2  SpO2 100%  HEMODYNAMICS:    VENTILATOR SETTINGS:    INTAKE / OUTPUT: I/O last 3 completed shifts: In: [id number] [P.O.:720; I.V.:4600; IV Piggyback:250] Out: [id number] [Drains:1605; Stool:1]  PHYSICAL EXAMINATION: General: 60 year old Caucasian male who appears older than stated age and is in no acute distress. He is alert awake oriented 3 he appears frail and well-developed Neuro:  Cranial nerves  II-XII grossly intact. PERRL. EOMI. Sensation and strength intact. No focal deficits.  HEENT: Head, normocephalic, atraumatic. Ears symmetric, permeable. Eyes: no conjunctival icterus, no erythema. Nose: permeable,  midline septum,. Clear oropharynx,  Cardiovascular: S1S2 RRR no murmurs, rubs, or gallops auscultated. No thrills palpated. Warm extremities Lungs:  Chest symmetrical with respirations, clear to auscultation bilaterally with no wheezing, crackles, equal expansion.  Abdomen:  Soft, moderate amount of ascites. nontender, no guarding, . Present bowel sounds. Musculoskeletal:  No muscle atrophy noted nor weakness. ROM intact. No swelling nor tenderness of the joints.  Skin:  No notable scars, rashes, cruises. No bed sores noted. Patient has a bandage on his right lower quadrant where his paracentesis was recently performed. Patient has a drain also from his subxiphoid area wrapped in a bandage  Lymphatics: no palpable lymphadenopathy of cervical, supraclvicular nor inguinal areas.     LABS:  BMET  Recent Labs Lab 10/12/15 1635 10/13/15 0545 10/13/15 1720  NA 127* 127* 128*  K 4.7 4.4 4.1  CL 96* 96* 99*  CO2 25 22 22   BUN 52* 56* 62*  CREATININE 1.30* 1.95* 2.27*  GLUCOSE 110* 117* 113*    Electrolytes  Recent Labs Lab 10/12/15 1635 10/13/15 0545 10/13/15 1720  CALCIUM 9.2 9.1 8.7*  MG  --  1.9  --     CBC  Recent Labs Lab 10/12/15 0416 10/13/15 0545 10/13/15 1720  WBC 8.9 10.2 11.9*  HGB 12.0* 12.0* 11.4*  HCT 34.6* 33.7* 32.0*  PLT 88* 87* 82*    Coag's No results for input(s): APTT, INR in the last 168 hours.  Sepsis Markers  Recent Labs Lab 10/13/15 0545 10/13/15 1720 10/13/15 1930  LATICACIDVEN  --  2.4* 2.2*  PROCALCITON 2.13  --   --     ABG No results for input(s): PHART, PCO2ART, PO2ART in the last 168 hours.  Liver Enzymes  Recent Labs Lab 10/12/15 1635 10/13/15 0545 10/13/15 1720  AST 36 33 37  ALT 28 24 24   ALKPHOS 106 94 90  BILITOT 2.3* 1.9* 2.0*  ALBUMIN 1.8* 1.5* 1.5*    Cardiac Enzymes No results for input(s): TROPONINI, PROBNP in the last 168 hours.  Glucose No results for input(s): GLUCAP in the last 168  hours.  Imaging No results found.   STUDIES:  Chest x-ray is unremarkable  CULTURES: C. difficile is negative MRSA screen is positive  Blood cultures 2 negative  Peritoneal fluid with gram-positive cocci in clusters UA unremarkable  ANTIBIOTICS: Vancomycin, Diflucan, ceftriaxone  SIGNIFICANT EVENTS: Patient was admitted for dehydration. He was also noted to have ascites and had a paracentesis performed. After the paracentesis patient has been hypotensive.  LINES/TUBES: Biliary drain in place peripheral IVs  DISCUSSION: 60 year old Caucasian male with cholangiocarcinoma who presented with weakness and dehydration. He had a paracentesis performed and had approximately 4.2 L removed. He has seen a decrease in his blood pressure and a significant worsening in his renal function.  ASSESSMENT / PLAN:  PULMONARY A: No active issues P:     CARDIOVASCULAR A: Hypotension in the setting of cirrhosis and spontaneous bacterial peritonitis  P: Would avoid placing a central line at this time as the patient clinically looks well, ambulating. We'll start patient on Midodrine  RENAL A:  Possible hepatorenal syndrome, Acute kidney injury with creatinine increasing from 1.3 to 2.27 in 24 hours STOP NSAIDS, discontinued ketorolac P:  Check urine sodium, provide albumin for volume resuscitation, avoid nephrotoxic agents  GASTROINTESTINAL A:  Cholangiocarcinoma of  the biliary tract Recent biliary obstruction status post drain placement  P:  Patient is to see [facility] surgical oncology per the oncology service here at [facility]. Patient may need another abdominal tap prior to discharge   HEMATOLOGIC A:  Leukocytosis, likely reactive to SBP Thrombocytopenia secondary to cirrhosis Macrocytic anemia P:  We'll check a B12 and folate level. Monitor leukocytosis, monitor the thrombocytopenia. No need to transfuse platelets at this time  INFECTIOUS A:  Spontaneous bacterial peritonitis,  possibly iatrogenically with the presence of gram-positive cocci in clusters. Started the patient on Midodrin for his hypotension Some of his persistent lactic acidosis could be secondary to his hepatic dysfunction Patient is on Diflucan due to recent fungal growth of his biliary fluid P:  Increase his ceftriaxone to 2gm IV q8h. continue vancomycin, follow-up cultures    ENDOCRINE A:  No active issues    P:     NEUROLOGIC A:  No active issues. This patient does not appear to have any type of encephalopathy at the time of my encounter with him.  P:   RASS goal: 0    FAMILY  - Updates: No family at bedside care was discussed with the patient directly.   - Inter-disciplinary family meet or Palliative Care meeting due by:  day 7  Critical care time spent addressing electrolyte issues, IV fluid resuscitation with albumin, antibiotic management, discussions with the patient, management of hypotension with persistently elevated lactic acid 45 minutes  Randa Lynn, MD [place] Pager: [phone]  10/13/2015, 9:52 PM

## 2015-10-13 NOTE — Progress Notes (Signed)
CRITICAL VALUE ALERT  Critical value received: lactic acid 2.2  Date of notification:  10/13/15  Time of notification:  2049  Critical value read back:Yes.    Nurse who received alert:  Hortencia Conradi RN   MD notified (1st page):  [place] PA  Time of first page:  2050  MD notified (2nd page):  Time of second page:  Responding MD:  [place] PA  Time MD responded:

## 2015-10-13 NOTE — Progress Notes (Addendum)
PROGRESS NOTE  Eric Schroeder [id number] DOB: 10-Sep-1956 DOA: 10/05/2015 PCP: No PCP Per Patient  HPI/Recap of past 24 hours:  Reported feeling better, mild ab pain, no n/v, no fever, bp low, denies dizziness. Documented drain output 1.6liter last 24hrs. Bmx4, no ab pain. bp low reported feeling better, denies pain, no fever, no n/v. No confusion  Assessment/Plan: Active Problems:   Dehydration  Addendum: ARF/hypotensive/tachycardia, from infection vs from large volume paracentesis, will give ivf, albumin and midodrine. Repeat cbc/cmp STAT, may need to be transferred to stepdown.  Dehydration/hyponatremia/increased biliary drain:  ivf, salt tabs, discussed with Intervnetional radiology Dr. Barbie Banner who recommend that patient may need paracentesis more frequent and intermittent capping of biliary drain to prevent dehydration, over all poor prognosis due to large obstructing cbd cancer with background cirrhosis. Continue hydration and salt tabs, IR following  Ascites/SBP: s/p US guided paracentesis on 12/31, fluids studies cell count with elevated neutrophil> 500, fluids culture + G+coccus in clusters, start vanc/rocephin for sbp  Diarrhea? 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 I have discussed with patient about overall poor prognosis if the tumor not able to be resected or not able to shrink.  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, decolonization protocol  DVT prophylaxis: scd  Consultants:  IR Oncology (dr Marin Olp)  Code Status: full   Family Communication: Patient   Disposition Plan: pending   Procedures:  US guided paracentesis on 12/31 with removal 4.2 liter yellow fluids  Antibiotics:  Rocephin/vanc from 12/31   Objective: BP 86/50 mmHg  Pulse 92  Temp(Src) 97.9 F (36.6 C) (Oral)  Resp 16   Ht 5\' 9"  (1.753 m)  Wt 179 lb (81.194 kg)  BMI 26.42 kg/m2  SpO2 100%  Intake/Output Summary (Last 24 hours) at 10/13/15 0756 Last data filed at 10/13/15 0659  Gross per 24 hour  Intake   1080 ml  Output   1600 ml  Net   -520 ml   Filed Weights   10/01/2015 1721  Weight: 179 lb (81.194 kg)    Exam:   General: Thin frail, mild jaundice, NAD  Eyes: PERRL  ENT: unremarkable  Neck: supple, no JVD  Cardiovascular: RRR  Respiratory: CTABL  Abdomen: less distended, no significant tender, no rebound, positive bowel sounds, +biliary drain   Skin: no rash  Musculoskeletal: No edema  Psychiatric: calm/cooperative  Neurologic: no focal findings , chronic right foot drop  Data Reviewed: Basic Metabolic Panel:  Recent Labs Lab 10/08/15 1121 09/16/2015 1325 09/25/2015 1925 10/12/15 0416 10/12/15 1635 10/13/15 0545  NA 132* 132* 129* 132* 127*  --   K 4.7 5.5* 4.7 4.5 4.7  --   CL  --   --  97* 99* 96*  --   CO2 23 23 24 24 25   --   GLUCOSE 173* 113 108* 122* 110*  --   BUN 18.5 38.5* 42* 47* 52*  --   CREATININE 0.8 1.2 1.10 1.01 1.30*  --   CALCIUM 9.0 10.1 9.6 9.6 9.2  --   MG  --   --   --   --   --  1.9   Liver Function Tests:  Recent Labs Lab 10/08/15 1121 09/17/2015 1325 10/12/15 0416 10/12/15 1635  AST 88* 49* 41 36  ALT 54 39 33 28  ALKPHOS 191* 159* 115 106  BILITOT 2.17* 3.20* 2.8* 2.3*  PROT 7.1 7.5 6.7 6.4*  ALBUMIN 2.1* 2.0* 1.9* 1.8*   No results for input(s): LIPASE, AMYLASE in the last 168 hours. No results for input(s): AMMONIA in the last 168 hours. CBC:  Recent Labs Lab 10/08/15 1121 09/24/2015 1325 10/12/15 0416 10/13/15 0545  WBC 7.5 10.2 8.9 10.2  NEUTROABS 5.3 8.5*  --   --   HGB 13.2 13.4 12.0* 12.0*  HCT 38.0* 40.2 34.6* 33.7*  MCV 102.7* 107.5* 101.8* 100.6*  PLT 89* 101* 88* 87*   Cardiac Enzymes:   No results for input(s): CKTOTAL, CKMB, CKMBINDEX, TROPONINI in the last 168 hours. BNP (last 3 results) No  results for input(s): BNP in the last 8760 hours.  ProBNP (last 3 results) No results for input(s): PROBNP in the last 8760 hours.  CBG: No results for input(s): GLUCAP in the last 168 hours.  Recent Results (from the past 240 hour(s))  Culture, body fluid-bottle     Status: None (Preliminary result)   Collection Time: 10/12/15  3:55 PM  Result Value Ref Range Status   Specimen Description PERITONEAL  Final   Special Requests BOTTLES DRAWN AEROBIC AND ANAEROBIC 24ml  Final   Gram Stain   Final    GRAM POSITIVE COCCI IN CLUSTERS IN BOTH AEROBIC AND ANAEROBIC BOTTLES CRITICAL RESULT CALLED TO, READ BACK BY AND VERIFIED WITH: TO JCLARK(RN) 10/13/15 BY TCLEVELAND 10/13/15 3:30AM Performed at [facility]    Culture PENDING  Incomplete   Report Status PENDING  Incomplete  Gram stain     Status: None (Preliminary result)   Collection Time: 10/12/15  3:55 PM  Result Value Ref Range Status   Specimen Description PERITONEAL  Final   Special Requests NONE  Final   Gram Stain   Final    WBC PRESENT,BOTH PMN AND MONONUCLEAR GRAM POSITIVE COCCI IN CLUSTERS CRITICAL RESULT CALLED TO, READ BACK BY AND VERIFIED WITH: R.BALDWIN,RN 10/12/15 @1744  BY V.WILKINS CONFIRMED BY K.WOOTEN Performed at [facility]    Report Status PENDING  Incomplete  MRSA PCR Screening     Status: Abnormal   Collection Time: 10/12/15  6:53 PM  Result Value Ref Range Status   MRSA by PCR POSITIVE (A) NEGATIVE Final    Comment:        The GeneXpert MRSA Assay (FDA approved for NASAL specimens only), is one component of a comprehensive MRSA colonization surveillance program. It is not intended to diagnose MRSA infection nor to guide or monitor treatment for MRSA infections. RESULT CALLED TO, READ BACK BY AND VERIFIED WITH: A.SAISON,RN AT 2153 ON 10/12/15 BY W.SHEA      Studies: US Paracentesis  10/12/2015  INDICATION: ascites cholangiocarcinoma EXAM: ULTRASOUND-GUIDED PARACENTESIS COMPARISON:   Previous paracentesis MEDICATIONS: 10 cc 1% lidocaine COMPLICATIONS: None immediate TECHNIQUE: Informed written consent was obtained from the patient after a discussion of the risks, benefits and alternatives to treatment. A timeout was performed prior to the initiation of the procedure. Initial ultrasound scanning demonstrates a large amount of ascites within the right lower abdominal quadrant. The right lower abdomen was prepped and draped in the usual sterile fashion. 1% lidocaine with epinephrine was used for local anesthesia. Under direct ultrasound guidance, a 19 gauge, 7-cm, Yueh catheter was introduced. An ultrasound image was saved for documentation purposed. The paracentesis was performed. The catheter was removed and a dressing was applied. The patient tolerated the procedure well without immediate post procedural complication. FINDINGS: A total of approximately 4.2 liters of yellow fluid was removed. Samples  were sent to the laboratory as requested by the clinical team. IMPRESSION: Successful ultrasound-guided paracentesis yielding 4.2 liters of peritoneal fluid. Read By:  Lavonia Drafts [facility] Electronically Signed   By: Marybelle Killings M.D.   On: 10/12/2015 12:52    Scheduled Meds: . cefTRIAXone (ROCEPHIN)  IV  2 g Intravenous Q24H  . Chlorhexidine Gluconate Cloth  6 each Topical Q0600  . mupirocin ointment  1 application Nasal BID  . sodium chloride  1,000 mL Intravenous Once  . sodium chloride  3 mL Intravenous Q12H  . sodium chloride  1 g Oral TID WC  . vancomycin  1,000 mg Intravenous Q12H    Continuous Infusions: . sodium chloride 75 mL/hr at 10/10/2015 2303     Time spent: 29mins  Deklyn Gibbon MD, PhD  [facility] Pager [phone]. If 7PM-7AM, please contact night-coverage at [email or web address], password [email or web address] 10/13/2015, 7:56 AM  LOS: 2 days

## 2015-10-13 DEATH — deceased

## 2015-10-14 DIAGNOSIS — K7469 Other cirrhosis of liver: Secondary | ICD-10-CM

## 2015-10-14 DIAGNOSIS — K652 Spontaneous bacterial peritonitis: Secondary | ICD-10-CM | POA: Insufficient documentation

## 2015-10-14 DIAGNOSIS — K831 Obstruction of bile duct: Secondary | ICD-10-CM

## 2015-10-14 DIAGNOSIS — I9581 Postprocedural hypotension: Secondary | ICD-10-CM

## 2015-10-14 DIAGNOSIS — C221 Intrahepatic bile duct carcinoma: Secondary | ICD-10-CM | POA: Insufficient documentation

## 2015-10-14 DIAGNOSIS — R188 Other ascites: Secondary | ICD-10-CM

## 2015-10-14 LAB — COMPREHENSIVE METABOLIC PANEL
ALK PHOS: 61 U/L (ref 38–126)
ALT: 17 U/L (ref 17–63)
AST: 28 U/L (ref 15–41)
Albumin: 3.2 g/dL — ABNORMAL LOW (ref 3.5–5.0)
Anion gap: 9 (ref 5–15)
BILIRUBIN TOTAL: 1.7 mg/dL — AB (ref 0.3–1.2)
BUN: 61 mg/dL — AB (ref 6–20)
CALCIUM: 9.7 mg/dL (ref 8.9–10.3)
CHLORIDE: 104 mmol/L (ref 101–111)
CO2: 20 mmol/L — ABNORMAL LOW (ref 22–32)
CREATININE: 2.35 mg/dL — AB (ref 0.61–1.24)
GFR, EST AFRICAN AMERICAN: 33 mL/min — AB (ref >60)
GFR, EST NON AFRICAN AMERICAN: 29 mL/min — AB (ref >60)
Glucose, Bld: 104 mg/dL — ABNORMAL HIGH (ref 65–99)
Potassium: 4.2 mmol/L (ref 3.5–5.1)
Sodium: 133 mmol/L — ABNORMAL LOW (ref 135–145)
TOTAL PROTEIN: 6 g/dL — AB (ref 6.5–8.1)

## 2015-10-14 LAB — CBC WITH DIFFERENTIAL/PLATELET
BASOS PCT: 2 %
Basophils Absolute: 0.2 10*3/uL — ABNORMAL HIGH (ref 0.0–0.1)
EOS PCT: 7 %
Eosinophils Absolute: 0.6 10*3/uL (ref 0.0–0.7)
HCT: 26.8 % — ABNORMAL LOW (ref 39.0–52.0)
Hemoglobin: 9.3 g/dL — ABNORMAL LOW (ref 13.0–17.0)
Lymphocytes Relative: 11 %
Lymphs Abs: 0.9 10*3/uL (ref 0.7–4.0)
MCH: 35 pg — AB (ref 26.0–34.0)
MCHC: 34.7 g/dL (ref 30.0–36.0)
MCV: 100.8 fL — AB (ref 78.0–100.0)
MONO ABS: 0.8 10*3/uL (ref 0.1–1.0)
Monocytes Relative: 10 %
NEUTROS ABS: 5.7 10*3/uL (ref 1.7–7.7)
NEUTROS PCT: 70 %
PLATELETS: 70 10*3/uL — AB (ref 150–400)
RBC: 2.66 MIL/uL — ABNORMAL LOW (ref 4.22–5.81)
RDW: 13.9 % (ref 11.5–15.5)
WBC: 8.2 10*3/uL (ref 4.0–10.5)

## 2015-10-14 LAB — PROTIME-INR
INR: 2.39 — ABNORMAL HIGH (ref 0.00–1.49)
PROTHROMBIN TIME: 25.8 s — AB (ref 11.6–15.2)

## 2015-10-14 LAB — BASIC METABOLIC PANEL
Anion gap: 11 (ref 5–15)
BUN: 77 mg/dL — AB (ref 6–20)
CALCIUM: 10.1 mg/dL (ref 8.9–10.3)
CHLORIDE: 103 mmol/L (ref 101–111)
CO2: 19 mmol/L — AB (ref 22–32)
CREATININE: 2.55 mg/dL — AB (ref 0.61–1.24)
GFR calc non Af Amer: 26 mL/min — ABNORMAL LOW (ref >60)
GFR, EST AFRICAN AMERICAN: 30 mL/min — AB (ref >60)
Glucose, Bld: 84 mg/dL (ref 65–99)
Potassium: 4.4 mmol/L (ref 3.5–5.1)
Sodium: 133 mmol/L — ABNORMAL LOW (ref 135–145)

## 2015-10-14 LAB — VITAMIN B12: VITAMIN B 12: 1159 pg/mL — AB (ref 180–914)

## 2015-10-14 LAB — VANCOMYCIN, TROUGH: Vancomycin Tr: 34 ug/mL (ref 10.0–20.0)

## 2015-10-14 LAB — SODIUM, URINE, RANDOM

## 2015-10-14 LAB — LACTIC ACID, PLASMA: LACTIC ACID, VENOUS: 2.3 mmol/L — AB (ref 0.5–2.0)

## 2015-10-14 MED ORDER — ALBUMIN HUMAN 25 % IV SOLN
12.5000 g | Freq: Once | INTRAVENOUS | Status: AC
  Administered 2015-10-14: 12.5 g via INTRAVENOUS
  Filled 2015-10-14: qty 50

## 2015-10-14 MED ORDER — ALBUMIN HUMAN 25 % IV SOLN
INTRAVENOUS | Status: AC
  Filled 2015-10-14: qty 50

## 2015-10-14 MED ORDER — ALBUMIN HUMAN 25 % IV SOLN
37.5000 g | Freq: Once | INTRAVENOUS | Status: AC
  Administered 2015-10-14: 37.5 g via INTRAVENOUS
  Filled 2015-10-14: qty 100

## 2015-10-14 MED ORDER — TRAMADOL HCL 50 MG PO TABS
50.0000 mg | ORAL_TABLET | Freq: Four times a day (QID) | ORAL | Status: DC | PRN
  Administered 2015-10-14: 50 mg via ORAL
  Filled 2015-10-14: qty 1

## 2015-10-14 MED ORDER — MIDODRINE HCL 5 MG PO TABS
5.0000 mg | ORAL_TABLET | Freq: Three times a day (TID) | ORAL | Status: DC
  Administered 2015-10-14: 5 mg via ORAL
  Filled 2015-10-14 (×4): qty 1

## 2015-10-14 MED ORDER — ENSURE ENLIVE PO LIQD
237.0000 mL | Freq: Three times a day (TID) | ORAL | Status: DC
  Administered 2015-10-14 – 2015-10-15 (×3): 237 mL via ORAL

## 2015-10-14 MED ORDER — ACETAMINOPHEN 325 MG PO TABS
650.0000 mg | ORAL_TABLET | Freq: Four times a day (QID) | ORAL | Status: DC | PRN
  Administered 2015-10-14: 650 mg via ORAL
  Filled 2015-10-14: qty 2

## 2015-10-14 MED ORDER — MIDODRINE HCL 5 MG PO TABS
10.0000 mg | ORAL_TABLET | Freq: Three times a day (TID) | ORAL | Status: DC
  Administered 2015-10-14 – 2015-10-15 (×3): 10 mg via ORAL
  Filled 2015-10-14 (×9): qty 2

## 2015-10-14 NOTE — Progress Notes (Signed)
CRITICAL VALUE ALERT  Critical value received:  Vancomycin level 34  Date of notification:  10/14/2015  Time of notification:  1940  Critical value read back:Yes.    Nurse who received alert:  Leanna Sato, RN  MD notified (1st page): Baltazar Najjar  Time of first page:  1948  Responding MD:  Baltazar Najjar  Time MD responded:  1950  MD stated to notify pharmacy, order discontinued per pharmacy

## 2015-10-14 NOTE — Consult Note (Signed)
PULMONARY / CRITICAL CARE MEDICINE   Name: Eric Schroeder MRN: [id number] DOB: [birth year]    ADMISSION DATE:  09/23/2015 CONSULTATION DATE:  10/13/15  REFERRING MD:  Erlinda Hong  CHIEF COMPLAINT:  Low blood pressure   BRIEF:   60 y/o male with cholangiocarcinoma hospitalized 12/30 with encephalopathy and AKI due to staph peritonitis.   SUBJECTIVE:   Felt lousy this morning, better now Appetite OK BP still low  VITAL SIGNS: BP 88/56 mmHg  Pulse 94  Temp(Src) 97.8 F (36.6 C) (Oral)  Resp 21  Ht '5\' 9"'$  (1.753 m)  Wt 82.6 kg (182 lb 1.6 oz)  BMI 26.88 kg/m2  SpO2 100%  HEMODYNAMICS:    VENTILATOR SETTINGS:    INTAKE / OUTPUT: I/O last 3 completed shifts: In: 6283 [P.O.:240; I.V.:5940; IV Piggyback:400] Out: 911 [Urine:30; Drains:880; Stool:1]  PHYSICAL EXAMINATION:  Gen: chronically ill appearing HENT: NCAT OP Clear PULM: CTA B, normal effort CV: RRR, no mgr GI: BS+, soft, nontender MSK: normal bulk and tone Neuro: A&Ox4, MAEW Derm: no rash, grey color   LABS:  BMET  Recent Labs Lab 10/13/15 0545 10/13/15 1720 10/14/15 0401  NA 127* 128* 133*  K 4.4 4.1 4.2  CL 96* 99* 104  CO2 22 22 20*  BUN 56* 62* 61*  CREATININE 1.95* 2.27* 2.35*  GLUCOSE 117* 113* 104*    Electrolytes  Recent Labs Lab 10/13/15 0545 10/13/15 1720 10/14/15 0401  CALCIUM 9.1 8.7* 9.7  MG 1.9  --   --     CBC  Recent Labs Lab 10/13/15 0545 10/13/15 1720 10/14/15 0401  WBC 10.2 11.9* 8.2  HGB 12.0* 11.4* 9.3*  HCT 33.7* 32.0* 26.8*  PLT 87* 82* 70*    Coag's  Recent Labs Lab 10/14/15 0401  INR 2.39*    Sepsis Markers  Recent Labs Lab 10/13/15 0545  10/13/15 1930 10/13/15 2220 10/14/15 0401  LATICACIDVEN  --   < > 2.2* 2.9* 2.3*  PROCALCITON 2.13  --   --   --   --   < > = values in this interval not displayed.  ABG No results for input(s): PHART, PCO2ART, PO2ART in the last 168 hours.  Liver Enzymes  Recent Labs Lab 10/13/15 0545  10/13/15 1720 10/14/15 0401  AST 33 37 28  ALT '24 24 17  '$ ALKPHOS 94 90 61  BILITOT 1.9* 2.0* 1.7*  ALBUMIN 1.5* 1.5* 3.2*    Cardiac Enzymes No results for input(s): TROPONINI, PROBNP in the last 168 hours.  Glucose No results for input(s): GLUCAP in the last 168 hours.  Imaging No results found.   STUDIES:    CULTURES: 1/1 C. difficile > negative 12/31 Blood cultures 2 > negative  12/31 Peritoneal fluid > staph   ANTIBIOTICS: 12/31 Vancomycin  12/31 Ceftriaxone 1/1 diflucan >  SIGNIFICANT EVENTS:   LINES/TUBES: Biliary drain in place peripheral IVs  DISCUSSION: 60 year old Caucasian male with Hep C cirrhosis, cholangiocarcinoma who presented with weakness and dehydration. He had a paracentesis performed and had approximately 4.2 L removed. He has seen a decrease in his blood pressure and a significant worsening in his renal function.  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  Does not appear septic, I think the BP drop is due to cirrhosis, low oncotic pressure.    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> Acute kidney injury with creatinine increasing also on NSAIDS at home  P: Continue albumin, increased dose Would consult renal STOP NSAIDS, discontinued ketorolac  GASTROINTESTINAL A:   Cholangiocarcinoma of the biliary tract > prognosis seems poor considering severity of this illness and baseline cirrhosis Recent biliary obstruction status post drain placement  P:   Will ask oncology to see him Diet per primary  service   HEMATOLOGIC A:   Leukocytosis, likely reactive to SBP Thrombocytopenia secondary to cirrhosis Macrocytic anemia, not bleeding P:  Monitor for bleeding  INFECTIOUS A:   SBP > staph now, recent fungal? P:   Discontinue ceftriaxone, continue vancomycin, diflucan F/u cultures Narrow antibiotics based on speciation of cultures    ENDOCRINE A:  No active issues    P:     NEUROLOGIC A:   No active issues  P:   Minimize sedating medications    FAMILY  - Updates: Met with long time girlfriend bedside 1/2   - Inter-disciplinary family meet or Palliative Care meeting due by:  day 7  Will sign off  Roselie Awkward, MD [place] [facility] Pager: [phone] Cell: [phone] After 3pm or if no response, call [phone]   10/14/2015, 10:29 AM

## 2015-10-14 NOTE — Progress Notes (Addendum)
CRITICAL VALUE ALERT  Critical value received: Lactic acid 2.3  Date of notification:  10/14/2015  Time of notification:  0525  Critical value read back:Yes.    Nurse who received alert:  Leanna Sato  MD notified (1st page):  [place]  Time of first page:  0555  Time of second page: 0626  Responding MD:  Rachelle Hora  Time MD responded:  [phone]

## 2015-10-14 NOTE — Progress Notes (Signed)
CRITICAL VALUE ALERT  Critical value received:  Lactic Acid: 2.9  Date of notification:  10/14/2015  Time of notification: 2317  Critical value read back:Yes.    Nurse who received alert:  Leanna Sato, RN  MD notified (1st page):  [place]  Time of first page:  0010 (text page)  Time of second page: 0200 (phone call)  Responding MD:  Rachelle Hora  Time MD responded:  0200

## 2015-10-14 NOTE — Progress Notes (Signed)
The patient's left arm was found to have a skin tear under the tape from the IV dressing. No sting universal adhesive remover was applied to the top of the tape and the tape was then able to be removed. Once the tape was removed, the skin tear was observed to be the size of the tape. The site was cleansed, petroleum dressing and dry gauze was applied to wound and paper tape was used to reinforce the dressing. Day shift RN was notified and observed site before dressing was applied. Will continue to monitor. Baldemar Lenis, RN 10/14/2015 8:13 AM

## 2015-10-14 NOTE — Progress Notes (Signed)
Left arm IV tape tearing skin. Unable to remove tape at this time due to skin tearing with pulling of tape. Notified charge nurse.   Will continue to monitor.

## 2015-10-14 NOTE — Progress Notes (Signed)
PHARMACIST - PHYSICIAN COMMUNICATION CONCERNING:  Vancomycin  59 yoM on D3 Vancomycin for staph aureus in peritoneal fluid.  SCr has been rising and vancomycin dose has been adjusted several times to account for change in renal function.   Vancomycin level checked tonight = 34 mcg/ml, supratherapeutic (goal 15-20 mcg/ml).     RECOMMENDATION: Hold vancomycin F/u BMET in AM to assess renal function and schedule repeat vancomycin level  Ralene Bathe, PharmD, BCPS 10/14/2015, 7:52 PM  Pager: [phone]

## 2015-10-14 NOTE — Progress Notes (Signed)
Pharmacy Antibiotic Follow-up Note  Eric Schroeder is a 60 y.o. year-old male admitted on 09/22/2015.  The patient is currently on day 3 of Vancomycin and Ceftriaxone for SBP.  Assessment/Plan: Check vancomycin trough tonight at 17:30 to r/o accumultation Continue Ceftriaxone 2g IV q24h. F/u culture results for narrowing.  Temp (24hrs), Avg:97.7 F (36.5 C), Min:97.3 F (36.3 C), Max:98 F (36.7 C)   Recent Labs Lab 09/18/2015 1325 10/12/15 0416 10/13/15 0545 10/13/15 1720 10/14/15 0401  WBC 10.2 8.9 10.2 11.9* 8.2     Recent Labs Lab 10/12/15 0416 10/12/15 1635 10/13/15 0545 10/13/15 1720 10/14/15 0401  CREATININE 1.01 1.30* 1.95* 2.27* 2.35*   Estimated Creatinine Clearance: 34.3 mL/min (by C-G formula based on Cr of 2.35).    No Known Allergies  Antimicrobials this admission: 12/31 >>ceftriaxone >> 12/31 >> vancomycin >>   Levels/dose changes this admission: 1/1 reduce vanc 1g q12h to 750mg  q12h for increasing SCr 1/2 vancomycin trough at 17:30 = ___ before 5th total dose  Microbiology Results: 12/31 peritoneal fluid: GPC in clusters on GS, cx ngtd 12/31 MRSA PCR: POSITIVE 12/31 blood: ngtd 1/1 C.diff: Ag neg, toxin neg  Thank you for allowing pharmacy to be a part of this patient's care.  Peggyann Juba, PharmD, BCPS Pager: [phone]  10/14/2015 8:18 AM

## 2015-10-14 NOTE — Progress Notes (Signed)
Initial Nutrition Assessment  DOCUMENTATION CODES:   Severe malnutrition in context of chronic illness  INTERVENTION:   Continue Ensure Enlive po TID, each supplement provides 350 kcal and 20 grams of protein RD to continue to monitor  NUTRITION DIAGNOSIS:   Malnutrition related to chronic illness as evidenced by severe depletion of body fat, severe depletion of muscle mass.  GOAL:   Patient will meet greater than or equal to 90% of their needs  MONITOR:   PO intake, Supplement acceptance, Labs, Weight trends, Skin, I & O's  REASON FOR ASSESSMENT:   Malnutrition Screening Tool    ASSESSMENT:   60 y.o male Who was recently diagnosed with cholangiocarcinoma s/p biliary drain is sent from oncology clinic for direct admission due to increased biliary drain, feeling dehydrated and worsening of ascites, he reports abdominal discomfort, but denies significant pain, no fever, no lower extremity edema.  Pt familiar with RD from previous admission this in December. Pt in room with family not present. Pt's weight continues to fluctuate d/t fluid accumulation/ascites. Pt currently eating 50% of meals. Megace has been ordered.  Nutrition-Focused physical exam completed. Findings are severe fat depletion, severe muscle depletion, and no edema.   Labs reviewed: Low Na Elevated BUN & Creatinine  Diet Order:  Diet regular Room service appropriate?: Yes; Fluid consistency:: Thin  Skin:  Wound (see comment) (arm wound)  Last BM:  1/2  Height:   Ht Readings from Last 1 Encounters:  10/13/15 5\' 9"  (1.753 m)    Weight:   Wt Readings from Last 1 Encounters:  10/14/15 182 lb 1.6 oz (82.6 kg)    Ideal Body Weight:  72.7 kg  BMI:  Body mass index is 26.88 kg/(m^2).  Estimated Nutritional Needs:   Kcal:  2100-2300  Protein:  115-125g  Fluid:  1.8L/day  EDUCATION NEEDS:   No education needs identified at this time  Clayton Bibles, MS, RD, LDN Pager: [phone] After Hours  Pager: [phone]

## 2015-10-14 NOTE — Progress Notes (Signed)
PROGRESS NOTE  Eric Schroeder [id number] DOB: [birth year] DOA: 10/02/2015 PCP: No PCP Per Patient  HPI/Recap of past 24 hours:  Transferred to stepdown due to hypotension and lactic acidosis, patient reported not feeling well last night, feeling better this morning,   Reported ascites reaccumulated,   Documented drain output 555 last 24hrs. Loose bm x2. Urine output 30cc overnight, bp remain borderline low  denies ab pain, no fever, no n/v. No confusion  Assessment/Plan: Active Problems:   Dehydration  ARF/hypotensive/tachycardia,  from infection vs from large volume paracentesis/hepatorenal? ,  Continue ivf, albumin and midodrine.  Appreciate critical care input.  Lactic acidosis: in the setting of infection, arf, and hepatic dysfunction.   Dehydration/hyponatremia/increased biliary drain:  ivf, salt tabs, discussed with Intervnetional radiology Dr. Barbie Banner who recommend that patient may need paracentesis more frequent and intermittent capping of biliary drain to prevent dehydration, over all poor prognosis due to large obstructing cbd cancer with background cirrhosis. Continue hydration and salt tabs, IR following  Ascites/SBP: s/p US guided paracentesis on 12/31, fluids studies cell count with elevated neutrophil> 500, fluids culture + G+coccus in clusters, start vanc/rocephin for sbp, restarted diflucan due to clinical deterioration (h/o rare budding yeast, candida albicans in peritoneal fluids on 12/6 and 12/8)  Cirrhosis: with elevated INR, thrombocytopenia, ascites.   Diarrhea? 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 Further management defer to oncology.  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, decolonization  protocol  DVT prophylaxis: scd  Consultants:  IR Oncology (dr Marin Olp) Critical care  Code Status: full   Family Communication: Patient and family (girlfriend, father and mother in room)  Disposition Plan: pending   Procedures:  US guided paracentesis on 12/31 with removal 4.2 liter yellow fluids  Antibiotics:  Rocephin/vanc from 12/31   Objective: BP 88/56 mmHg  Pulse 94  Temp(Src) 97.8 F (36.6 C) (Oral)  Resp 21  Ht 5\' 9"  (1.753 m)  Wt 182 lb 1.6 oz (82.6 kg)  BMI 26.88 kg/m2  SpO2 100%  Intake/Output Summary (Last 24 hours) at 10/14/15 0844 Last data filed at 10/14/15 0700  Gross per 24 hour  Intake   5580 ml  Output    586 ml  Net   4994 ml   Filed Weights   09/25/2015 1721 10/13/15 2248 10/14/15 0535  Weight: 179 lb (81.194 kg) 195 lb 12.3 oz (88.8 kg) 182 lb 1.6 oz (82.6 kg)    Exam:   General: Thin frail, mild jaundice, NAD  Eyes: PERRL  ENT: unremarkable  Neck: supple, no JVD  Cardiovascular: RRR  Respiratory: CTABL  Abdomen: ascites reaccumulated,  no significant tender, no rebound, positive bowel sounds, +biliary drain   Skin: no rash  Musculoskeletal: No edema  Psychiatric: calm/cooperative  Neurologic: no focal findings , chronic right foot drop  Data Reviewed: Basic Metabolic Panel:  Recent Labs Lab 10/12/15 0416 10/12/15 1635 10/13/15 0545 10/13/15 1720 10/14/15 0401  NA 132* 127* 127* 128* 133*  K 4.5 4.7 4.4 4.1 4.2  CL 99* 96* 96* 99* 104  CO2 24 25 22 22  20*  GLUCOSE 122* 110* 117* 113* 104*  BUN 47* 52* 56* 62* 61*  CREATININE 1.01 1.30* 1.95* 2.27* 2.35*  CALCIUM 9.6 9.2 9.1 8.7* 9.7  MG  --   --  1.9  --   --    Liver Function Tests:  Recent Labs Lab 10/12/15 0416 10/12/15 1635 10/13/15 0545 10/13/15 1720 10/14/15 0401  AST 41 36 33 37 28  ALT 33 28 24 24 17   ALKPHOS 115 106 94 90 61  BILITOT 2.8* 2.3* 1.9* 2.0* 1.7*  PROT 6.7 6.4* 5.7* 5.5* 6.0*  ALBUMIN 1.9* 1.8* 1.5* 1.5* 3.2*    No results for input(s): LIPASE, AMYLASE in the last 168 hours. No results for input(s): AMMONIA in the last 168 hours. CBC:  Recent Labs Lab 10/08/15 1121 10/08/2015 1325 10/12/15 0416 10/13/15 0545 10/13/15 1720 10/14/15 0401  WBC 7.5 10.2 8.9 10.2 11.9* 8.2  NEUTROABS 5.3 8.5*  --   --   --  5.7  HGB 13.2 13.4 12.0* 12.0* 11.4* 9.3*  HCT 38.0* 40.2 34.6* 33.7* 32.0* 26.8*  MCV 102.7* 107.5* 101.8* 100.6* 100.3* 100.8*  PLT 89* 101* 88* 87* 82* 70*   Cardiac Enzymes:   No results for input(s): CKTOTAL, CKMB, CKMBINDEX, TROPONINI in the last 168 hours. BNP (last 3 results) No results for input(s): BNP in the last 8760 hours.  ProBNP (last 3 results) No results for input(s): PROBNP in the last 8760 hours.  CBG: No results for input(s): GLUCAP in the last 168 hours.  Recent Results (from the past 240 hour(s))  Culture, body fluid-bottle     Status: None (Preliminary result)   Collection Time: 10/12/15  3:55 PM  Result Value Ref Range Status   Specimen Description PERITONEAL  Final   Special Requests BOTTLES DRAWN AEROBIC AND ANAEROBIC 63ml  Final   Gram Stain   Final    GRAM POSITIVE COCCI IN CLUSTERS IN BOTH AEROBIC AND ANAEROBIC BOTTLES CRITICAL RESULT CALLED TO, READ BACK BY AND VERIFIED WITH: TO JCLARK(RN) 10/13/15 BY TCLEVELAND 10/13/15 3:30AM    Culture   Final    NO GROWTH < 24 HOURS Performed at [facility]    Report Status PENDING  Incomplete  Gram stain     Status: None (Preliminary result)   Collection Time: 10/12/15  3:55 PM  Result Value Ref Range Status   Specimen Description PERITONEAL  Final   Special Requests NONE  Final   Gram Stain   Final    WBC PRESENT,BOTH PMN AND MONONUCLEAR GRAM POSITIVE COCCI IN CLUSTERS CRITICAL RESULT CALLED TO, READ BACK BY AND VERIFIED WITH: R.BALDWIN,RN 10/12/15 @1744  BY V.WILKINS CONFIRMED BY K.WOOTEN Performed at [facility]    Report Status PENDING  Incomplete  Culture, blood (routine x 2)      Status: None (Preliminary result)   Collection Time: 10/12/15  6:50 PM  Result Value Ref Range Status   Specimen Description BLOOD BLOOD RIGHT ARM  Final   Special Requests BOTTLES DRAWN AEROBIC ONLY 10CC  Final   Culture   Final    NO GROWTH < 24 HOURS Performed at [facility]    Report Status PENDING  Incomplete  MRSA PCR Screening     Status: Abnormal   Collection Time: 10/12/15  6:53 PM  Result Value Ref Range Status   MRSA by PCR POSITIVE (A) NEGATIVE Final    Comment:        The GeneXpert MRSA Assay (FDA approved for NASAL specimens only), is one component of a comprehensive MRSA colonization surveillance program. It is not intended to diagnose MRSA infection nor to guide or monitor treatment for MRSA infections. RESULT CALLED TO, READ BACK BY AND VERIFIED WITH: A.SAISON,RN  AT 2153 ON 10/12/15 BY W.SHEA   Culture, blood (routine x 2)     Status: None (Preliminary result)   Collection Time: 10/12/15  6:58 PM  Result Value Ref Range Status   Specimen Description BLOOD BLOOD LEFT ARM  Final   Special Requests BOTTLES DRAWN AEROBIC ONLY 10CC  Final   Culture   Final    NO GROWTH < 24 HOURS Performed at [facility]    Report Status PENDING  Incomplete  C difficile quick scan w PCR reflex     Status: None   Collection Time: 10/13/15  3:30 PM  Result Value Ref Range Status   C Diff antigen NEGATIVE NEGATIVE Final   C Diff toxin NEGATIVE NEGATIVE Final   C Diff interpretation Negative for toxigenic C. difficile  Final     Studies: No results found.  Scheduled Meds: . albumin human  12.5 g Intravenous Once  . cefTRIAXone (ROCEPHIN)  IV  2 g Intravenous Q24H  . Chlorhexidine Gluconate Cloth  6 each Topical Q0600  . feeding supplement (ENSURE ENLIVE)  237 mL Oral TID BM  . fluconazole  100 mg Oral Daily  . megestrol  400 mg Oral Daily  . midodrine  5 mg Oral TID WC  . mupirocin ointment  1 application Nasal BID  . sodium chloride  3 mL Intravenous  Q12H  . sodium chloride  1 g Oral TID WC  . vancomycin  750 mg Intravenous Q12H    Continuous Infusions: . sodium chloride 150 mL/hr at 10/14/15 0001     Time spent: 44mins  Nneka Blanda MD, PhD  [facility] Pager [phone]. If 7PM-7AM, please contact night-coverage at [email or web address], password [facility] 10/14/2015, 8:44 AM  LOS: 3 days

## 2015-10-15 ENCOUNTER — Other Ambulatory Visit: Payer: Self-pay

## 2015-10-15 ENCOUNTER — Encounter (HOSPITAL_COMMUNITY): Admission: AD | Disposition: E | Payer: Self-pay | Source: Ambulatory Visit | Attending: Internal Medicine

## 2015-10-15 DIAGNOSIS — R110 Nausea: Secondary | ICD-10-CM

## 2015-10-15 DIAGNOSIS — E860 Dehydration: Secondary | ICD-10-CM

## 2015-10-15 DIAGNOSIS — R17 Unspecified jaundice: Secondary | ICD-10-CM

## 2015-10-15 DIAGNOSIS — C249 Malignant neoplasm of biliary tract, unspecified: Secondary | ICD-10-CM

## 2015-10-15 DIAGNOSIS — A4902 Methicillin resistant Staphylococcus aureus infection, unspecified site: Secondary | ICD-10-CM

## 2015-10-15 DIAGNOSIS — I8501 Esophageal varices with bleeding: Secondary | ICD-10-CM | POA: Insufficient documentation

## 2015-10-15 DIAGNOSIS — N179 Acute kidney failure, unspecified: Secondary | ICD-10-CM

## 2015-10-15 DIAGNOSIS — I959 Hypotension, unspecified: Secondary | ICD-10-CM

## 2015-10-15 DIAGNOSIS — K7460 Unspecified cirrhosis of liver: Principal | ICD-10-CM

## 2015-10-15 DIAGNOSIS — I4891 Unspecified atrial fibrillation: Secondary | ICD-10-CM

## 2015-10-15 DIAGNOSIS — K920 Hematemesis: Secondary | ICD-10-CM

## 2015-10-15 LAB — COMPREHENSIVE METABOLIC PANEL
ALBUMIN: 2.4 g/dL — AB (ref 3.5–5.0)
ALK PHOS: 58 U/L (ref 38–126)
ALT: 16 U/L — AB (ref 17–63)
AST: 28 U/L (ref 15–41)
Anion gap: 12 (ref 5–15)
BILIRUBIN TOTAL: 2.2 mg/dL — AB (ref 0.3–1.2)
BUN: 79 mg/dL — AB (ref 6–20)
CALCIUM: 9.8 mg/dL (ref 8.9–10.3)
CO2: 18 mmol/L — ABNORMAL LOW (ref 22–32)
CREATININE: 2.66 mg/dL — AB (ref 0.61–1.24)
Chloride: 102 mmol/L (ref 101–111)
GFR calc Af Amer: 29 mL/min — ABNORMAL LOW (ref >60)
GFR, EST NON AFRICAN AMERICAN: 25 mL/min — AB (ref >60)
GLUCOSE: 154 mg/dL — AB (ref 65–99)
POTASSIUM: 4.2 mmol/L (ref 3.5–5.1)
Sodium: 132 mmol/L — ABNORMAL LOW (ref 135–145)
TOTAL PROTEIN: 5.4 g/dL — AB (ref 6.5–8.1)

## 2015-10-15 LAB — CBC
HCT: 29.6 % — ABNORMAL LOW (ref 39.0–52.0)
HEMOGLOBIN: 10.4 g/dL — AB (ref 13.0–17.0)
MCH: 36 pg — AB (ref 26.0–34.0)
MCHC: 35.1 g/dL (ref 30.0–36.0)
MCV: 102.4 fL — ABNORMAL HIGH (ref 78.0–100.0)
Platelets: 107 10*3/uL — ABNORMAL LOW (ref 150–400)
RBC: 2.89 MIL/uL — ABNORMAL LOW (ref 4.22–5.81)
RDW: 14.2 % (ref 11.5–15.5)
WBC: 12.8 10*3/uL — ABNORMAL HIGH (ref 4.0–10.5)

## 2015-10-15 LAB — GRAM STAIN

## 2015-10-15 LAB — CULTURE, BODY FLUID W GRAM STAIN -BOTTLE

## 2015-10-15 LAB — CULTURE, BODY FLUID-BOTTLE

## 2015-10-15 LAB — FOLATE RBC
Folate, Hemolysate: 441 ng/mL
Folate, RBC: 1729 ng/mL (ref >498)
Hematocrit: 25.5 % — ABNORMAL LOW (ref 37.5–51.0)

## 2015-10-15 LAB — PH, BODY FLUID: pH, Body Fluid: 6.9

## 2015-10-15 LAB — PREPARE RBC (CROSSMATCH)

## 2015-10-15 LAB — PROCALCITONIN: Procalcitonin: 3.01 ng/mL

## 2015-10-15 SURGERY — CANCELLED PROCEDURE

## 2015-10-15 MED ORDER — PANTOPRAZOLE SODIUM 40 MG IV SOLR
40.0000 mg | Freq: Two times a day (BID) | INTRAVENOUS | Status: DC
Start: 2015-10-15 — End: 2015-10-16
  Administered 2015-10-15 (×2): 40 mg via INTRAVENOUS
  Filled 2015-10-15 (×2): qty 40

## 2015-10-15 MED ORDER — AMIODARONE HCL IN DEXTROSE 360-4.14 MG/200ML-% IV SOLN
30.0000 mg/h | INTRAVENOUS | Status: DC
  Filled 2015-10-15: qty 200

## 2015-10-15 MED ORDER — CETYLPYRIDINIUM CHLORIDE 0.05 % MT LIQD
7.0000 mL | Freq: Two times a day (BID) | OROMUCOSAL | Status: DC
  Administered 2015-10-15 – 2015-10-16 (×3): 7 mL via OROMUCOSAL

## 2015-10-15 MED ORDER — MORPHINE SULFATE (PF) 2 MG/ML IV SOLN
2.0000 mg | INTRAVENOUS | Status: DC | PRN
  Administered 2015-10-15 (×2): 2 mg via INTRAVENOUS
  Filled 2015-10-15 (×3): qty 1

## 2015-10-15 MED ORDER — AMIODARONE IV BOLUS ONLY 150 MG/100ML
INTRAVENOUS | Status: AC
  Filled 2015-10-15: qty 100

## 2015-10-15 MED ORDER — LIDOCAINE HCL 2 % EX GEL
1.0000 "application " | Freq: Once | CUTANEOUS | Status: AC
  Administered 2015-10-15: 1 via URETHRAL
  Filled 2015-10-15: qty 5

## 2015-10-15 MED ORDER — AMIODARONE LOAD VIA INFUSION
150.0000 mg | Freq: Once | INTRAVENOUS | Status: AC
  Administered 2015-10-15: 150 mg via INTRAVENOUS
  Filled 2015-10-15: qty 83.34

## 2015-10-15 MED ORDER — SODIUM CHLORIDE 0.9 % IV BOLUS (SEPSIS)
500.0000 mL | Freq: Once | INTRAVENOUS | Status: AC
  Administered 2015-10-15: 500 mL via INTRAVENOUS

## 2015-10-15 MED ORDER — ALBUMIN HUMAN 25 % IV SOLN
37.5000 g | Freq: Once | INTRAVENOUS | Status: AC
  Administered 2015-10-15: 37.5 g via INTRAVENOUS
  Filled 2015-10-15: qty 150

## 2015-10-15 MED ORDER — SODIUM BICARBONATE 650 MG PO TABS
650.0000 mg | ORAL_TABLET | Freq: Three times a day (TID) | ORAL | Status: DC
  Administered 2015-10-15: 650 mg via ORAL
  Filled 2015-10-15: qty 1

## 2015-10-15 MED ORDER — LORAZEPAM 2 MG/ML IJ SOLN
0.5000 mg | Freq: Four times a day (QID) | INTRAMUSCULAR | Status: DC | PRN
  Administered 2015-10-15 – 2015-10-17 (×2): 0.5 mg via INTRAVENOUS
  Filled 2015-10-15 (×2): qty 1

## 2015-10-15 MED ORDER — SODIUM CHLORIDE 0.9 % IV SOLN
Freq: Once | INTRAVENOUS | Status: AC
  Administered 2015-10-15: 15:00:00 via INTRAVENOUS

## 2015-10-15 MED ORDER — ALBUMIN HUMAN 25 % IV SOLN
50.0000 g | Freq: Once | INTRAVENOUS | Status: AC
  Administered 2015-10-15: 50 g via INTRAVENOUS
  Filled 2015-10-15: qty 50

## 2015-10-15 MED ORDER — AMIODARONE HCL IN DEXTROSE 360-4.14 MG/200ML-% IV SOLN
60.0000 mg/h | INTRAVENOUS | Status: DC
  Administered 2015-10-15 (×2): 60 mg/h via INTRAVENOUS
  Filled 2015-10-15 (×2): qty 200

## 2015-10-15 MED ORDER — CHLORHEXIDINE GLUCONATE 0.12 % MT SOLN
15.0000 mL | Freq: Two times a day (BID) | OROMUCOSAL | Status: DC
  Administered 2015-10-15 – 2015-10-17 (×5): 15 mL via OROMUCOSAL
  Filled 2015-10-15 (×5): qty 15

## 2015-10-15 MED ORDER — VITAMIN K1 10 MG/ML IJ SOLN
10.0000 mg | Freq: Once | INTRAMUSCULAR | Status: AC
  Administered 2015-10-15: 10 mg via INTRAVENOUS
  Filled 2015-10-15: qty 1

## 2015-10-15 MED ORDER — SODIUM CHLORIDE 0.9 % IV BOLUS (SEPSIS): 1000.0000 mL | INTRAVENOUS | Status: DC

## 2015-10-15 MED ORDER — DIGOXIN 0.25 MG/ML IJ SOLN
0.1250 mg | Freq: Once | INTRAMUSCULAR | Status: AC
  Administered 2015-10-15: 0.125 mg via INTRAVENOUS
  Filled 2015-10-15: qty 0.5

## 2015-10-15 MED ORDER — SODIUM CHLORIDE 0.9 % IV SOLN
50.0000 ug/h | INTRAVENOUS | Status: DC
  Administered 2015-10-15 – 2015-10-17 (×5): 50 ug/h via INTRAVENOUS
  Filled 2015-10-15 (×10): qty 1

## 2015-10-15 MED ORDER — LORAZEPAM 2 MG/ML IJ SOLN
2.0000 mg | Freq: Once | INTRAMUSCULAR | Status: AC
  Administered 2015-10-15: 2 mg via INTRAVENOUS

## 2015-10-15 MED ORDER — MORPHINE SULFATE (PF) 2 MG/ML IV SOLN
2.0000 mg | INTRAVENOUS | Status: DC | PRN
  Administered 2015-10-15 – 2015-10-16 (×4): 2 mg via INTRAVENOUS
  Filled 2015-10-15 (×3): qty 1

## 2015-10-15 NOTE — Progress Notes (Signed)
Date: October 15, 2015 Chart reviewed for concurrent status and case management needs. Will continue to follow patient for changes and needs: remains septic and hypotensive Velva Harman, RN, BSN, [place]   [phone]

## 2015-10-15 NOTE — Consult Note (Signed)
    Consultation  Referring Provider: Dr Erlinda Hong Primary Care Physician:  No PCP Per Patient Primary Gastroenterologist:  Dr.Jacobs  Reason for Consultation:  Acute Variceal bleed.  GI asked to consult this afternoon on this 60 year old white male with recent new diagnosis of cholangiocarcinoma made about one month ago. He has undergone internal/external biliary drainage and has had a complicated course since. He had been awaiting surgical consultation at [facility] that has had a significant decline in status over the past month with ongoing abdominal discomfort fatigue weakness current hyponatremia secondary to high  biliary drainage output. He was readmitted on 10/01/2015 with failure to thrive, weakness and hypotension and hyponatremia. Found to have a staph bacteremia and peritonitis/SBP. He was transferred to the ICU last evening for hypotension and atrial fibrillation. He has developed progressive renal failure. This afternoon patient vomited about 500 mL of dark red blood. He is receiving packed RBCs and FFP and has been started on octreotide. He remains hypotensive and INR checked yesterday as up to 2.96, a dramatic  change from 1.63  3 weeks ago. I suspect he is also developing more fulminant hepatic failure. Patient has known large esophageal varices documented on CT scan. He has not had prior EGD. Patient was seen by Oncology earlier today and given decline in his overall status with multiorgan failure was felt that he is an appropriate candidate for comfort care and DO NOT RESUSCITATE. Critical Care is managing and at this time plans no intubation, no CPR and noninvasive management with the expectation that patient is actively dying and may die  today. Decision has been made not to pursue attempt at endoscopic control of variceal bleeding as unfortunately not likely to prolong his life. Discussed at length with Critical care, family also in agreement with management. All management per Critical  Care and GI will not be following   Amy Esterwood  10/26/2015, 2:51 PM  GI ATTENDING  As above  Docia Chuck. Geri [place]in[place]., M.D. [facility] Division of Gastroenterology

## 2015-10-15 NOTE — Progress Notes (Signed)
PULMONARY / CRITICAL CARE MEDICINE   Name: Eric Schroeder MRN: [id number] DOB: 1956-01-25    ADMISSION DATE:  09/16/2015 CONSULTATION DATE:  10/13/15  REFERRING MD:  Erlinda Hong  CHIEF COMPLAINT:  Low blood pressure   BRIEF:   60 y/o male with cholangiocarcinoma hospitalized 12/30 with encephalopathy and AKI due to staph peritonitis.   SUBJECTIVE:  Appears to be actively dying. Vomiting large blood clotts.   VITAL SIGNS: BP 113/66 mmHg  Pulse 101  Temp(Src) 98.2 F (36.8 C) (Oral)  Resp 18  Ht '5\' 9"'$  (1.753 m)  Wt 84.1 kg (185 lb 6.5 oz)  BMI 27.37 kg/m2  SpO2 100%  HEMODYNAMICS:    VENTILATOR SETTINGS:    INTAKE / OUTPUT: I/O last 3 completed shifts: In: 5890 [I.V.:5540; IV Piggyback:350] Out: 6063 [Urine:155; Drains:4990]  PHYSICAL EXAMINATION:  Gen: chronically ill appearing-->now critically ill and actively dying  HENT: NCAT OP Clear, temporal wasting  PULM: CTA B, w/ scattered rhonchi that cl w/ cough  CV: RRR, no mgr GI: BS+, soft, tender, + marked hematemesis  MSK: normal bulk and tone Neuro: A&Ox4, MAEW Derm: no rash, grey color   LABS:  BMET  Recent Labs Lab 10/14/15 0401 10/14/15 1820 10/16/2015 0530  NA 133* 133* 132*  K 4.2 4.4 4.2  CL 104 103 102  CO2 20* 19* 18*  BUN 61* 77* 79*  CREATININE 2.35* 2.55* 2.66*  GLUCOSE 104* 84 154*    Electrolytes  Recent Labs Lab 10/13/15 0545  10/14/15 0401 10/14/15 1820 11/09/2015 0530  CALCIUM 9.1  < > 9.7 10.1 9.8  MG 1.9  --   --   --   --   < > = values in this interval not displayed.  CBC  Recent Labs Lab 10/13/15 1720 10/14/15 0401 10/14/15 0428 10/23/2015 0530  WBC 11.9* 8.2  --  12.8*  HGB 11.4* 9.3*  --  10.4*  HCT 32.0* 26.8* 25.5* 29.6*  PLT 82* 70*  --  107*    Coag's  Recent Labs Lab 10/14/15 0401  INR 2.39*    Sepsis Markers  Recent Labs Lab 10/13/15 0545  10/13/15 1930 10/13/15 2220 10/14/15 0401 11/04/2015 0530  LATICACIDVEN  --   < > 2.2* 2.9* 2.3*  --    PROCALCITON 2.13  --   --   --   --  3.01  < > = values in this interval not displayed.  ABG No results for input(s): PHART, PCO2ART, PO2ART in the last 168 hours.  Liver Enzymes  Recent Labs Lab 10/13/15 1720 10/14/15 0401 11/04/2015 0530  AST 37 28 28  ALT 24 17 16*  ALKPHOS 90 61 58  BILITOT 2.0* 1.7* 2.2*  ALBUMIN 1.5* 3.2* 2.4*    Cardiac Enzymes No results for input(s): TROPONINI, PROBNP in the last 168 hours.  Glucose No results for input(s): GLUCAP in the last 168 hours.  Imaging No results found.   STUDIES:    CULTURES: 1/1 C. difficile > negative 12/31 Blood cultures 2 > negative  12/31 Peritoneal fluid > staph   ANTIBIOTICS: 12/31 Vancomycin  12/31 Ceftriaxone 1/1 diflucan >  SIGNIFICANT EVENTS:   LINES/TUBES: Biliary drain in place peripheral IVs  DISCUSSION:  60 year old Caucasian male with Hep C cirrhosis, cholangiocarcinoma who presented with weakness and dehydration. He had a paracentesis performed and had approximately 4.2 L removed. Admitted w/ hepato-renal syndrome and progressive decline w/ worsening renal failure and metabolic acidosis. Now acutely bleeding; on top of above mentioned decline. He appears  to be actively dying. Spoke to his significant other; as well as the patient and his parents. He will not survive. He is not a candidate for sedation or aggressive interventions. Have made him DNR. We will start octreotide, give blood, and FFP. No pressors, CPR OR central access. He is not stable enough for EGD.   ASSESSMENT / PLAN:   Hypotension in the setting of cirrhosis and spontaneous bacterial peritonitis-->now hemorrhagic shock > little to offer;  He is now DNR > he is not stable for EGD. Don't think he would survive sedation for this & if intubated would be a difficult extubation.  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  Progressive metabolic acidosis.  Plan: Transfuse blood & FFP STOP NSAIDS, discontinued ketorolac I&O Not HD candidate   Cholangiocarcinoma of the biliary tract > prognosis seems poor considering severity of this illness and baseline cirrhosis & now what appears to be progressive End-stage liver disease.  Recent biliary obstruction status post drain placement Plan:   Transfuse  NPO Drain per IR  Leukocytosis, likely reactive to SBP Thrombocytopenia secondary to cirrhosis Macrocytic anemia, initially not bleeding-->1/3 developed acute UGIB Plan:  Monitor for bleeding   SBP > staph now, recent fungal? Plan:   Discontinue ceftriaxone, continue vancomycin, diflucan F/u cultures Narrow antibiotics based on speciation of cultures    FAMILY  - Updates: Met with long time girlfriend bedside 1/2   - Inter-disciplinary family meet or Palliative Care meeting due by:  day 7   11/03/2015, 2:54 PM   STAFF NOTE: I, Merrie Roof, MD FACP have personally reviewed patient's available data, including medical history, events of note, physical examination and test results as part of my evaluation. I have discussed with resident/NP and other care providers such as pharmacist, RN and RRT. In addition, I personally evaluated patient and elicited key findings of: he is actively dying, noted large volume blood, 1 unit prbc given, lethargic, no distress currently, no options for therapy in future, would provide FFP x 2, add vit K , NO role NGT per family even for palliation, NO role pressors, full NCB, would avoid fluid bolus which will overload him, any signs distress pain, morphine drip immediate, I updated "wife" parents, they do not want him to suffer and want NO heroics, would NOT give ANY further products, they agree and no escalation O2, comfort if fails further which is likely,  will sign off, call if needed, NEEDS COMFORT WITH ANY DECLINES< AND NO MORE PRODUCTS, ok for octreotid and medical management for now The patient is critically ill with multiple organ systems failure and requires high complexity decision making for assessment and support, frequent evaluation and titration of therapies, application of advanced monitoring technologies and extensive interpretation of multiple databases.   Critical Care Time devoted to patient care services described in this note is35 Minutes. This time reflects time of care of this signee: Merrie Roof, MD FACP. This critical care time does not reflect procedure time, or teaching time or supervisory time of PA/NP/Med student/Med Resident etc but could involve care discussion time. Rest per NP/medical resident whose note is outlined above and that I agree with   Lavon Paganini. Titus Mould, MD, [place] Pgr: [facility] 11/03/2015 4:51 PM

## 2015-10-15 NOTE — Progress Notes (Signed)
Eric Schroeder   DOB:10-07-1956   [id number]   [id number]  Subjective: saw pt on the floor, he was admitted from my office last Friday, now in ICU for hypotension, had AF with RVR last night, better now. Very fatigued   Objective:  Filed Vitals:   11/05/2015 0700 10/22/2015 0800  BP: 102/57   Pulse: 87   Temp:  97.9 F (36.6 C)  Resp: 20     Body mass index is 27.37 kg/(m^2).  Intake/Output Summary (Last 24 hours) at 11/07/2015 0827 Last data filed at 10/16/2015 0700  Gross per 24 hour  Intake   3800 ml  Output   3915 ml  Net   -115 ml     Sclerae mild jaundice   Oropharynx clear  No peripheral adenopathy  Lungs clear -- no rales or rhonchi  Heart regular rate and rhythm  Abdomen soft, (+) biliary drainage open and draining   MSK no focal spinal tenderness, no peripheral edema  Neuro: lethargic, able to answer some simple questions    CBG (last 3)  No results for input(s): GLUCAP in the last 72 hours.   Labs:  Lab Results  Component Value Date   WBC 12.8* 11/03/2015   HGB 10.4* 10/30/2015   HCT 29.6* 10/16/2015   MCV 102.4* 10/27/2015   PLT 107* 11/03/2015   NEUTROABS 5.7 10/14/2015    @LASTCHEMISTRY @  Urine Studies No results for input(s): UHGB, CRYS in the last 72 hours.  Invalid input(s): UACOL, UAPR, USPG, UPH, UTP, UGL, UKET, UBIL, UNIT, UROB, [place], UEPI, UWBC, Duwayne Heck [place], [place]  Basic Metabolic Panel:  Recent Labs Lab 10/13/15 0545 10/13/15 1720 10/14/15 0401 10/14/15 1820 10/30/2015 0530  NA 127* 128* 133* 133* 132*  K 4.4 4.1 4.2 4.4 4.2  CL 96* 99* 104 103 102  CO2 22 22 20* 19* 18*  GLUCOSE 117* 113* 104* 84 154*  BUN 56* 62* 61* 77* 79*  CREATININE 1.95* 2.27* 2.35* 2.55* 2.66*  CALCIUM 9.1 8.7* 9.7 10.1 9.8  MG 1.9  --   --   --   --    GFR Estimated Creatinine Clearance: 30.3 mL/min (by C-G formula based on Cr of 2.66). Liver Function Tests:  Recent Labs Lab 10/12/15 1635 10/13/15 0545 10/13/15 1720 10/14/15 0401  11/08/2015 0530  AST 36 33 37 28 28  ALT 28 24 24 17  16*  ALKPHOS 106 94 90 61 58  BILITOT 2.3* 1.9* 2.0* 1.7* 2.2*  PROT 6.4* 5.7* 5.5* 6.0* 5.4*  ALBUMIN 1.8* 1.5* 1.5* 3.2* 2.4*   No results for input(s): LIPASE, AMYLASE in the last 168 hours. No results for input(s): AMMONIA in the last 168 hours. Coagulation profile  Recent Labs Lab 10/14/15 0401  INR 2.39*    CBC:  Recent Labs Lab 10/08/15 1121 09/12/2015 1325 10/12/15 0416 10/13/15 0545 10/13/15 1720 10/14/15 0401 10/20/2015 0530  WBC 7.5 10.2 8.9 10.2 11.9* 8.2 12.8*  NEUTROABS 5.3 8.5*  --   --   --  5.7  --   HGB 13.2 13.4 12.0* 12.0* 11.4* 9.3* 10.4*  HCT 38.0* 40.2 34.6* 33.7* 32.0* 26.8* 29.6*  MCV 102.7* 107.5* 101.8* 100.6* 100.3* 100.8* 102.4*  PLT 89* 101* 88* 87* 82* 70* 107*   Cardiac Enzymes: No results for input(s): CKTOTAL, CKMB, CKMBINDEX, TROPONINI in the last 168 hours. BNP: Invalid input(s): POCBNP CBG: No results for input(s): GLUCAP in the last 168 hours. D-Dimer No results for input(s): DDIMER in the last 72 hours. Hgb  A1c No results for input(s): HGBA1C in the last 72 hours. Lipid Profile No results for input(s): CHOL, HDL, LDLCALC, TRIG, CHOLHDL, LDLDIRECT in the last 72 hours. Thyroid function studies No results for input(s): TSH, T4TOTAL, T3FREE, THYROIDAB in the last 72 hours.  Invalid input(s): FREET3 Anemia work up  Recent Labs  10/14/15 Brady*   Microbiology Recent Results (from the past 240 hour(s))  Culture, body fluid-bottle     Status: None (Preliminary result)   Collection Time: 10/12/15  3:55 PM  Result Value Ref Range Status   Specimen Description PERITONEAL  Final   Special Requests BOTTLES DRAWN AEROBIC AND ANAEROBIC 78ml  Final   Gram Stain   Final    GRAM POSITIVE COCCI IN CLUSTERS IN BOTH AEROBIC AND ANAEROBIC BOTTLES CRITICAL RESULT CALLED TO, READ BACK BY AND VERIFIED WITH: TO JCLARK(RN) 10/13/15 BY TCLEVELAND 10/13/15 3:30AM    Culture    Final    STAPHYLOCOCCUS AUREUS Performed at [facility]    Report Status PENDING  Incomplete  Gram stain     Status: None (Preliminary result)   Collection Time: 10/12/15  3:55 PM  Result Value Ref Range Status   Specimen Description PERITONEAL  Final   Special Requests NONE  Final   Gram Stain   Final    WBC PRESENT,BOTH PMN AND MONONUCLEAR GRAM POSITIVE COCCI IN CLUSTERS CRITICAL RESULT CALLED TO, READ BACK BY AND VERIFIED WITH: R.BALDWIN,RN 10/12/15 @1744  BY V.WILKINS CONFIRMED BY K.WOOTEN Performed at [facility]    Report Status PENDING  Incomplete  Culture, blood (routine x 2)     Status: None (Preliminary result)   Collection Time: 10/12/15  6:50 PM  Result Value Ref Range Status   Specimen Description BLOOD BLOOD RIGHT ARM  Final   Special Requests BOTTLES DRAWN AEROBIC ONLY 10CC  Final   Culture   Final    NO GROWTH 2 DAYS Performed at [facility]    Report Status PENDING  Incomplete  MRSA PCR Screening     Status: Abnormal   Collection Time: 10/12/15  6:53 PM  Result Value Ref Range Status   MRSA by PCR POSITIVE (A) NEGATIVE Final    Comment:        The GeneXpert MRSA Assay (FDA approved for NASAL specimens only), is one component of a comprehensive MRSA colonization surveillance program. It is not intended to diagnose MRSA infection nor to guide or monitor treatment for MRSA infections. RESULT CALLED TO, READ BACK BY AND VERIFIED WITH: A.SAISON,RN AT 2153 ON 10/12/15 BY W.SHEA   Culture, blood (routine x 2)     Status: None (Preliminary result)   Collection Time: 10/12/15  6:58 PM  Result Value Ref Range Status   Specimen Description BLOOD BLOOD LEFT ARM  Final   Special Requests BOTTLES DRAWN AEROBIC ONLY 10CC  Final   Culture   Final    NO GROWTH 2 DAYS Performed at [facility]    Report Status PENDING  Incomplete  C difficile quick scan w PCR reflex     Status: None   Collection Time: 10/13/15  3:30 PM  Result  Value Ref Range Status   C Diff antigen NEGATIVE NEGATIVE Final   C Diff toxin NEGATIVE NEGATIVE Final   C Diff interpretation Negative for toxigenic C. difficile  Final      Studies:  No results found.  Assessment: 59 y.o. male, without significant past medical history, not a heavy drinker, was recently  diagnosed with liver cirrhosis, and Cholangiocarcinoma, probably unresectable.  I reviewed his record extensively  1. MRSA SBP 2. Hypotension, secondary to large volume biliary drainage  3. AKI secondary to dehydration 4. 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 Liver cirrhosis, etiology unclear, with large volume ascites and esophageal versus, and encephalopathy 6. Obstructive jaundice, secondary to #1 7. AF with RVR 8. Malnutrition    Plan: -I had a long conversation with patient and his) at the bedside this morning. Giving his quick deterioration, multiple organ failure, he is unlikely to be a candidate for surgery or chemotherapy.  -I recommend DO NOT RESUSCITATE and DO NOT INTUBATE. Patient and his firlfriend voiced good understanding, they will discuss about it.  -I appreciate the dedicated care from hospitalist and ICU teams   I will follow up when he is in house.   [name], MD 10/31/2015 8:32 AM

## 2015-10-15 NOTE — Progress Notes (Addendum)
Patient with sudden onset of hematemesis with large clots. VSS.  Dr. Erlinda Hong paged and in to see patient. CCM and GI contacted by Dr. Erlinda Hong and both in to see patient quickly as well. Now with 2 additional large episodes of hematemesis with very large clots.

## 2015-10-15 NOTE — Progress Notes (Signed)
Patient ID: Eric Schroeder, male   DOB: 06-25-1956, 60 y.o.   MRN: [id number]           Subjective: Pt lethargic, jaundiced; will slowly answer questions in muffled voice; family in room   Allergies: Review of patient's allergies indicates no known allergies.  Medications: Prior to Admission medications   Medication Sig Start Date End Date Taking? Authorizing Provider  acetaminophen (TYLENOL) 500 MG tablet Take 500 mg by mouth every 6 (six) hours as needed for moderate pain.   Yes Historical Provider, MD  amoxicillin-clavulanate (AUGMENTIN) 875-125 MG tablet Take 1 tablet by mouth every 12 (twelve) hours. 10/01/15  Yes Geradine Girt, DO  fluconazole (DIFLUCAN) 200 MG tablet Take 1 tablet (200 mg total) by mouth daily. 09/23/15  Yes Nita Sells, MD  sodium chloride 0.9 % injection 10 mLs by Other route daily. drain 10/01/15  Yes Geradine Girt, DO  sodium chloride 1 G tablet Take 1 tablet (1 g total) by mouth 2 (two) times daily with a meal. 09/23/15  Yes Nita Sells, MD     Vital Signs: BP 117/56 mmHg  Pulse 82  Temp(Src) 97.9 F (36.6 C) (Oral)  Resp 19  Ht 5\' 9"  (1.753 m)  Wt 185 lb 6.5 oz (84.1 kg)  BMI 27.37 kg/m2  SpO2 100%  Physical Exam jaundiced;abd distended;  biliary drain intact, insertion site ok, mild erythema at site,NT; output 25 cc blood tinged bile today; 4 liters yesterday; drain flushes without difficulty  Imaging: US Paracentesis  10/12/2015  INDICATION: ascites cholangiocarcinoma EXAM: ULTRASOUND-GUIDED PARACENTESIS COMPARISON:  Previous paracentesis MEDICATIONS: 10 cc 1% lidocaine COMPLICATIONS: None immediate TECHNIQUE: Informed written consent was obtained from the patient after a discussion of the risks, benefits and alternatives to treatment. A timeout was performed prior to the initiation of the procedure. Initial ultrasound scanning demonstrates a large amount of ascites within the right lower abdominal quadrant. The right lower  abdomen was prepped and draped in the usual sterile fashion. 1% lidocaine with epinephrine was used for local anesthesia. Under direct ultrasound guidance, a 19 gauge, 7-cm, Yueh catheter was introduced. An ultrasound image was saved for documentation purposed. The paracentesis was performed. The catheter was removed and a dressing was applied. The patient tolerated the procedure well without immediate post procedural complication. FINDINGS: A total of approximately 4.2 liters of yellow fluid was removed. Samples were sent to the laboratory as requested by the clinical team. IMPRESSION: Successful ultrasound-guided paracentesis yielding 4.2 liters of peritoneal fluid. Read By:  Lavonia Drafts [facility] Electronically Signed   By: Marybelle Killings M.D.   On: 10/12/2015 12:52    Labs:  CBC:  Recent Labs  10/13/15 0545 10/13/15 1720 10/14/15 0401 11/07/2015 0530  WBC 10.2 11.9* 8.2 12.8*  HGB 12.0* 11.4* 9.3* 10.4*  HCT 33.7* 32.0* 26.8* 29.6*  PLT 87* 82* 70* 107*    COAGS:  Recent Labs  09/15/15 1208  09/20/15 0530 09/22/15 0415 09/23/15 0425 10/14/15 0401  INR 1.57*  < > 1.64* 1.64* 1.67* 2.39*  APTT 38*  --   --   --   --   --   < > = values in this interval not displayed.  BMP:  Recent Labs  10/13/15 1720 10/14/15 0401 10/14/15 1820 10/25/2015 0530  NA 128* 133* 133* 132*  K 4.1 4.2 4.4 4.2  CL 99* 104 103 102  CO2 22 20* 19* 18*  GLUCOSE 113* 104* 84 154*  BUN 62* 61* 77* 79*  CALCIUM 8.7* 9.7 10.1 9.8  CREATININE 2.27* 2.35* 2.55* 2.66*  GFRNONAA 30* 29* 26* 25*  GFRAA 35* 33* 30* 29*    LIVER FUNCTION TESTS:  Recent Labs  10/13/15 0545 10/13/15 1720 10/14/15 0401 10/20/2015 0530  BILITOT 1.9* 2.0* 1.7* 2.2*  AST 33 37 28 28  ALT 24 24 17  16*  ALKPHOS 94 90 61 58  PROT 5.7* 5.5* 6.0* 5.4*  ALBUMIN 1.5* 1.5* 3.2* 2.4*    Assessment and Plan: Cholangiocarcinoma, 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 Xu/Watts decision made to cap biliary drain today in view of recent hypotension/ dehydration/ electrolyte abnormalities/high biliary drain output; if fails capping will need to be placed back to bag drainage. Prn paracentesis. Further intervention( biliary stenting) will depend upon pt's overall recovery from recent decline/MRSA SBP.    Signed: D. Rowe Robert 10/13/2015, 11:00 AM   I spent a total of 15 minutes at the the patient's bedside AND on the patient's hospital floor or unit, greater than 50% of which was counseling/coordinating care for biliary drain

## 2015-10-15 NOTE — Progress Notes (Signed)
Foley ordered earlier in day. Multiple attempts have been made to place without success. I&O cath also tried. Pt has bladder scan showing over 500cc. Urology, Dr. Ottis Stain, called and he will speak with RN about plans to place catheter tonight.

## 2015-10-15 NOTE — Progress Notes (Signed)
Shift event summary: Pt went into Afib with RVR at beginning of shift. Confirmed by 12 lead EKG. Pt's BPs soft and has had difficulty with hypotension since admission so didn't want to use Cardizem. Tried Amiodarone which lowered the HR some, but never really to goal. In early am, pt's BP dropped further, so amiodarone stopped. Digoxin used IV x 1. Albumin 37.5 IV x 1.  HR in the 80s after Digoxin. BP in the 80s.  Drain continues with high output, so etiology of hypotension continues.  Follow closely.  Clance Boll, NP [facility]

## 2015-10-15 NOTE — Progress Notes (Signed)
Bladder scan yielded 642ml of urine. Order received to place foley. RN and charge RN made foley placement attempts but were unable to successfully advance catheter. Family does not want further attempts at this time. Dr. Erlinda Hong made aware.

## 2015-10-15 NOTE — Progress Notes (Addendum)
PROGRESS NOTE  Eric Schroeder [id number] DOB: Jun 25, 1956 DOA: 09/16/2015 PCP: No PCP Per Patient  HPI/Recap of past 24 hours:  Overnight developed afib/RVR, received dig, now in sinus rhythm,  Documented drain output almost 4liters last 24hrs. Urine output 125 last 24hrs, bp remain borderline low  At 2:30 pm patient vomiting bright red blood in large volume, per RN at least 500cc, patient know he is in the hospital , reports it is 73.     Assessment/Plan: Active Problems:   Ascites   Biliary obstruction due to malignant neoplasm   Hepatic cirrhosis (HCC)   Cholangiocarcinoma of biliary tract (HCC)   Hyponatremia   Hyperbilirubinemia   Hypoalbuminemia due to protein-calorie malnutrition (HCC)   Dehydration   Cholangiocarcinoma (HCC)   SBP (spontaneous bacterial peritonitis) (HCC)   Vomiting blood ( at 2:30pm): with knownesophageal varices on CT ab obtained from 12/3. Stat GI consult/ transfer patient to icu. blood transfusion/octreotide/ppi, i have talked to [facility] Dr Henrene Pastor and amy over the phone.  PAF; received dig, now NSR.   ARF/hypotensive/tachycardia,  Likely multifactorial from high biliary drain output, infection, supratherapeutic vanc levellarge volume paracentesis/hepatorenal?  ,  Continue ivf, albumin and midodrine.  Add sodium bicarb. Appreciate critical care input.  Lactic acidosis: in the setting of infection, arf, and hepatic dysfunction.   Dehydration/hyponatremia/increased biliary drain:  ivf, salt tabs, discussed with Intervnetional radiology Dr. Barbie Banner who recommend that patient may need paracentesis more frequent and intermittent capping of biliary drain to prevent dehydration, over all poor prognosis due to large obstructing cbd cancer with background cirrhosis. Continue hydration and salt tabs, 1/3 i have talked to interventional radiologist Dr. Pascal Lux, IR for possible biliary drain capping today due to large volume loss from the drain (almost  4liter in the last 24hrs)  Ascites/SBP: s/p US guided paracentesis on 12/31, fluids studies cell count with elevated neutrophil> 500, fluids culture +MRSA, start vanc/rocephin for sbp, restarted diflucan due to clinical deterioration (h/o rare budding yeast, candida albicans in peritoneal fluids on 12/6 and 12/8) Rocephin d/ed on 1/2 per critical care rec's. vanc level elevated, pharmacy monitoring. Will need repeat paracentesis at some point, eventually may need palliative indwelling drain for recurrent ascites, once sbp treated and resolved.  Cirrhosis: with elevated INR, thrombocytopenia, ascites.   Diarrhea? 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 Further management defer to oncology.  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   DVT prophylaxis: scd  Consultants:  IR Oncology (dr Marin Olp) Critical care  Code Status: full   Family Communication: Patient in [place] girlfriend over the phone at [phone].  Disposition Plan: pending, poor prognosis   Procedures:  US guided paracentesis on 12/31 with removal 4.2 liter yellow fluids  Antibiotics:  Rocephin from 12/31 to 1/2 (stopped per critical care rec)  vanc from 12/31-    Objective: BP 102/57 mmHg  Pulse 87  Temp(Src) 98 F (36.7 C) (Oral)  Resp 20  Ht 5\' 9"  (1.753 m)  Wt 185 lb 6.5 oz (84.1 kg)  BMI 27.37 kg/m2  SpO2 100%  Intake/Output Summary (Last 24 hours) at 10/31/2015 [id number]  Last data filed at 11/10/2015 0700  Gross per 24 hour  Intake   3800 ml  Output   3915 ml  Net   -115  ml   Filed Weights   10/13/15 2248 10/14/15 0535 10/22/2015 0400  Weight: 195 lb 12.3 oz (88.8 kg) 182 lb 1.6 oz (82.6 kg) 185 lb 6.5 oz (84.1 kg)    Exam:   General: Thin frail, sick , slight confusion  Eyes: PERRL  ENT: unremarkable  Neck: supple, no JVD  Cardiovascular: RRR  Respiratory: CTABL  Abdomen: ascites reaccumulated,  no significant tender, no rebound, positive bowel sounds, +biliary drain   Skin: no rash  Musculoskeletal: No edema  Psychiatric: calm/cooperative  Neurologic: no focal findings , chronic right foot drop  Data Reviewed: Basic Metabolic Panel:  Recent Labs Lab 10/13/15 0545 10/13/15 1720 10/14/15 0401 10/14/15 1820 10/22/2015 0530  NA 127* 128* 133* 133* 132*  K 4.4 4.1 4.2 4.4 4.2  CL 96* 99* 104 103 102  CO2 22 22 20* 19* 18*  GLUCOSE 117* 113* 104* 84 154*  BUN 56* 62* 61* 77* 79*  CREATININE 1.95* 2.27* 2.35* 2.55* 2.66*  CALCIUM 9.1 8.7* 9.7 10.1 9.8  MG 1.9  --   --   --   --    Liver Function Tests:  Recent Labs Lab 10/12/15 1635 10/13/15 0545 10/13/15 1720 10/14/15 0401 11/04/2015 0530  AST 36 33 37 28 28  ALT 28 24 24 17  16*  ALKPHOS 106 94 90 61 58  BILITOT 2.3* 1.9* 2.0* 1.7* 2.2*  PROT 6.4* 5.7* 5.5* 6.0* 5.4*  ALBUMIN 1.8* 1.5* 1.5* 3.2* 2.4*   No results for input(s): LIPASE, AMYLASE in the last 168 hours. No results for input(s): AMMONIA in the last 168 hours. CBC:  Recent Labs Lab 10/08/15 1121 10/08/2015 1325 10/12/15 0416 10/13/15 0545 10/13/15 1720 10/14/15 0401 10/13/2015 0530  WBC 7.5 10.2 8.9 10.2 11.9* 8.2 12.8*  NEUTROABS 5.3 8.5*  --   --   --  5.7  --   HGB 13.2 13.4 12.0* 12.0* 11.4* 9.3* 10.4*  HCT 38.0* 40.2 34.6* 33.7* 32.0* 26.8* 29.6*  MCV 102.7* 107.5* 101.8* 100.6* 100.3* 100.8* 102.4*  PLT 89* 101* 88* 87* 82* 70* 107*   Cardiac Enzymes:   No results for input(s): CKTOTAL, CKMB, CKMBINDEX, TROPONINI in the last 168 hours. BNP (last 3 results) No results for input(s): BNP in  the last 8760 hours.  ProBNP (last 3 results) No results for input(s): PROBNP in the last 8760 hours.  CBG: No results for input(s): GLUCAP in the last 168 hours.  Recent Results (from the past 240 hour(s))  Culture, body fluid-bottle     Status: None (Preliminary result)   Collection Time: 10/12/15  3:55 PM  Result Value Ref Range Status   Specimen Description PERITONEAL  Final   Special Requests BOTTLES DRAWN AEROBIC AND ANAEROBIC 33ml  Final   Gram Stain   Final    GRAM POSITIVE COCCI IN CLUSTERS IN BOTH AEROBIC AND ANAEROBIC BOTTLES CRITICAL RESULT CALLED TO, READ BACK BY AND VERIFIED WITH: TO JCLARK(RN) 10/13/15 BY TCLEVELAND 10/13/15 3:30AM    Culture   Final    STAPHYLOCOCCUS AUREUS Performed at [facility]    Report Status PENDING  Incomplete  Gram stain     Status: None (Preliminary result)   Collection Time: 10/12/15  3:55 PM  Result Value Ref Range Status   Specimen Description PERITONEAL  Final   Special Requests NONE  Final   Gram Stain   Final    WBC PRESENT,BOTH PMN AND MONONUCLEAR GRAM POSITIVE COCCI IN CLUSTERS CRITICAL RESULT CALLED TO, READ BACK BY AND VERIFIED WITH: R.BALDWIN,RN 10/12/15 @1744  BY V.WILKINS CONFIRMED BY K.WOOTEN Performed at [facility]    Report Status PENDING  Incomplete  Culture, blood (routine x 2)     Status: None (Preliminary result)   Collection Time: 10/12/15  6:50 PM  Result Value Ref Range Status   Specimen Description BLOOD BLOOD RIGHT ARM  Final   Special Requests BOTTLES DRAWN AEROBIC ONLY 10CC  Final   Culture   Final    NO GROWTH 2 DAYS Performed at [facility]    Report Status PENDING  Incomplete  MRSA PCR Screening     Status: Abnormal   Collection Time: 10/12/15  6:53 PM  Result Value Ref Range Status   MRSA by PCR POSITIVE (A) NEGATIVE Final    Comment:        The GeneXpert MRSA Assay (FDA approved for NASAL specimens only), is one component of a comprehensive MRSA  colonization surveillance program. It is not intended to diagnose MRSA infection nor to guide or monitor treatment for MRSA infections. RESULT CALLED TO, READ BACK BY AND VERIFIED WITH: A.SAISON,RN AT 2153 ON 10/12/15 BY W.SHEA   Culture, blood (routine x 2)     Status: None (Preliminary result)   Collection Time: 10/12/15  6:58 PM  Result Value Ref Range Status   Specimen Description BLOOD BLOOD LEFT ARM  Final   Special Requests BOTTLES DRAWN AEROBIC ONLY 10CC  Final   Culture   Final    NO GROWTH 2 DAYS Performed at [facility]    Report Status PENDING  Incomplete  C difficile quick scan w PCR reflex     Status: None   Collection Time: 10/13/15  3:30 PM  Result Value Ref Range Status   C Diff antigen NEGATIVE NEGATIVE Final   C Diff toxin NEGATIVE NEGATIVE Final   C Diff interpretation Negative for toxigenic C. difficile  Final     Studies: No results found.  Scheduled Meds: . albumin human  50 g Intravenous Once  . antiseptic oral rinse  7 mL Mouth Rinse q12n4p  . chlorhexidine  15 mL Mouth Rinse BID  . Chlorhexidine Gluconate Cloth  6 each Topical Q0600  . feeding supplement (ENSURE ENLIVE)  237 mL Oral TID BM  . fluconazole  100 mg Oral Daily  . megestrol  400 mg Oral Daily  . midodrine  10 mg Oral TID WC  . mupirocin ointment  1 application Nasal BID  . sodium bicarbonate  650 mg Oral TID  . sodium chloride  3 mL Intravenous Q12H  . sodium chloride  1 g Oral TID WC    Continuous Infusions: . sodium chloride 150 mL/hr at 10/14/15 0001     Time spent: 71mins  Beyonca Wisz MD, PhD  [facility] Pager [phone]. If 7PM-7AM, please contact night-coverage at [email or web address], password [facility] 10/31/2015, 8:16 AM  LOS: 4 days

## 2015-10-15 NOTE — Progress Notes (Signed)
Patient was placed on an Amiodarone gtt, with a loading bolus dose from the bag.  This was mistaken for a separate infusion/bag, therefore a different bag of Amiodarone with a different dosage was overridden and removed from the pyxis per Gae Gallop, RN.  The bag was taken into the patient's room and taken out of the box, but was not used, due to seeing the correct order.  The bag was placed in the black box in the medication room to be disposed of.

## 2015-10-15 NOTE — Progress Notes (Signed)
Chaplain paged at [place]  Parents of Mr Sircy requested that the chaplain call [facility] and request an immediate emergency visit by Father Lucile Shutters. A call was made to the church and an emergency message was left for Father Lucile Shutters to come and provide the [place]. That message was completed at or about 2020.  Parents did not wish any further spiritual support or care from the on-call chaplain.  Chaplain follow-up during regular weekday hours is recommended.  Sallee Lange. Jakerria Kingbird, DMin, MDiv Chaplain

## 2015-10-16 LAB — PREPARE FRESH FROZEN PLASMA
UNIT DIVISION: 0
UNIT DIVISION: 0

## 2015-10-16 LAB — COMPREHENSIVE METABOLIC PANEL
ALBUMIN: 2.8 g/dL — AB (ref 3.5–5.0)
ALK PHOS: 45 U/L (ref 38–126)
ALT: 16 U/L — ABNORMAL LOW (ref 17–63)
ANION GAP: 10 (ref 5–15)
AST: 30 U/L (ref 15–41)
BUN: 99 mg/dL — AB (ref 6–20)
CALCIUM: 9.9 mg/dL (ref 8.9–10.3)
CO2: 17 mmol/L — ABNORMAL LOW (ref 22–32)
Chloride: 112 mmol/L — ABNORMAL HIGH (ref 101–111)
Creatinine, Ser: 2.84 mg/dL — ABNORMAL HIGH (ref 0.61–1.24)
GFR calc Af Amer: 27 mL/min — ABNORMAL LOW (ref >60)
GFR, EST NON AFRICAN AMERICAN: 23 mL/min — AB (ref >60)
GLUCOSE: 105 mg/dL — AB (ref 65–99)
POTASSIUM: 4.8 mmol/L (ref 3.5–5.1)
Sodium: 139 mmol/L (ref 135–145)
TOTAL PROTEIN: 5.4 g/dL — AB (ref 6.5–8.1)
Total Bilirubin: 2.9 mg/dL — ABNORMAL HIGH (ref 0.3–1.2)

## 2015-10-16 LAB — TYPE AND SCREEN
ABO/RH(D): B POS
Antibody Screen: NEGATIVE
Unit division: 0

## 2015-10-16 LAB — GRAM STAIN

## 2015-10-16 LAB — CBC
HCT: 20.9 % — ABNORMAL LOW (ref 39.0–52.0)
Hemoglobin: 7.5 g/dL — ABNORMAL LOW (ref 13.0–17.0)
MCH: 35.9 pg — AB (ref 26.0–34.0)
MCHC: 35.9 g/dL (ref 30.0–36.0)
MCV: 100 fL (ref 78.0–100.0)
PLATELETS: 85 10*3/uL — AB (ref 150–400)
RBC: 2.09 MIL/uL — AB (ref 4.22–5.81)
RDW: 16.2 % — AB (ref 11.5–15.5)
WBC: 13.7 10*3/uL — ABNORMAL HIGH (ref 4.0–10.5)

## 2015-10-16 LAB — VANCOMYCIN, RANDOM: VANCOMYCIN RM: 23 ug/mL

## 2015-10-16 LAB — AMMONIA: Ammonia: 106 umol/L — ABNORMAL HIGH (ref 9–35)

## 2015-10-16 MED ORDER — LIP MEDEX EX OINT
TOPICAL_OINTMENT | CUTANEOUS | Status: AC
  Administered 2015-10-16: 1
  Filled 2015-10-16: qty 7

## 2015-10-16 MED ORDER — SODIUM CHLORIDE 0.9 % IV SOLN
10.0000 mg/h | INTRAVENOUS | Status: DC
  Administered 2015-10-16: 10 mg/h via INTRAVENOUS
  Filled 2015-10-16: qty 10

## 2015-10-16 NOTE — Progress Notes (Signed)
   10/16/15 0900  Clinical Encounter Type  Visited With Family;Other (Comment) (Patient was not awake)  Visit Type Initial;Follow-up;Psychological support;Spiritual support;Critical Care  Referral From Nurse;Chaplain  Consult/Referral To Chaplain  Spiritual Encounters  Spiritual Needs Other (Comment) (None at the moment)  Stress Factors  Patient Stress Factors Not reviewed  Family Stress Factors Health changes   I went to visit with the patient and his family upon referral from the night Chaplain who stated that he was referred to the patient's family the previous night. As I entered the room, there were 4 people present at the bedside and lights were off. I asked if this was a good time to visit; which a woman stated that 2 of the visitors had just arrived from [place]. She requested that I come back at a later time. I will follow-up at a later time.   Formoso M.Div.

## 2015-10-16 NOTE — Progress Notes (Signed)
[facility]                                                                              Patient Demographics  Eric Schroeder, is a 60 y.o. male, DOB - 02/28/1956, VC:[id number]  Admit date - 10/03/2015   Admitting Physician Florencia Reasons, MD  Outpatient Primary MD for the patient is No PCP Per Patient  LOS - 5   No chief complaint on file.     HPI on 10/04/2015 by Dr. Florencia Reasons Eric Schroeder is a 60 y.o. 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 fib with RVR, hypotension. He was placed on digoxin, converted to sinus rhythm.  Patient then developed hematemesis. [facility] him as well as gastroenterology were consulted and appreciated. Patient was made DO NOT RESUSCITATE and was found to be in multiorgan failure and would not be a candidate for EGD. Comfort care was discussed with the family, patient currently comfort care  Assessment & Plan   Hematemesis/esophageal variceal bleed acute blood loss anemia -Hemoglobin currently 7.5 -Gastroenterology consulted and appreciated, did not feel patient be a good candidate for EGD given his multiorgan failure -PCCM also consulted and felt patient would not be a good candidate for further treatment, spoke with the patient's family regarding comfort care -Made patient comfort care this morning at the request of his 2 daughters and call for an eye bedside -Continue octreotide, morphine, Ativan  Paroxysmal atrial fibrillation -Currently in sinus rhythm  Acute renal failure/Hypotension -Likely multifactorial including high biliary drain output, infection, paracentesis, hepatorenal syndrome? -Was placed on midodrine, given albumin and  IV fluids with sodium bicarbonate -Creatinine continues to worsen, Cr 2.84 this morning  Lactic acidosis -In the setting of infection, acute renal failure, hepatic dysfunction  Dehydration/hyponatremia, increased biliary drain -Patient did have paracentesis -Please hospitalist spoke with interventional radiology regarding possible tapping of the drain as patient has had 4 L out in about 24 hours  Ascites/SBP -Patient status post paracentesis on 10/12/2015 -Fluid studies showed elevated neutrophils greater than 500, fluid culture positive for MRSA -Patient was placed on vancomycin and Rocephin, Diflucan was started as patient had clinical deterioration (previous studies on 12/6 and 12/8 showed budding yeast, Candida albicans in the peritoneal fluid)  Cirrhosis/cholangiocarcinoma/elevated LFTs and INR -Oncology consulted and appreciated, patient has overall poor prognosis  Diarrhea -C. difficile was negative on 10/13/2015  Severe malnutrition -Patient was placed on Megace along with nutritional supplements  Goals of care -Patient was made DO NOT RESUSCITATE on 10/22/2015. -Discussed patient's case with his girlfriend, daughters at bedside. Decision was made to move to full comfort care. -Will place patient on morphine drip to titrate up to 10 mg/h as needed for comfort as well as Ativan. -Asked daughters if they would like to speak to the chaplain, one daughter stated "what a joke." -Asked family and they would like palliative care consulted, declined  Code Status: DO  NOT RESUSCITATE/comfort care  Family Communication: Family at Bedside  Disposition Plan: Admitted, will transfer to medical floor for comfort care  Time Spent in minutes   30 minutes  Procedures  Ultrasound-guided paracentesis on 10/12/2015  Consults   Gastroenterology PCCM Interventional radiology Oncology  DVT Prophylaxis  SCDs  Lab Results  Component Value Date   PLT 85* 10/16/2015     Medications  Scheduled Meds: . antiseptic oral rinse  7 mL Mouth Rinse q12n4p  . chlorhexidine  15 mL Mouth Rinse BID  . Chlorhexidine Gluconate Cloth  6 each Topical Q0600  . feeding supplement (ENSURE ENLIVE)  237 mL Oral TID BM  . mupirocin ointment  1 application Nasal BID  . sodium chloride  3 mL Intravenous Q12H  . sodium chloride  1 g Oral TID WC   Continuous Infusions: . sodium chloride 150 mL/hr at 10/16/15 0750  . morphine 10 mg/hr (10/16/15 0859)  . octreotide  (SANDOSTATIN)    IV infusion 50 mcg/hr (10/16/15 1108)   PRN Meds:.acetaminophen, diphenhydrAMINE, LORazepam, ondansetron (ZOFRAN) IV  Antibiotics    Anti-infectives    Start     Dose/Rate Route Frequency Ordered Stop   10/13/15 1800  vancomycin (VANCOCIN) IVPB 750 mg/150 ml premix  Status:  Discontinued     750 mg 150 mL/hr over 60 Minutes Intravenous Every 12 hours 10/13/15 1113 10/14/15 1946   10/13/15 1800  fluconazole (DIFLUCAN) tablet 100 mg  Status:  Discontinued     100 mg Oral Daily 10/13/15 1705 10/16/15 0814   10/13/15 0600  vancomycin (VANCOCIN) IVPB 1000 mg/200 mL premix  Status:  Discontinued     1,000 mg 200 mL/hr over 60 Minutes Intravenous Every 12 hours 10/12/15 1809 10/13/15 1113   10/12/15 1815  vancomycin (VANCOCIN) 1,500 mg in sodium chloride 0.9 % 500 mL IVPB     1,500 mg 250 mL/hr over 120 Minutes Intravenous  Once 10/12/15 1809 10/12/15 2043   10/12/15 1700  cefTRIAXone (ROCEPHIN) 2 g in dextrose 5 % 50 mL IVPB  Status:  Discontinued     2 g 100 mL/hr over 30 Minutes Intravenous Every 24 hours 10/12/15 1637 10/14/15 1100      Subjective:   [employer] seen and examined today.  Unresponsive.    Objective:   Filed Vitals:   10/16/15 0500 10/16/15 0600 10/16/15 0700 10/16/15 0800  BP: 97/45 107/49 101/52 105/51  Pulse: 100 106 106   Temp:    98.8 F (37.1 C)  TempSrc:    Axillary  Resp: 19 19 19 19   Height:      Weight:      SpO2: 98% 98% 97% 98%    Wt Readings  from Last 3 Encounters:  10/16/15 85.5 kg (188 lb 7.9 oz)  09/23/2015 81.285 kg (179 lb 3.2 oz)  10/08/15 82.373 kg (181 lb 9.6 oz)     Intake/Output Summary (Last 24 hours) at 10/16/15 1114 Last data filed at 10/16/15 1000  Gross per 24 hour  Intake 4359.08 ml  Output   1825 ml  Net 2534.08 ml    Exam  General: Well developed, chronically ill, malnourished  Cardiovascular: S1 S2 auscultated, RRR  Respiratory: scattered rhonchi  Abdomen: Soft, nontender, distended, + bowel sounds  Data Review   Micro Results Recent Results (from the past 240 hour(s))  Culture, body fluid-bottle     Status: None   Collection Time: 10/12/15  3:55 PM  Result Value Ref Range Status   Specimen Description PERITONEAL  Final   Special Requests BOTTLES DRAWN AEROBIC AND ANAEROBIC 44ml  Final   Gram Stain   Final    GRAM POSITIVE COCCI IN CLUSTERS IN BOTH AEROBIC AND ANAEROBIC BOTTLES CRITICAL RESULT CALLED TO, READ BACK BY AND VERIFIED WITH: TO JCLARK(RN) 10/13/15 BY TCLEVELAND 10/13/15 3:30AM    Culture   Final    METHICILLIN RESISTANT STAPHYLOCOCCUS AUREUS Performed at [facility]    Report Status 10/22/2015 FINAL  Final   Organism ID, Bacteria METHICILLIN RESISTANT STAPHYLOCOCCUS AUREUS  Final      Susceptibility   Methicillin resistant staphylococcus aureus - MIC*    CIPROFLOXACIN >=8 RESISTANT Resistant     ERYTHROMYCIN >=8 RESISTANT Resistant     GENTAMICIN <=0.5 SENSITIVE Sensitive     OXACILLIN >=4 RESISTANT Resistant     TETRACYCLINE <=1 SENSITIVE Sensitive     VANCOMYCIN <=0.5 SENSITIVE Sensitive     TRIMETH/SULFA <=10 SENSITIVE Sensitive     CLINDAMYCIN <=0.25 SENSITIVE Sensitive     RIFAMPIN <=0.5 SENSITIVE Sensitive     Inducible Clindamycin NEGATIVE Sensitive     * METHICILLIN RESISTANT STAPHYLOCOCCUS AUREUS  Gram stain     Status: None   Collection Time: 10/12/15  3:55 PM  Result Value Ref Range Status   Specimen Description PERITONEAL  Final   Special Requests  NONE  Final   Gram Stain   Final    WBC PRESENT,BOTH PMN AND MONONUCLEAR GRAM POSITIVE COCCI IN CLUSTERS CRITICAL RESULT CALLED TO, READ BACK BY AND VERIFIED WITH: R.BALDWIN,RN 10/12/15 @1744  BY V.WILKINS CONFIRMED BY K.WOOTEN Performed at [facility]    Report Status 10/16/2015 FINAL  Final  Culture, blood (routine x 2)     Status: None (Preliminary result)   Collection Time: 10/12/15  6:50 PM  Result Value Ref Range Status   Specimen Description BLOOD BLOOD RIGHT ARM  Final   Special Requests BOTTLES DRAWN AEROBIC ONLY 10CC  Final   Culture   Final    NO GROWTH 3 DAYS Performed at [facility]    Report Status PENDING  Incomplete  MRSA PCR Screening     Status: Abnormal   Collection Time: 10/12/15  6:53 PM  Result Value Ref Range Status   MRSA by PCR POSITIVE (A) NEGATIVE Final    Comment:        The GeneXpert MRSA Assay (FDA approved for NASAL specimens only), is one component of a comprehensive MRSA colonization surveillance program. It is not intended to diagnose MRSA infection nor to guide or monitor treatment for MRSA infections. RESULT CALLED TO, READ BACK BY AND VERIFIED WITH: A.SAISON,RN AT 2153 ON 10/12/15 BY W.SHEA   Culture, blood (routine x 2)     Status: None (Preliminary result)   Collection Time: 10/12/15  6:58 PM  Result Value Ref Range Status   Specimen Description BLOOD BLOOD LEFT ARM  Final   Special Requests BOTTLES DRAWN AEROBIC ONLY 10CC  Final   Culture   Final    NO GROWTH 3 DAYS Performed at [facility]    Report Status PENDING  Incomplete  C difficile quick scan w PCR reflex     Status: None   Collection Time: 10/13/15  3:30 PM  Result Value Ref Range Status   C Diff antigen NEGATIVE NEGATIVE Final   C Diff toxin NEGATIVE NEGATIVE Final   C Diff interpretation Negative for toxigenic C. difficile  Final  Culture, body fluid-bottle     Status: None (Preliminary result)  Collection Time: 10/22/2015 12:02 PM  Result  Value Ref Range Status   Specimen Description FLUID BILE  Final   Special Requests BOTTLES DRAWN AEROBIC AND ANAEROBIC 10CC  Final   Gram Stain   Final    GRAM POSITIVE COCCI IN CLUSTERS IN BOTH AEROBIC AND ANAEROBIC BOTTLES CRITICAL RESULT CALLED TO, READ BACK BY AND VERIFIED WITH: C AUMAN @0146  10/15/14 MKELLY Performed at [facility]    Culture PENDING  Incomplete   Report Status PENDING  Incomplete  Gram stain     Status: None   Collection Time: 11/12/2015 12:02 PM  Result Value Ref Range Status   Specimen Description FLUID BILE  Final   Special Requests NONE  Final   Gram Stain   Final    RARE WBC PRESENT, PREDOMINANTLY PMN RARE GRAM VARIABLE ROD Performed at [facility]    Report Status 10/16/2015 FINAL  Final    Radiology Reports Dg Lumbar Spine Complete  09/27/2015  CLINICAL DATA:  Recent fall with low back pain, initial encounter EXAM: LUMBAR SPINE - COMPLETE 4+ VIEW COMPARISON:  None. FINDINGS: Biliary drainage catheter is noted. Five lumbar type vertebral bodies are well visualized. Mild osteophytic changes are seen. No spondylolisthesis is noted. No compression deformity is noted. No gross soft tissue abnormality is seen. IMPRESSION: Mild degenerative change without acute abnormality. Electronically Signed   By: Inez Catalina M.D.   On: 09/27/2015 14:16   Ct Head Wo Contrast  09/27/2015  CLINICAL DATA:  Balance disorder ; right foot drop. Fall. History of cholangiocarcinoma. EXAM: CT HEAD WITHOUT CONTRAST TECHNIQUE: Contiguous axial images were obtained from the base of the skull through the vertex without intravenous contrast. COMPARISON:  None. FINDINGS: There is mild diffuse atrophy. There is mild invagination of CSF into the sella. There is no intracranial mass, hemorrhage, extra-axial fluid collection, or midline shift. Gray-white compartments are normal. No acute infarct evident. The bony calvarium appears intact. The mastoid air cells are clear. No  intraorbital lesions are identified. IMPRESSION: Mild atrophy. Mild degree of empty sella. No intracranial mass, hemorrhage, or extra-axial fluid collection. No focal gray-white compartment lesions are identified. Electronically Signed   By: Lowella Grip III M.D.   On: 09/27/2015 14:17   US Renal  09/28/2015  CLINICAL DATA:  60 year old male with acute renal failure. EXAM: RENAL / URINARY TRACT ULTRASOUND COMPLETE COMPARISON:  None. FINDINGS: Right Kidney: Length: 11.0 cm. Echogenicity within normal limits. No mass or hydronephrosis visualized. Left Kidney: Length: 12.0 cm. Echogenicity within normal limits. No mass or hydronephrosis visualized. Bladder: Appears normal for degree of bladder distention. IMPRESSION: Unremarkable renal ultrasound. Electronically Signed   By: Margarette Canada M.D.   On: 09/28/2015 12:09   US Paracentesis  10/12/2015  INDICATION: ascites cholangiocarcinoma EXAM: ULTRASOUND-GUIDED PARACENTESIS COMPARISON:  Previous paracentesis MEDICATIONS: 10 cc 1% lidocaine COMPLICATIONS: None immediate TECHNIQUE: Informed written consent was obtained from the patient after a discussion of the risks, benefits and alternatives to treatment. A timeout was performed prior to the initiation of the procedure. Initial ultrasound scanning demonstrates a large amount of ascites within the right lower abdominal quadrant. The right lower abdomen was prepped and draped in the usual sterile fashion. 1% lidocaine with epinephrine was used for local anesthesia. Under direct ultrasound guidance, a 19 gauge, 7-cm, Yueh catheter was introduced. An ultrasound image was saved for documentation purposed. The paracentesis was performed. The catheter was removed and a dressing was applied. The patient tolerated the procedure well without immediate post  procedural complication. FINDINGS: A total of approximately 4.2 liters of yellow fluid was removed. Samples were sent to the laboratory as requested by the clinical  team. IMPRESSION: Successful ultrasound-guided paracentesis yielding 4.2 liters of peritoneal fluid. Read By:  Lavonia Drafts [facility] Electronically Signed   By: Marybelle Killings M.D.   On: 10/12/2015 12:52   Ir Fluoro Guide Ndl Plmt / Bx  09/20/2015  CLINICAL DATA:  60 year old male with a history of hyperbilirubinemia and biliary obstruction. 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 The patient has undergone a left-sided percutaneous transhepatic cholangiogram and drainage performed 09/16/2015. He has not had normalization of his hyperbilirubinemia. He presents today for a through the tube cholangiogram and a biopsy EXAM: FLUOROSCOPIC GUIDED FINE NEEDLE ASPIRATION OF LIVER TUMOR/BILIARY TUMOR THROUGH THE TUBE CHOLANGIOGRAM EXCHANGE OF INDWELLING LEFT-SIDED BILIARY INTERNAL EXTERNAL DRAIN FLUOROSCOPY TIME:  7 minutes 30 seconds MEDICATIONS AND MEDICAL HISTORY: None ANESTHESIA/SEDATION: 1.5 mg Versed, 75 mcg fentanyl 55 minutes CONTRAST:  72mL OMNIPAQUE IOHEXOL 300 MG/ML  SOLN COMPLICATIONS: None PROCEDURE: Informed consent was obtained from the patient following explanation of the procedure, risks, benefits and alternatives. The patient understands, agrees and consents for the procedure. All questions were addressed. A time out was performed. Maximal barrier sterile technique utilized including caps, mask, sterile gowns, sterile gloves, large sterile drape, hand hygiene, and 1% lidocaine was used for local anesthesia. The patient is positioned supine position on the IR table and the upper abdomen was prepped and draped in the usual sterile fashion. Scout images of the indwelling biliary tube were acquired and were used as a target for fine-needle aspiration of the soft tissues in the adjacent liver and biliary system. Three separate 21 gauge fine-needle Chiba were used for fine-needle aspirations under fluoroscopic guidance. The samples were passed to a cytotechnologist in  the room. Initial quick stain was negative, and 3 separate fine-needle aspirations were acquired under fluoroscopy. A Bentson wire was advanced through the indwelling tube into the duodenum, and the tube was removed. An 8 French sheath was advanced over the wire into the biliary system. The dilator was removed and a through the tube cholangiogram was performed with multiple obliquities. A new 10 French internal-external biliary tube was then placed. Final image was stored. Patient tolerated the procedure well and remained hemodynamically stable throughout. No complications were encountered and no significant blood loss was encountered. FINDINGS: Through the tube cholangiogram demonstrates opacification of the majority of the right-sided ductal system, which is in communication with the left-sided ductal system. There is distortion of the ducts at the confluence of the left and the right sided ducts, with a few ducts of segment 6 not opacified, potentially obstructed by the tumor. Significant common bile duct and common hepatic duct dilation, with a tubular filling defect nearly entirely filling the common hepatic duct and common bile duct to the level of the ampulla. Final image demonstrates placement of a new 84 [place] internal external biliary tube terminating in the duodenum, with the most proximal side hole within the left-sided ductal system. 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 Tissue specimen sent to pathology for complete histopathologic analysis. Status post through the tube cholangiogram with confirmation of communication of the majority of the right-sided hepatic ducts with the left-sided ducts which remain drained. Status post exchange of internal/ external biliary drain with placement of a new 10 French drain terminating in  the duodenum. Signed, Dulcy Fanny. Earleen Newport, DO [facility]  [facility] Radiology Electronically Signed   By: Corrie Mckusick D.O.   On: 09/20/2015 14:21   Ir Int Lianne Cure Biliary Drain With Cholangiogram  09/17/2015  CLINICAL DATA:  Biliary obstruction EXAM: IR INT-EXT BILIARY DRAIN W/ CHOLANGIOGRAM; IR BALLOON DILATION OF BILIARY DUCT/AMPULLA FLUOROSCOPY TIME:  9 minutes and 6 seconds MEDICATIONS AND MEDICAL HISTORY: Versed 4 mg, Fentanyl 100 mcg. Additional Medications: None.  Patient was on Rocephin. ANESTHESIA/SEDATION: Moderate sedation time: 60 minutes CONTRAST:  15 cc Omnipaque 300 PROCEDURE: The procedure, risks, benefits, and alternatives were explained to the patient. Questions regarding the procedure were encouraged and answered. The patient understands and consents to the procedure. The upper abdominal region was prepped with Betadine in a sterile fashion, and a sterile drape was applied covering the operative field. A sterile gown and sterile gloves were used for the procedure. Under sonographic guidance, a Chiba needle was inserted into a left hepatic duct. Contrast was injected opacifying the biliary tree. The needle was removed over a 018 wire. The Accustick transitional dilator was advanced over the wire to the mid biliary tree. It could not be advanced into the central biliary tree secondary to a very resistant partial obstruction. An Amplatz wire was advanced through the transitional dilator. This was advanced into the central biliary tree. Again, the 6 [place] transitional dilator of the Accustick set could not be advanced across the Amplatz wire. A 4 French glide catheter was advanced across the Amplatz wire into the central biliary tree. The glide catheter was advanced over a Bentson wire into the duodenum then was again exchange for the Amplatz wire. Initial attempts at dilating across the partial obstruction with serial dilators starting from 5 [place] were unsuccessful. A 5 French sheath was advanced over the Amplatz into the peripheral biliary tree a 4  French balloon was utilized to dilate the partial obstruction. The tract was then easily dilated to 8 [place] an 8 [place] biliary drain was then advanced over the Amplatz into the duodenum. Two additional sideholes were cut above the upper marker. It was looped and string fixed in the duodenum then sewn to the skin. The initial aspirate was sent for a cytology analysis. Contrast was injected. FINDINGS: Percutaneous cholangiography into the left the biliary tree demonstrates very limited opacification the the bile ducts. This was performed in an attempt to prevent sepsis. Final images demonstrate left internal external biliary drain placement with its tip coiled in the duodenum. Central left-sided ducts only were opacified by contrast. COMPLICATIONS: None IMPRESSION: Successful left internal external biliary drain placement. An 8 French device was placed secondary to a resistant biliary obstruction. Initial bile aspirate was sent for cytology. Electronically Signed   By: Marybelle Killings M.D.   On: 09/17/2015 08:33   Ir Exchange Biliary Drain  09/20/2015  CLINICAL DATA:  60 year old male with a history of hyperbilirubinemia and biliary obstruction. 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 The patient has undergone a left-sided percutaneous transhepatic cholangiogram and drainage performed 09/16/2015. He has not had normalization of his hyperbilirubinemia. He presents today for a through the tube cholangiogram and a biopsy EXAM: FLUOROSCOPIC GUIDED FINE NEEDLE ASPIRATION OF LIVER TUMOR/BILIARY TUMOR THROUGH THE TUBE CHOLANGIOGRAM EXCHANGE OF INDWELLING LEFT-SIDED BILIARY INTERNAL EXTERNAL DRAIN FLUOROSCOPY TIME:  7 minutes 30 seconds MEDICATIONS AND MEDICAL HISTORY: None ANESTHESIA/SEDATION: 1.5 mg Versed, 75 mcg fentanyl 55 minutes CONTRAST:  59mL OMNIPAQUE  IOHEXOL 300 MG/ML  SOLN COMPLICATIONS: None PROCEDURE: Informed consent was obtained from the patient following  explanation of the procedure, risks, benefits and alternatives. The patient understands, agrees and consents for the procedure. All questions were addressed. A time out was performed. Maximal barrier sterile technique utilized including caps, mask, sterile gowns, sterile gloves, large sterile drape, hand hygiene, and 1% lidocaine was used for local anesthesia. The patient is positioned supine position on the IR table and the upper abdomen was prepped and draped in the usual sterile fashion. Scout images of the indwelling biliary tube were acquired and were used as a target for fine-needle aspiration of the soft tissues in the adjacent liver and biliary system. Three separate 21 gauge fine-needle Chiba were used for fine-needle aspirations under fluoroscopic guidance. The samples were passed to a cytotechnologist in the room. Initial quick stain was negative, and 3 separate fine-needle aspirations were acquired under fluoroscopy. A Bentson wire was advanced through the indwelling tube into the duodenum, and the tube was removed. An 8 French sheath was advanced over the wire into the biliary system. The dilator was removed and a through the tube cholangiogram was performed with multiple obliquities. A new 10 French internal-external biliary tube was then placed. Final image was stored. Patient tolerated the procedure well and remained hemodynamically stable throughout. No complications were encountered and no significant blood loss was encountered. FINDINGS: Through the tube cholangiogram demonstrates opacification of the majority of the right-sided ductal system, which is in communication with the left-sided ductal system. There is distortion of the ducts at the confluence of the left and the right sided ducts, with a few ducts of segment 6 not opacified, potentially obstructed by the tumor. Significant common bile duct and common hepatic duct dilation, with a tubular filling defect nearly entirely filling the common  hepatic duct and common bile duct to the level of the ampulla. Final image demonstrates placement of a new 28 [place] internal external biliary tube terminating in the duodenum, with the most proximal side hole within the left-sided ductal system. 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 Tissue specimen sent to pathology for complete histopathologic analysis. Status post through the tube cholangiogram with confirmation of communication of the majority of the right-sided hepatic ducts with the left-sided ducts which remain drained. Status post exchange of internal/ external biliary drain with placement of a new 10 French drain terminating in the duodenum. Signed, Dulcy Fanny. Earleen Newport, DO [facility] Radiology Electronically Signed   By: Corrie Mckusick D.O.   On: 09/20/2015 14:21   Ir Balloon Dilation Of Biliary Ducts/ampulla  09/17/2015  CLINICAL DATA:  Biliary obstruction EXAM: IR INT-EXT BILIARY DRAIN W/ CHOLANGIOGRAM; IR BALLOON DILATION OF BILIARY DUCT/AMPULLA FLUOROSCOPY TIME:  9 minutes and 6 seconds MEDICATIONS AND MEDICAL HISTORY: Versed 4 mg, Fentanyl 100 mcg. Additional Medications: None.  Patient was on Rocephin. ANESTHESIA/SEDATION: Moderate sedation time: 60 minutes CONTRAST:  15 cc Omnipaque 300 PROCEDURE: The procedure, risks, benefits, and alternatives were explained to the patient. Questions regarding the procedure were encouraged and answered. The patient understands and consents to the procedure. The upper abdominal region was prepped with Betadine in a sterile fashion, and a sterile drape was applied covering the operative field. A sterile gown and sterile gloves were used for the procedure. Under sonographic guidance, a Chiba needle was inserted into a left hepatic duct. Contrast was injected opacifying the biliary tree. The needle  was removed over a 018 wire. The Accustick  transitional dilator was advanced over the wire to the mid biliary tree. It could not be advanced into the central biliary tree secondary to a very resistant partial obstruction. An Amplatz wire was advanced through the transitional dilator. This was advanced into the central biliary tree. Again, the 6 [place] transitional dilator of the Accustick set could not be advanced across the Amplatz wire. A 4 French glide catheter was advanced across the Amplatz wire into the central biliary tree. The glide catheter was advanced over a Bentson wire into the duodenum then was again exchange for the Amplatz wire. Initial attempts at dilating across the partial obstruction with serial dilators starting from 5 [place] were unsuccessful. A 5 French sheath was advanced over the Amplatz into the peripheral biliary tree a 4 French balloon was utilized to dilate the partial obstruction. The tract was then easily dilated to 8 [place] an 8 [place] biliary drain was then advanced over the Amplatz into the duodenum. Two additional sideholes were cut above the upper marker. It was looped and string fixed in the duodenum then sewn to the skin. The initial aspirate was sent for a cytology analysis. Contrast was injected. FINDINGS: Percutaneous cholangiography into the left the biliary tree demonstrates very limited opacification the the bile ducts. This was performed in an attempt to prevent sepsis. Final images demonstrate left internal external biliary drain placement with its tip coiled in the duodenum. Central left-sided ducts only were opacified by contrast. COMPLICATIONS: None IMPRESSION: Successful left internal external biliary drain placement. An 8 French device was placed secondary to a resistant biliary obstruction. Initial bile aspirate was sent for cytology. Electronically Signed   By: Marybelle Killings M.D.   On: 09/17/2015 08:33    CBC  Recent Labs Lab 09/29/2015 1325  10/13/15 0545 10/13/15 1720 10/14/15 0401 10/14/15 0428  10/21/2015 0530 10/16/15 0310  WBC 10.2  < > 10.2 11.9* 8.2  --  12.8* 13.7*  HGB 13.4  < > 12.0* 11.4* 9.3*  --  10.4* 7.5*  HCT 40.2  < > 33.7* 32.0* 26.8* 25.5* 29.6* 20.9*  PLT 101*  < > 87* 82* 70*  --  107* 85*  MCV 107.5*  < > 100.6* 100.3* 100.8*  --  102.4* 100.0  MCH 35.9*  < > 35.8* 35.7* 35.0*  --  36.0* 35.9*  MCHC 33.4  < > 35.6 35.6 34.7  --  35.1 35.9  RDW 14.1  < > 13.7 13.7 13.9  --  14.2 16.2*  LYMPHSABS 0.6*  --   --   --  0.9  --   --   --   MONOABS 0.8  --   --   --  0.8  --   --   --   EOSABS 0.2  --   --   --  0.6  --   --   --   BASOSABS 0.1  --   --   --  0.2*  --   --   --   < > = values in this interval not displayed.  Chemistries   Recent Labs Lab 10/13/15 0545 10/13/15 1720 10/14/15 0401 10/14/15 1820 10/13/2015 0530 10/16/15 0310  NA 127* 128* 133* 133* 132* 139  K 4.4 4.1 4.2 4.4 4.2 4.8  CL 96* 99* 104 103 102 112*  CO2 22 22 20* 19* 18* 17*  GLUCOSE 117* 113* 104* 84 154* 105*  BUN 56* 62* 61* 77* 79*  99*  CREATININE 1.95* 2.27* 2.35* 2.55* 2.66* 2.84*  CALCIUM 9.1 8.7* 9.7 10.1 9.8 9.9  MG 1.9  --   --   --   --   --   AST 33 37 28  --  28 30  ALT 24 24 17   --  16* 16*  ALKPHOS 94 90 61  --  58 45  BILITOT 1.9* 2.0* 1.7*  --  2.2* 2.9*   ------------------------------------------------------------------------------------------------------------------ estimated creatinine clearance is 30.7 mL/min (by C-G formula based on Cr of 2.84). ------------------------------------------------------------------------------------------------------------------ No results for input(s): HGBA1C in the last 72 hours. ------------------------------------------------------------------------------------------------------------------ No results for input(s): CHOL, HDL, LDLCALC, TRIG, CHOLHDL, LDLDIRECT in the last 72 hours. ------------------------------------------------------------------------------------------------------------------ No results for input(s):  TSH, T4TOTAL, T3FREE, THYROIDAB in the last 72 hours.  Invalid input(s): FREET3 ------------------------------------------------------------------------------------------------------------------  Recent Labs  10/14/15 0428  VITAMINB12 1159*    Coagulation profile  Recent Labs Lab 10/14/15 0401  INR 2.39*    No results for input(s): DDIMER in the last 72 hours.  Cardiac Enzymes No results for input(s): CKMB, TROPONINI, MYOGLOBIN in the last 168 hours.  Invalid input(s): CK ------------------------------------------------------------------------------------------------------------------ Invalid input(s): POCBNP    Anacaren Kohan D.O. on 10/16/2015 at 11:14 AM  Between 7am to 7pm - Pager - [phone]  After 7pm go to [email or web address] - password [email or web address]  And look for the night coverage person covering for me after hours  [facility] Office  [phone]

## 2015-10-16 NOTE — Clinical Documentation Improvement (Signed)
Hospitalist  Can the diagnosis of Protein Calorie Malnutrition be further specified?    Document Severity - Severe(third degree), Moderate (second degree), Mild (first degree)  Other condition  Unable to clinically determine  Document any associated diagnoses/conditions   Supporting Information: :     Severe malnutrition in context of chronic illness as evidenced by severe depletion of body fat, severe depletion of muscle mass per 10/14/15 RD's evaluation.     Please exercise your independent, professional judgment when responding. A specific answer is not anticipated or expected.   Thank You, [place] [phone]

## 2015-10-16 NOTE — Progress Notes (Signed)
PHARMACIST - PHYSICIAN COMMUNICATION CONCERNING:  Vancomycin  60 yoM on D5 Vancomycin for MRSA peritonitis.  SCr has been rising and vancomycin has been on hold for worsening renal function.   Vancomycin level checked this morning = 23 mcg/ml, slightly supratherapeutic (goal 15-20 mcg/ml).    RECOMMENDATION: Continue to hold vancomycin F/u BMET and repeat vancomycin level if warranted  Peggyann Juba, PharmD, BCPS Pager: [phone]  10/16/2015, 7:19 AM

## 2015-10-17 DIAGNOSIS — E46 Unspecified protein-calorie malnutrition: Secondary | ICD-10-CM

## 2015-10-17 DIAGNOSIS — K7290 Hepatic failure, unspecified without coma: Secondary | ICD-10-CM

## 2015-10-17 LAB — CBC
HEMATOCRIT: 22 % — AB (ref 39.0–52.0)
HEMOGLOBIN: 7.5 g/dL — AB (ref 13.0–17.0)
MCH: 34.4 pg — AB (ref 26.0–34.0)
MCHC: 34.1 g/dL (ref 30.0–36.0)
MCV: 100.9 fL — AB (ref 78.0–100.0)
Platelets: 104 10*3/uL — ABNORMAL LOW (ref 150–400)
RBC: 2.18 MIL/uL — ABNORMAL LOW (ref 4.22–5.81)
RDW: 16.6 % — ABNORMAL HIGH (ref 11.5–15.5)
WBC: 15.4 10*3/uL — ABNORMAL HIGH (ref 4.0–10.5)

## 2015-10-17 LAB — CULTURE, BLOOD (ROUTINE X 2)
Culture: NO GROWTH
Culture: NO GROWTH

## 2015-10-17 LAB — VANCOMYCIN, RANDOM: Vancomycin Rm: 17 ug/mL

## 2015-10-17 LAB — PROCALCITONIN: Procalcitonin: 1.44 ng/mL

## 2015-10-17 MED ORDER — VANCOMYCIN HCL IN DEXTROSE 1-5 GM/200ML-% IV SOLN
1000.0000 mg | Freq: Once | INTRAVENOUS | Status: DC
  Filled 2015-10-17: qty 200

## 2015-10-17 MED ORDER — GLYCOPYRROLATE 0.2 MG/ML IJ SOLN
0.1000 mg | Freq: Once | INTRAMUSCULAR | Status: DC
  Filled 2015-10-17: qty 0.5

## 2015-10-18 ENCOUNTER — Ambulatory Visit: Payer: Self-pay

## 2015-10-18 ENCOUNTER — Ambulatory Visit: Payer: Self-pay | Admitting: Hematology

## 2015-10-18 ENCOUNTER — Other Ambulatory Visit: Payer: Self-pay

## 2015-10-18 LAB — CULTURE, BODY FLUID W GRAM STAIN -BOTTLE

## 2015-10-18 LAB — CULTURE, BODY FLUID-BOTTLE

## 2015-10-25 ENCOUNTER — Ambulatory Visit: Payer: Self-pay

## 2015-11-04 ENCOUNTER — Other Ambulatory Visit (HOSPITAL_COMMUNITY): Payer: Self-pay

## 2015-11-08 ENCOUNTER — Ambulatory Visit: Payer: Self-pay

## 2015-11-13 NOTE — Progress Notes (Signed)
Patient expired at 20 with family at bedside.  MD was notified and pronounced death.  All IV's were stopped and [facility] was called.

## 2015-11-13 NOTE — Progress Notes (Signed)
Pharmacy Antibiotic Follow-up Note  Eric Schroeder is a 60 y.o. year-old male admitted on 09/21/2015.  The patient is currently on day #6 of  Vancomycin for MRSA peritonitis. 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  His random level this morning was 35mcg/ml. This gives est half-life of 50h. Last dose of vancomcyin was 750mg  on 10/14/15 at 05:27a.    Give vancomycin 1gm IV x1 to achieve vanco peak of ~33mcg/ml today  Recheck trough in 48hs (1/7 am)  Temp (24hrs), Avg:98.8 F (37.1 C), Min:98.2 F (36.8 C), Max:99.2 F (37.3 C)   Recent Labs Lab 10/13/15 1720 10/14/15 0401 11/11/2015 0530 10/16/15 0310 Oct 30, 2015 0450  WBC 11.9* 8.2 12.8* 13.7* 15.4*    Recent Labs Lab 10/13/15 1720 10/14/15 0401 10/14/15 1820 10/16/2015 0530 10/16/15 0310  CREATININE 2.27* 2.35* 2.55* 2.66* 2.84*   Estimated Creatinine Clearance: 30.7 mL/min (by C-G formula based on Cr of 2.84).    No Known Allergies  Antimicrobials this admission: 12/31 >>ceftriaxone >> 1/2 12/31 >> vanc >>  1/1 >> fluconazole >>  Levels/dose changes this admission: 1/1 reduce vanc 1g q12h to 750mg  q12h for increasing SCr 1/2 VT at 17:30 = 34, dc'd order (last dose vanco 750mg  1/2 at 05:27) 1/4 VRm at 0500 = 23, recheck level in AM 1/5 VRm at 0500 = 17  Microbiology results: 12/31 peritoneal fluid (aerobic & anaerobic): MRSA (hx candida 12/6 and 12/8) 12/31 MRSA PCR: POSITIVE 12/31 blood: ngtd 1/1 C.diff: Ag neg, toxin neg  Thank you for allowing pharmacy to be a part of this patient's care.  Doreene Eland, PharmD, BCPS.   Pager: [phone] October 30, 2015 8:19 AM

## 2015-11-13 NOTE — Progress Notes (Signed)
Nutrition Brief Note  Chart reviewed. Pt now transitioning to comfort care.  No further nutrition interventions warranted at this time.  Please re-consult as needed.   Lundyn Coste, RD, LDN Inpatient Clinical Dietitian Pager # [phone] After hours/weekend pager # [phone]    

## 2015-11-13 NOTE — Discharge Summary (Signed)
Physician Discharge Summary  Eric Schroeder [id number] DOB: [birth year] DOA: 10/05/2015  PCP: No PCP Per Patient  Admit date: 09/28/2015 Discharge date: 11/08/15  Time spent: 25 minutes  Patient expired on 2015-11-08 at 1:10 PM  Discharge Diagnoses:  Principal problem Decompensated cirrhosis of liver  Active Problems:   Ascites   Biliary obstruction due to malignant neoplasm   Hepatic cirrhosis (HCC)   Cholangiocarcinoma of biliary tract (HCC)   Hyponatremia   Hyperbilirubinemia   Hypoalbuminemia due to protein-calorie malnutrition (HCC)   Dehydration   Cholangiocarcinoma (HCC)   SBP (spontaneous bacterial peritonitis) ([place])   Hematemesis with nausea   Bleeding esophageal varices ([place])     Filed Weights   11/07/2015 0400 10/16/15 0424 11/08/15 1312  Weight: 84.1 kg (185 lb 6.5 oz) 85.5 kg (188 lb 7.9 oz) 85.5 kg (188 lb 7.9 oz)    History of present illness:  60 y.o. male  Who was recently diagnosed with cholangiocarcinoma s/p biliary drain is sent from oncology clinic for direct admission due to increased biliary drain, feeling dehydrated and worsening of ascites associated with abdominal discomfort. Patient was sent to step down as he developed A. fib with RVR, hypotension. He was placed on digoxin, converted to sinus rhythm. Patient then developed hematemesis. Given his worsened condition and rapid deterioration with multiorgan failure he was made DO NOT RESUSCITATE. He started becoming encephalopathic. after discussion with family patient was made full comfort.  Hospital Course:  Acute blood loss anemia secondary to bleeding esophageal varices Not a candidate for EGD given his multiorgan failure. After discussion with family patient made full comfort. Started on morphine drip and Ativan. Received octreotide as well.  Acute kidney injury with hypotension Multifactorial secondary to high biliary drain output, SBP and possible hepatorenal syndrome. Was placed briefly on  midodrine given IV albumin and IV fluids without much improvement.  Cholangiocarcinoma with decompensated cirrhosis  Patient followed by oncology. Made full comfort given his poor prognosis.  Spontaneous activity peritonitis Underwent paracentesis on 12/31. Was placed on empiric vancomycin and Rocephin.  Paroxysmal A. fib  Severe malnutrition  Patient showing progressive decline in symptoms. Was placed on morphine drip with when necessary IV Ativan. Had labored breathing since this morning. Morphine drip was titrated. All family at bedside. Patient expired 1:10 PM. Dr. Burr Medico was notified.   Procedures:  Paracentesis  Consultations:  Critical care  GI  Oncology  Discharge Exam: Filed Vitals:   November 08, 2015 0045 Nov 08, 2015 0406  BP: 116/72 120/83  Pulse: 112 117  Temp: 99 F (37.2 C) 99 F (37.2 C)  Resp: 20 20     Discharge Instructions     No Known Allergies    The results of significant diagnostics from this hospitalization (including imaging, microbiology, ancillary and laboratory) are listed below for reference.    Significant Diagnostic Studies: Dg Lumbar Spine Complete  09/27/2015  CLINICAL DATA:  Recent fall with low back pain, initial encounter EXAM: LUMBAR SPINE - COMPLETE 4+ VIEW COMPARISON:  None. FINDINGS: Biliary drainage catheter is noted. Five lumbar type vertebral bodies are well visualized. Mild osteophytic changes are seen. No spondylolisthesis is noted. No compression deformity is noted. No gross soft tissue abnormality is seen. IMPRESSION: Mild degenerative change without acute abnormality. Electronically Signed   By: Inez Catalina M.D.   On: 09/27/2015 14:16   Ct Head Wo Contrast  09/27/2015  CLINICAL DATA:  Balance disorder ; right foot drop. Fall. History of cholangiocarcinoma. EXAM: CT HEAD WITHOUT CONTRAST TECHNIQUE: Contiguous axial  images were obtained from the base of the skull through the vertex without intravenous contrast. COMPARISON:   None. FINDINGS: There is mild diffuse atrophy. There is mild invagination of CSF into the sella. There is no intracranial mass, hemorrhage, extra-axial fluid collection, or midline shift. Gray-white compartments are normal. No acute infarct evident. The bony calvarium appears intact. The mastoid air cells are clear. No intraorbital lesions are identified. IMPRESSION: Mild atrophy. Mild degree of empty sella. No intracranial mass, hemorrhage, or extra-axial fluid collection. No focal gray-white compartment lesions are identified. Electronically Signed   By: Lowella Grip III M.D.   On: 09/27/2015 14:17   US Renal  09/28/2015  CLINICAL DATA:  60 year old male with acute renal failure. EXAM: RENAL / URINARY TRACT ULTRASOUND COMPLETE COMPARISON:  None. FINDINGS: Right Kidney: Length: 11.0 cm. Echogenicity within normal limits. No mass or hydronephrosis visualized. Left Kidney: Length: 12.0 cm. Echogenicity within normal limits. No mass or hydronephrosis visualized. Bladder: Appears normal for degree of bladder distention. IMPRESSION: Unremarkable renal ultrasound. Electronically Signed   By: Margarette Canada M.D.   On: 09/28/2015 12:09   US Paracentesis  10/12/2015  INDICATION: ascites cholangiocarcinoma EXAM: ULTRASOUND-GUIDED PARACENTESIS COMPARISON:  Previous paracentesis MEDICATIONS: 10 cc 1% lidocaine COMPLICATIONS: None immediate TECHNIQUE: Informed written consent was obtained from the patient after a discussion of the risks, benefits and alternatives to treatment. A timeout was performed prior to the initiation of the procedure. Initial ultrasound scanning demonstrates a large amount of ascites within the right lower abdominal quadrant. The right lower abdomen was prepped and draped in the usual sterile fashion. 1% lidocaine with epinephrine was used for local anesthesia. Under direct ultrasound guidance, a 19 gauge, 7-cm, Yueh catheter was introduced. An ultrasound image was saved for documentation  purposed. The paracentesis was performed. The catheter was removed and a dressing was applied. The patient tolerated the procedure well without immediate post procedural complication. FINDINGS: A total of approximately 4.2 liters of yellow fluid was removed. Samples were sent to the laboratory as requested by the clinical team. IMPRESSION: Successful ultrasound-guided paracentesis yielding 4.2 liters of peritoneal fluid. Read By:  Lavonia Drafts [facility] Electronically Signed   By: Marybelle Killings M.D.   On: 10/12/2015 12:52   Ir Fluoro Guide Ndl Plmt / Bx  09/20/2015  CLINICAL DATA:  60 year old male with a history of hyperbilirubinemia and biliary obstruction. 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 The patient has undergone a left-sided percutaneous transhepatic cholangiogram and drainage performed 09/16/2015. He has not had normalization of his hyperbilirubinemia. He presents today for a through the tube cholangiogram and a biopsy EXAM: FLUOROSCOPIC GUIDED FINE NEEDLE ASPIRATION OF LIVER TUMOR/BILIARY TUMOR THROUGH THE TUBE CHOLANGIOGRAM EXCHANGE OF INDWELLING LEFT-SIDED BILIARY INTERNAL EXTERNAL DRAIN FLUOROSCOPY TIME:  7 minutes 30 seconds MEDICATIONS AND MEDICAL HISTORY: None ANESTHESIA/SEDATION: 1.5 mg Versed, 75 mcg fentanyl 55 minutes CONTRAST:  50mL OMNIPAQUE IOHEXOL 300 MG/ML  SOLN COMPLICATIONS: None PROCEDURE: Informed consent was obtained from the patient following explanation of the procedure, risks, benefits and alternatives. The patient understands, agrees and consents for the procedure. All questions were addressed. A time out was performed. Maximal barrier sterile technique utilized including caps, mask, sterile gowns, sterile gloves, large sterile drape, hand hygiene, and 1% lidocaine was used for local anesthesia. The patient is positioned supine position on the IR table and the upper abdomen was prepped and draped in the usual sterile fashion. Scout  images of the indwelling biliary  tube were acquired and were used as a target for fine-needle aspiration of the soft tissues in the adjacent liver and biliary system. Three separate 21 gauge fine-needle Chiba were used for fine-needle aspirations under fluoroscopic guidance. The samples were passed to a cytotechnologist in the room. Initial quick stain was negative, and 3 separate fine-needle aspirations were acquired under fluoroscopy. A Bentson wire was advanced through the indwelling tube into the duodenum, and the tube was removed. An 8 French sheath was advanced over the wire into the biliary system. The dilator was removed and a through the tube cholangiogram was performed with multiple obliquities. A new 10 French internal-external biliary tube was then placed. Final image was stored. Patient tolerated the procedure well and remained hemodynamically stable throughout. No complications were encountered and no significant blood loss was encountered. FINDINGS: Through the tube cholangiogram demonstrates opacification of the majority of the right-sided ductal system, which is in communication with the left-sided ductal system. There is distortion of the ducts at the confluence of the left and the right sided ducts, with a few ducts of segment 6 not opacified, potentially obstructed by the tumor. Significant common bile duct and common hepatic duct dilation, with a tubular filling defect nearly entirely filling the common hepatic duct and common bile duct to the level of the ampulla. Final image demonstrates placement of a new 65 [place] internal external biliary tube terminating in the duodenum, with the most proximal side hole within the left-sided ductal system. 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 Tissue specimen sent to pathology for complete histopathologic analysis. Status post through the tube cholangiogram with confirmation  of communication of the majority of the right-sided hepatic ducts with the left-sided ducts which remain drained. Status post exchange of internal/ external biliary drain with placement of a new 10 French drain terminating in the duodenum. Signed, Dulcy Fanny. Earleen Newport, DO [facility] Electronically Signed   By: Corrie Mckusick D.O.   On: 09/20/2015 14:21   Ir Exchange Biliary Drain  09/20/2015  CLINICAL DATA:  60 year old male with a history of hyperbilirubinemia and biliary obstruction. 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 The patient has undergone a left-sided percutaneous transhepatic cholangiogram and drainage performed 09/16/2015. He has not had normalization of his hyperbilirubinemia. He presents today for a through the tube cholangiogram and a biopsy EXAM: FLUOROSCOPIC GUIDED FINE NEEDLE ASPIRATION OF LIVER TUMOR/BILIARY TUMOR THROUGH THE TUBE CHOLANGIOGRAM EXCHANGE OF INDWELLING LEFT-SIDED BILIARY INTERNAL EXTERNAL DRAIN FLUOROSCOPY TIME:  7 minutes 30 seconds MEDICATIONS AND MEDICAL HISTORY: None ANESTHESIA/SEDATION: 1.5 mg Versed, 75 mcg fentanyl 55 minutes CONTRAST:  71mL OMNIPAQUE IOHEXOL 300 MG/ML  SOLN COMPLICATIONS: None PROCEDURE: Informed consent was obtained from the patient following explanation of the procedure, risks, benefits and alternatives. The patient understands, agrees and consents for the procedure. All questions were addressed. A time out was performed. Maximal barrier sterile technique utilized including caps, mask, sterile gowns, sterile gloves, large sterile drape, hand hygiene, and 1% lidocaine was used for local anesthesia. The patient is positioned supine position on the IR table and the upper abdomen was prepped and draped in the usual sterile fashion. Scout images of the indwelling biliary tube were acquired and were used as a target for fine-needle aspiration of the soft  tissues in the adjacent liver and biliary system. Three separate 21 gauge fine-needle Chiba were used for  fine-needle aspirations under fluoroscopic guidance. The samples were passed to a cytotechnologist in the room. Initial quick stain was negative, and 3 separate fine-needle aspirations were acquired under fluoroscopy. A Bentson wire was advanced through the indwelling tube into the duodenum, and the tube was removed. An 8 French sheath was advanced over the wire into the biliary system. The dilator was removed and a through the tube cholangiogram was performed with multiple obliquities. A new 10 French internal-external biliary tube was then placed. Final image was stored. Patient tolerated the procedure well and remained hemodynamically stable throughout. No complications were encountered and no significant blood loss was encountered. FINDINGS: Through the tube cholangiogram demonstrates opacification of the majority of the right-sided ductal system, which is in communication with the left-sided ductal system. There is distortion of the ducts at the confluence of the left and the right sided ducts, with a few ducts of segment 6 not opacified, potentially obstructed by the tumor. Significant common bile duct and common hepatic duct dilation, with a tubular filling defect nearly entirely filling the common hepatic duct and common bile duct to the level of the ampulla. Final image demonstrates placement of a new 35 [place] internal external biliary tube terminating in the duodenum, with the most proximal side hole within the left-sided ductal system. 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 Tissue specimen sent to pathology for complete histopathologic analysis. Status post through the tube cholangiogram with confirmation of communication of the majority of the right-sided hepatic ducts with the left-sided ducts which remain drained.  Status post exchange of internal/ external biliary drain with placement of a new 10 French drain terminating in the duodenum. Signed, Dulcy Fanny. Earleen Newport, DO [facility] Electronically Signed   By: Corrie Mckusick D.O.   On: 09/20/2015 14:21    Microbiology: Recent Results (from the past 240 hour(s))  Culture, body fluid-bottle     Status: None   Collection Time: 10/12/15  3:55 PM  Result Value Ref Range Status   Specimen Description PERITONEAL  Final   Special Requests BOTTLES DRAWN AEROBIC AND ANAEROBIC 47ml  Final   Gram Stain   Final    GRAM POSITIVE COCCI IN CLUSTERS IN BOTH AEROBIC AND ANAEROBIC BOTTLES CRITICAL RESULT CALLED TO, READ BACK BY AND VERIFIED WITH: TO JCLARK(RN) 10/13/15 BY TCLEVELAND 10/13/15 3:30AM    Culture   Final    METHICILLIN RESISTANT STAPHYLOCOCCUS AUREUS Performed at [facility]    Report Status 10/27/2015 FINAL  Final   Organism ID, Bacteria METHICILLIN RESISTANT STAPHYLOCOCCUS AUREUS  Final      Susceptibility   Methicillin resistant staphylococcus aureus - MIC*    CIPROFLOXACIN >=8 RESISTANT Resistant     ERYTHROMYCIN >=8 RESISTANT Resistant     GENTAMICIN <=0.5 SENSITIVE Sensitive     OXACILLIN >=4 RESISTANT Resistant     TETRACYCLINE <=1 SENSITIVE Sensitive     VANCOMYCIN <=0.5 SENSITIVE Sensitive     TRIMETH/SULFA <=10 SENSITIVE Sensitive     CLINDAMYCIN <=0.25 SENSITIVE Sensitive     RIFAMPIN <=0.5 SENSITIVE Sensitive     Inducible Clindamycin NEGATIVE Sensitive     * METHICILLIN RESISTANT STAPHYLOCOCCUS AUREUS  Gram stain     Status: None   Collection Time: 10/12/15  3:55 PM  Result Value Ref Range Status   Specimen Description PERITONEAL  Final   Special Requests NONE  Final   Gram Stain   Final  WBC PRESENT,BOTH PMN AND MONONUCLEAR GRAM POSITIVE COCCI IN CLUSTERS CRITICAL RESULT CALLED TO, READ BACK BY AND VERIFIED WITH: R.BALDWIN,RN 10/12/15 @1744  BY V.WILKINS CONFIRMED BY  K.WOOTEN Performed at [facility]    Report Status 10/16/2015 FINAL  Final  Culture, blood (routine x 2)     Status: None   Collection Time: 10/12/15  6:50 PM  Result Value Ref Range Status   Specimen Description BLOOD BLOOD RIGHT ARM  Final   Special Requests BOTTLES DRAWN AEROBIC ONLY 10CC  Final   Culture   Final    NO GROWTH 5 DAYS Performed at [facility]    Report Status 2015-11-03 FINAL  Final  MRSA PCR Screening     Status: Abnormal   Collection Time: 10/12/15  6:53 PM  Result Value Ref Range Status   MRSA by PCR POSITIVE (A) NEGATIVE Final    Comment:        The GeneXpert MRSA Assay (FDA approved for NASAL specimens only), is one component of a comprehensive MRSA colonization surveillance program. It is not intended to diagnose MRSA infection nor to guide or monitor treatment for MRSA infections. RESULT CALLED TO, READ BACK BY AND VERIFIED WITH: A.SAISON,RN AT 2153 ON 10/12/15 BY W.SHEA   Culture, blood (routine x 2)     Status: None   Collection Time: 10/12/15  6:58 PM  Result Value Ref Range Status   Specimen Description BLOOD BLOOD LEFT ARM  Final   Special Requests BOTTLES DRAWN AEROBIC ONLY 10CC  Final   Culture   Final    NO GROWTH 5 DAYS Performed at [facility]    Report Status 11/03/15 FINAL  Final  C difficile quick scan w PCR reflex     Status: None   Collection Time: 10/13/15  3:30 PM  Result Value Ref Range Status   C Diff antigen NEGATIVE NEGATIVE Final   C Diff toxin NEGATIVE NEGATIVE Final   C Diff interpretation Negative for toxigenic C. difficile  Final  Culture, body fluid-bottle     Status: None (Preliminary result)   Collection Time: 10/18/2015 12:02 PM  Result Value Ref Range Status   Specimen Description FLUID BILE  Final   Special Requests BOTTLES DRAWN AEROBIC AND ANAEROBIC 10CC  Final   Gram Stain   Final    GRAM POSITIVE COCCI IN CLUSTERS IN BOTH AEROBIC AND ANAEROBIC BOTTLES CRITICAL RESULT CALLED TO,  READ BACK BY AND VERIFIED WITH: C AUMAN @0146  10/15/14 MKELLY    Culture   Final    STAPHYLOCOCCUS AUREUS SUSCEPTIBILITIES PERFORMED ON PREVIOUS CULTURE WITHIN THE LAST 5 DAYS. Performed at [facility]    Report Status PENDING  Incomplete  Gram stain     Status: None   Collection Time: 10/20/2015 12:02 PM  Result Value Ref Range Status   Specimen Description FLUID BILE  Final   Special Requests NONE  Final   Gram Stain   Final    RARE WBC PRESENT, PREDOMINANTLY PMN RARE GRAM VARIABLE ROD Performed at [facility]    Report Status 10/16/2015 FINAL  Final     Labs: Basic Metabolic Panel:  Recent Labs Lab 10/13/15 0545 10/13/15 1720 10/14/15 0401 10/14/15 1820 10/29/2015 0530 10/16/15 0310  NA 127* 128* 133* 133* 132* 139  K 4.4 4.1 4.2 4.4 4.2 4.8  CL 96* 99* 104 103 102 112*  CO2 22 22 20* 19* 18* 17*  GLUCOSE 117* 113* 104* 84 154* 105*  BUN 56* 62*  61* 77* 79* 99*  CREATININE 1.95* 2.27* 2.35* 2.55* 2.66* 2.84*  CALCIUM 9.1 8.7* 9.7 10.1 9.8 9.9  MG 1.9  --   --   --   --   --    Liver Function Tests:  Recent Labs Lab 10/13/15 0545 10/13/15 1720 10/14/15 0401 10/19/2015 0530 10/16/15 0310  AST 33 37 28 28 30   ALT 24 24 17  16* 16*  ALKPHOS 94 90 61 58 45  BILITOT 1.9* 2.0* 1.7* 2.2* 2.9*  PROT 5.7* 5.5* 6.0* 5.4* 5.4*  ALBUMIN 1.5* 1.5* 3.2* 2.4* 2.8*   No results for input(s): LIPASE, AMYLASE in the last 168 hours.  Recent Labs Lab 10/16/15 0310  AMMONIA 106*   CBC:  Recent Labs Lab 10/03/2015 1325  10/13/15 1720 10/14/15 0401 10/14/15 0428 11/06/2015 0530 10/16/15 0310 10-30-2015 0450  WBC 10.2  < > 11.9* 8.2  --  12.8* 13.7* 15.4*  NEUTROABS 8.5*  --   --  5.7  --   --   --   --   HGB 13.4  < > 11.4* 9.3*  --  10.4* 7.5* 7.5*  HCT 40.2  < > 32.0* 26.8* 25.5* 29.6* 20.9* 22.0*  MCV 107.5*  < > 100.3* 100.8*  --  102.4* 100.0 100.9*  PLT 101*  < > 82* 70*  --  107* 85* 104*  < > = values in this interval not displayed. Cardiac  Enzymes: No results for input(s): CKTOTAL, CKMB, CKMBINDEX, TROPONINI in the last 168 hours. BNP: BNP (last 3 results) No results for input(s): BNP in the last 8760 hours.  ProBNP (last 3 results) No results for input(s): PROBNP in the last 8760 hours.  CBG: No results for input(s): GLUCAP in the last 168 hours.     Signed:  Louellen Molder MD  FACP  [facility] Oct 30, 2015, 4:09 PM

## 2015-11-13 NOTE — Care Management Note (Addendum)
Case Management Note  Patient Details  Name: Eric Schroeder MRN: [id number] Date of Birth: 1956/02/05  Subjective/Objective:  60 y/o male with cholangiocarcinoma hospitalized 10/10/16 with encephalopathy and AKI due to staph peritonitis. Received to 1617 on 10/16/2015 as a transfer from the ICU. Currently on Morphine Drip @ 10mg /Hr, Sandostatin drip and IV Vancomycin. Has PIV, Foley Cath, and Biliary Drain.                     Action/Plan: CM will continue to follow for discharge/disposition needs.    Expected Discharge Date:                  Expected Discharge Plan:     In-House Referral:     Discharge planning Services     Post Acute Care Choice:    Choice offered to:     DME Arranged:    DME Agency:     HH Arranged:    [place] Agency:     Status of Service:     Medicare Important Message Given:    Date Medicare IM Given:    Medicare IM give by:    Date Additional Medicare IM Given:    Additional Medicare Important Message give by:     If discussed at [place] of Stay Meetings, dates discussed:    Additional Comments:  Delrae Sawyers, RN 11-03-15, 10:15 AM

## 2015-11-13 DEATH — deceased

## 2015-11-14 ENCOUNTER — Telehealth: Payer: Self-pay | Admitting: *Deleted

## 2015-11-14 NOTE — Telephone Encounter (Signed)
FYI I'm calling on behalf of my daughter, Eric Schroeder mother Eric Schroeder.  There is a form she has for [employer] we need completed.  Form asks for date he was diagnosed and other questions we need Dr, Burr Medico  To complete.  He was seen in the office, admitted twice.  Died 11/13/15.  What do we need to do?  Call Pat at [phone]."  Instructed to bring insurance form to [facility] registration desk.  7:00 am until 4:00 pm.  Forms require 7 to 10 bussiness days for completion.  Asked they leave instructions for mailing, faxing or pick up of form for Managed Care.     Will bring form early morning.  Asked for a call [phone]) to confirm receipt of form.

## 2015-11-18 ENCOUNTER — Encounter: Payer: Self-pay | Admitting: Hematology

## 2015-11-18 NOTE — Progress Notes (Signed)
girlfriend dropped off forms. I advised her 3-5 days and will call once completed

## 2015-11-19 ENCOUNTER — Encounter: Payer: Self-pay | Admitting: Hematology

## 2015-11-19 NOTE — Progress Notes (Signed)
i placed forms for dr. Burr Medico to sign.. Insurance forms.

## 2015-11-20 NOTE — Telephone Encounter (Signed)
Form dropped off 11-18-2015.  Managed Care directed to provider 11-19-2015.

## 2015-12-03 ENCOUNTER — Encounter: Payer: Self-pay | Admitting: Hematology

## 2015-12-03 ENCOUNTER — Encounter: Payer: Self-pay | Admitting: Nurse Practitioner

## 2015-12-03 NOTE — Progress Notes (Signed)
Left for cbacon to sign since dr Burr Medico is out. Needs to be sent today since other forms were lost. See prev notes

## 2015-12-03 NOTE — Progress Notes (Signed)
girlfriend dropped off forms. again--other forms nurse/dr feng could not find. i placed for dr to sign- last dropped off 11/18/15

## 2015-12-27 ENCOUNTER — Other Ambulatory Visit: Payer: Self-pay | Admitting: Hematology

## 2016-01-10 ENCOUNTER — Encounter: Payer: Self-pay | Admitting: Hematology

## 2016-01-10 NOTE — Progress Notes (Signed)
I sent the fax sent on 12/03/15 to medical records

## 2016-07-31 IMAGING — US US RENAL
1 series · 14 of 25 positions shown · non-contrast
Comparison: None.

CLINICAL DATA: 59-year-old male with acute renal failure.

EXAM:
RENAL / URINARY TRACT ULTRASOUND COMPLETE

[Series 1: us renal · 0.21mm/px · 14 of 28 slices shown]
[im 1/28]
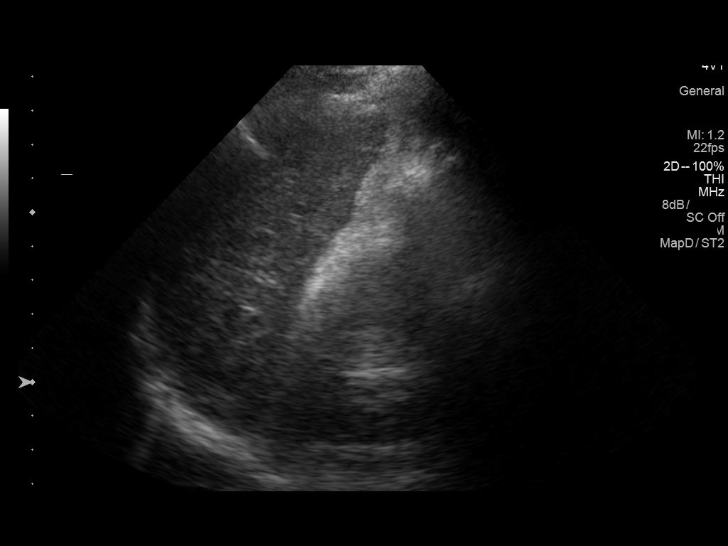
[im 3/28]
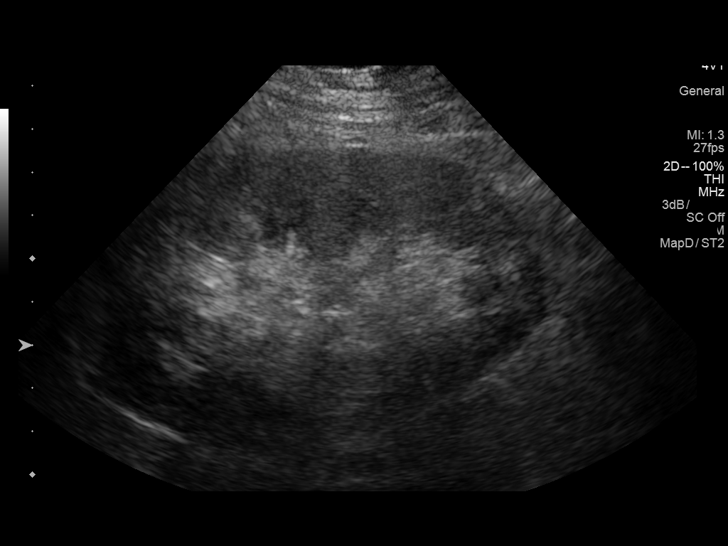
[im 5/28]
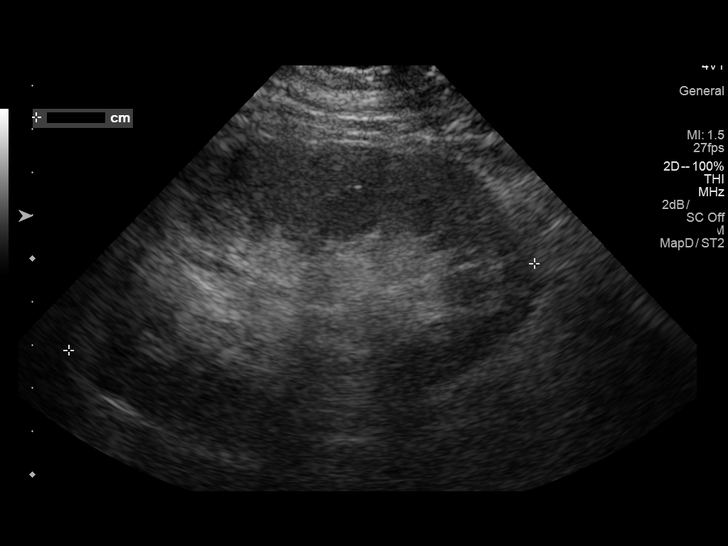
[im 7/28]
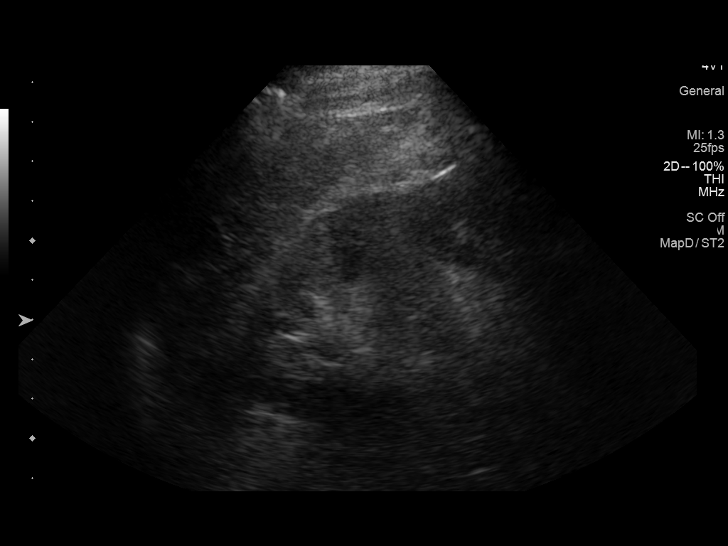
[im 10/28]
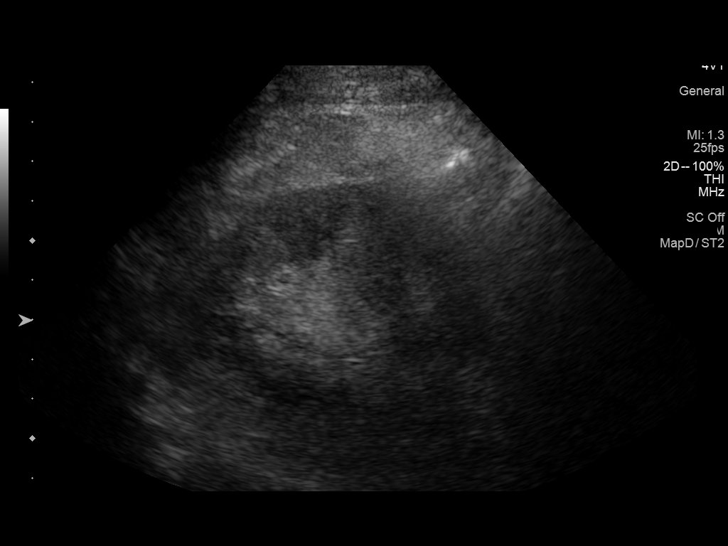
[im 11/28]
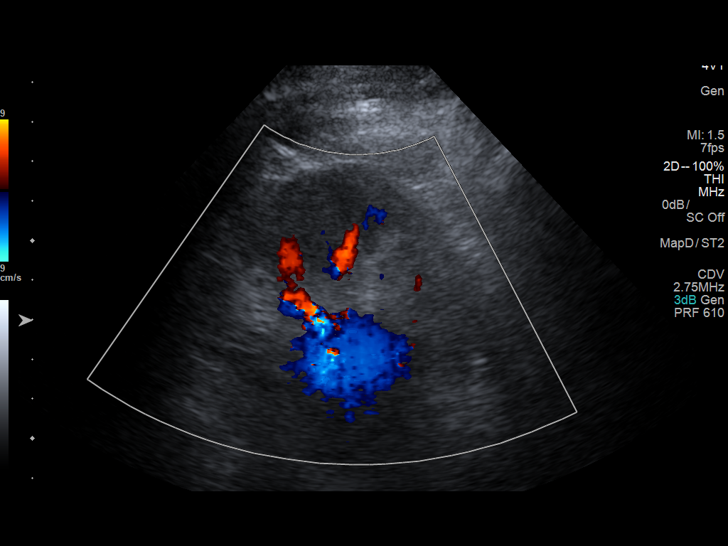
[im 13/28]
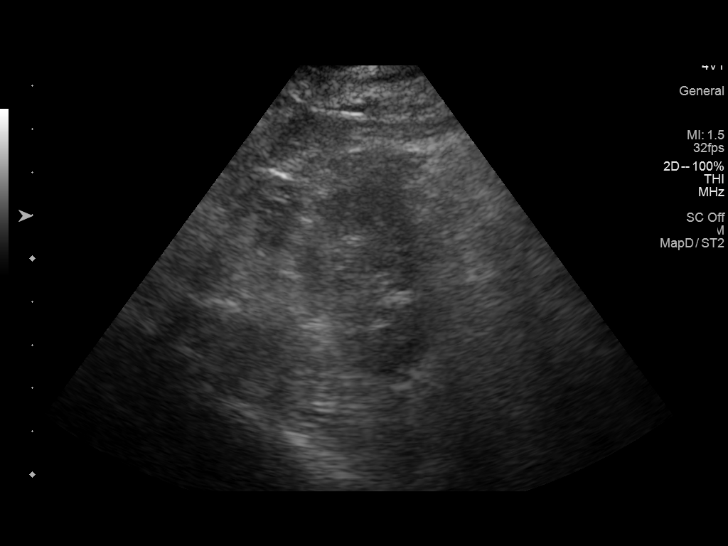
[im 15/28]
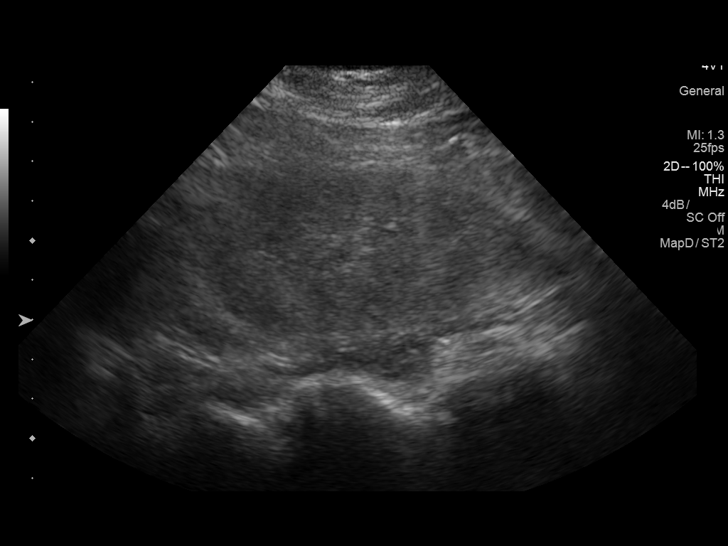
[im 17/28]
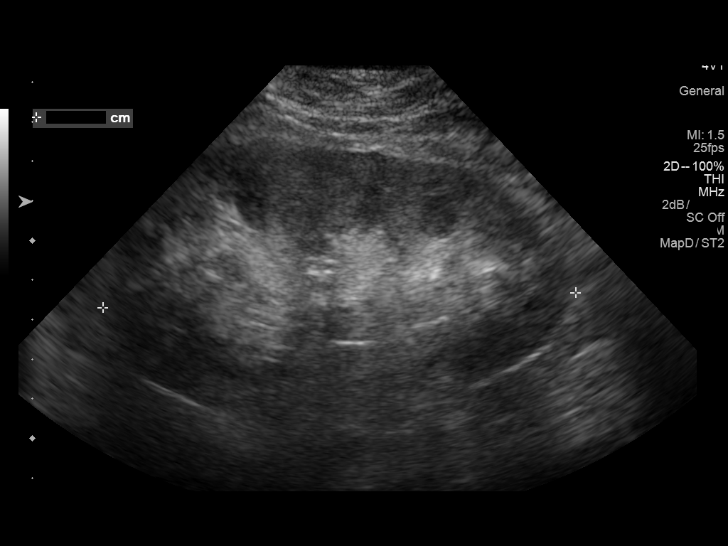
[im 19/28]
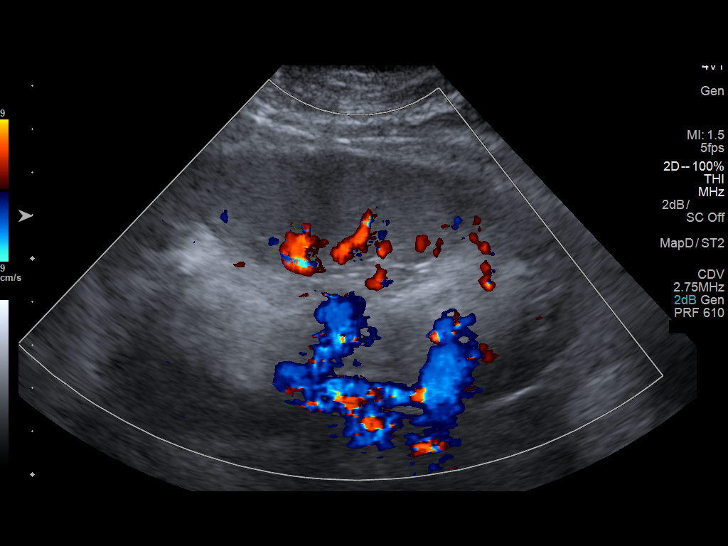
[im 21/28]
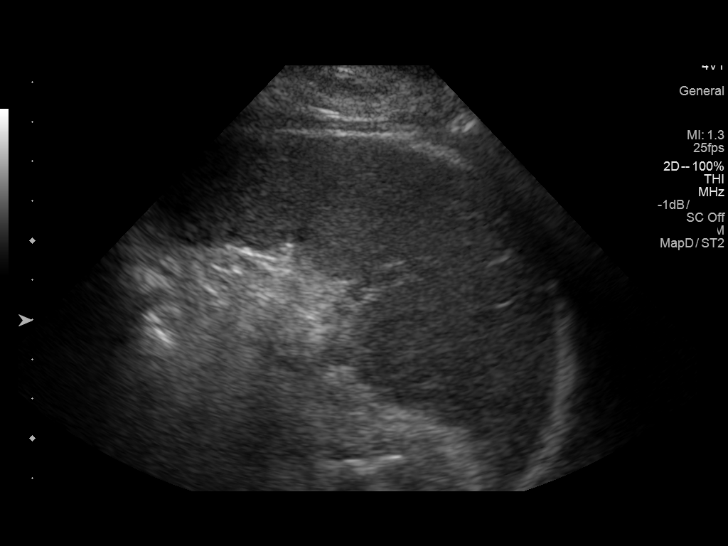
[im 23/28]
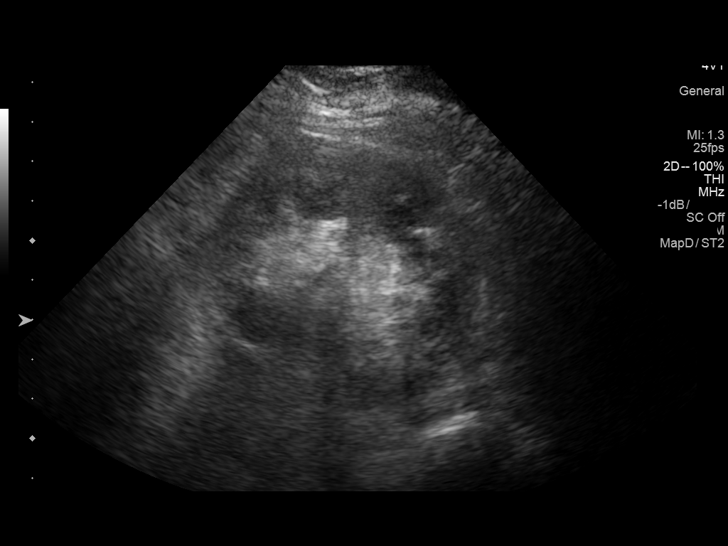
[im 25/28]
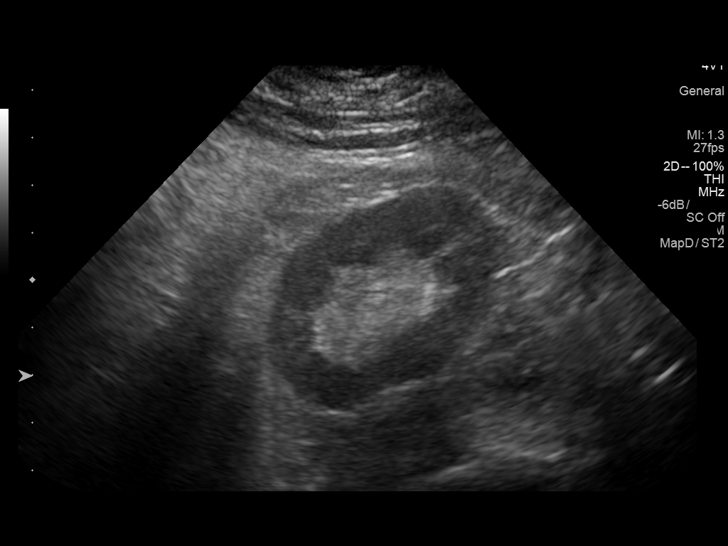
[im 28/28]
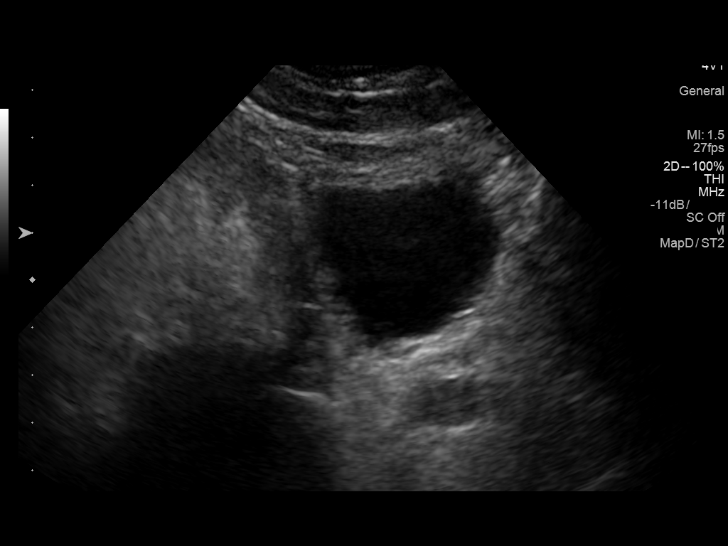

[14 of 25 positions shown; findings below may reference images not displayed]

FINDINGS: Right Kidney:

Length: 11.0 cm. Echogenicity within normal limits. No mass or
hydronephrosis visualized.

Left Kidney:

Length: 12.0 cm. Echogenicity within normal limits. No mass or
hydronephrosis visualized.

Bladder:

Appears normal for degree of bladder distention.
IMPRESSION: Unremarkable renal ultrasound.

## 2016-09-10 IMAGING — CR DG LUMBAR SPINE COMPLETE 4+V
5 series · 5 of 5 positions shown · non-contrast
Comparison: None.

CLINICAL DATA: Recent fall with low back pain, initial encounter

EXAM:
LUMBAR SPINE - COMPLETE 4+ VIEW

[t lumbar spine ap]
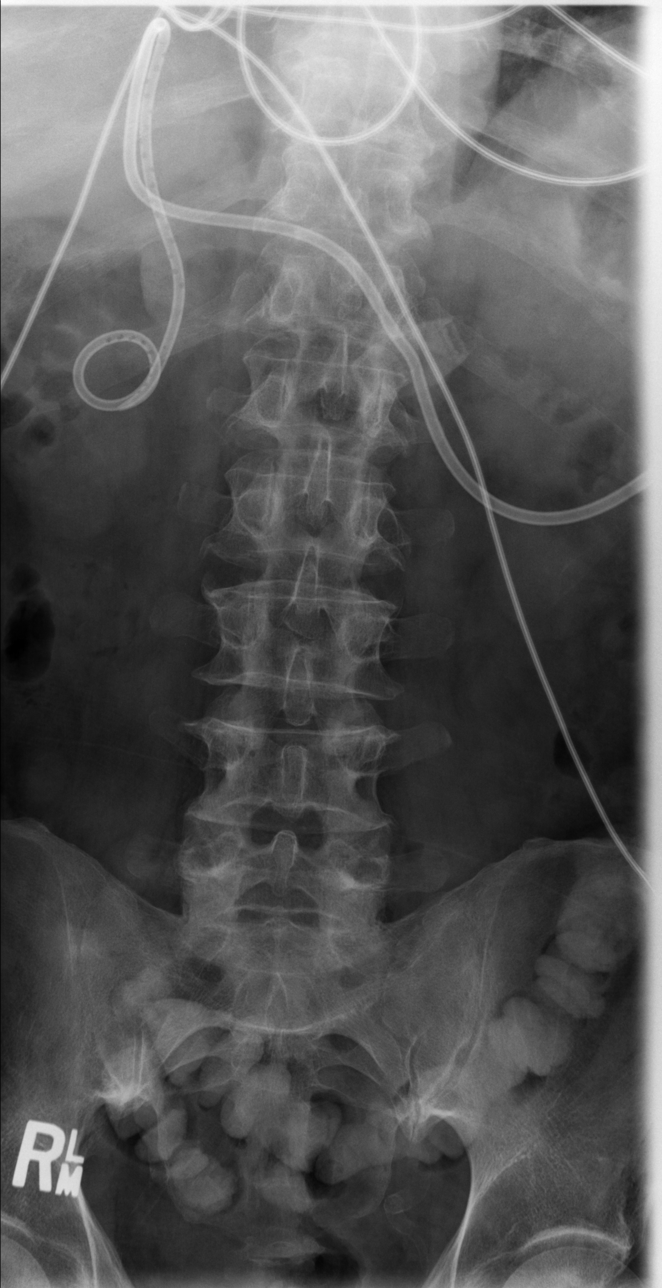

[t lumbar spine obl (1 of 2)]
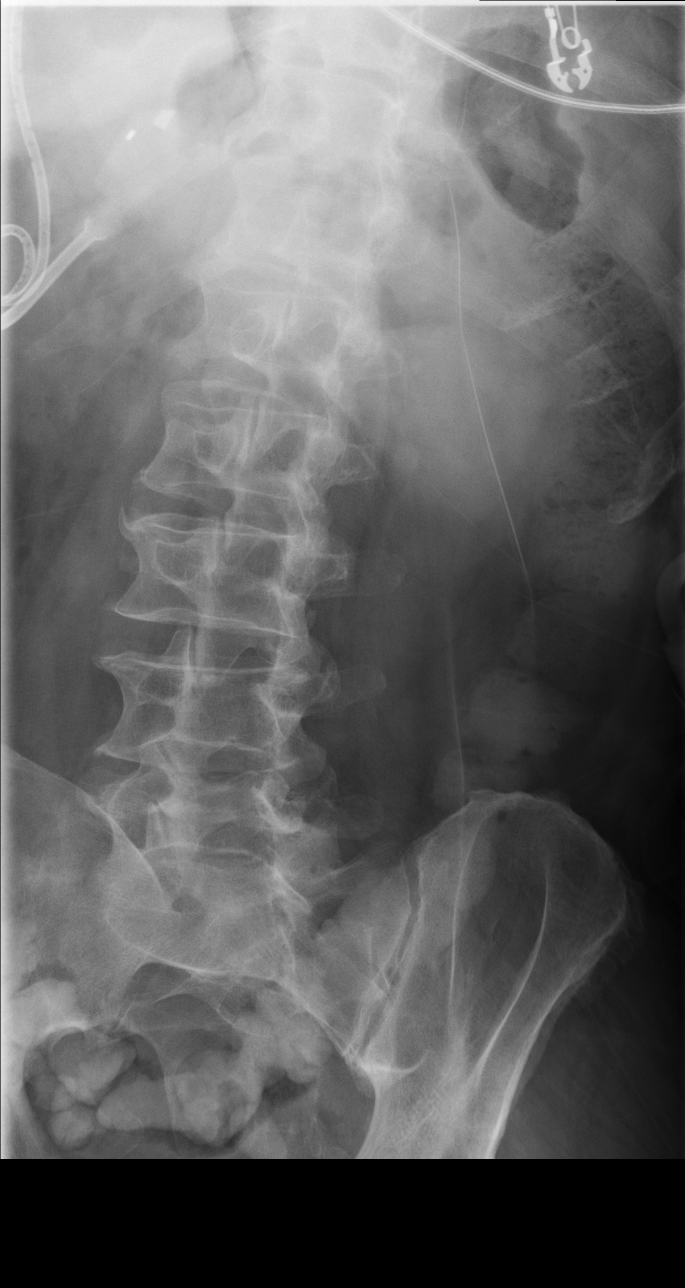

[t lumbar spine obl (2 of 2)]
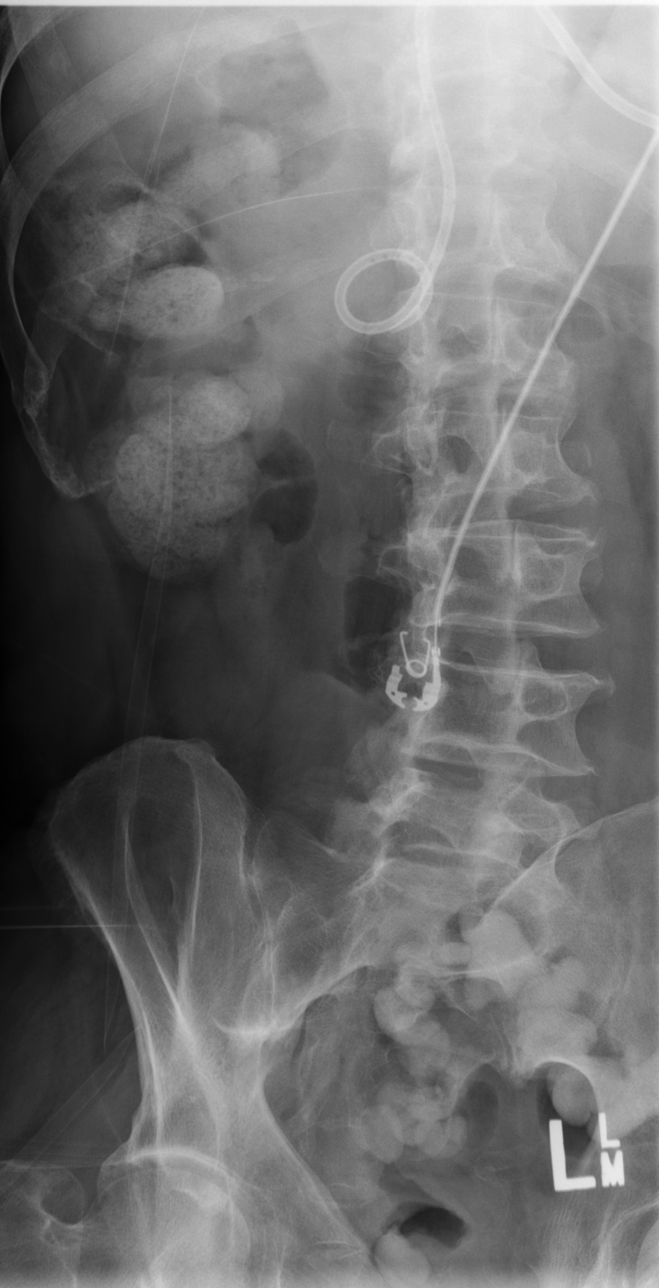

[t lumbar spine lat]
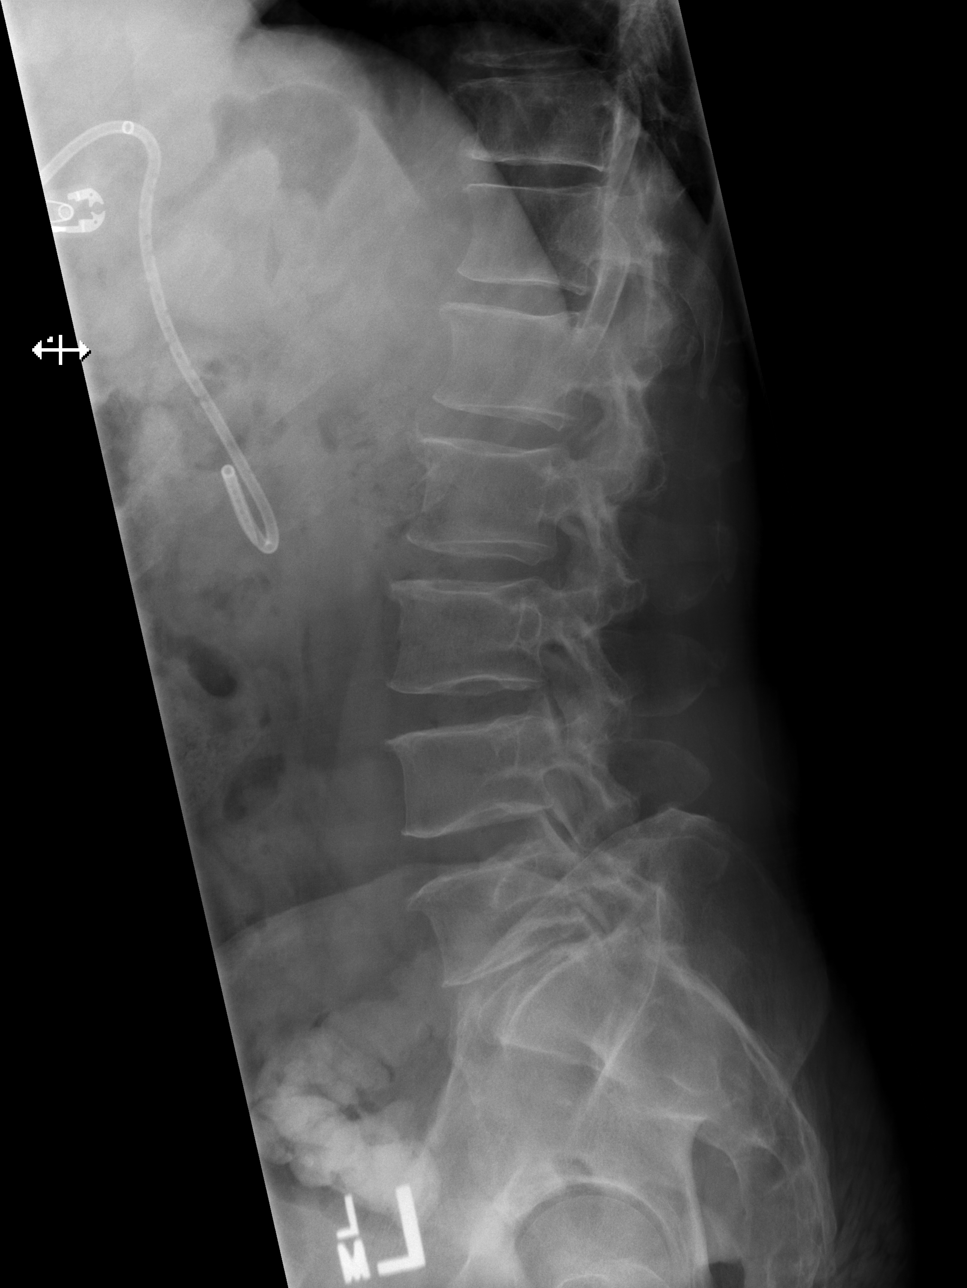

[t lumbar l-5 s-1 spot]
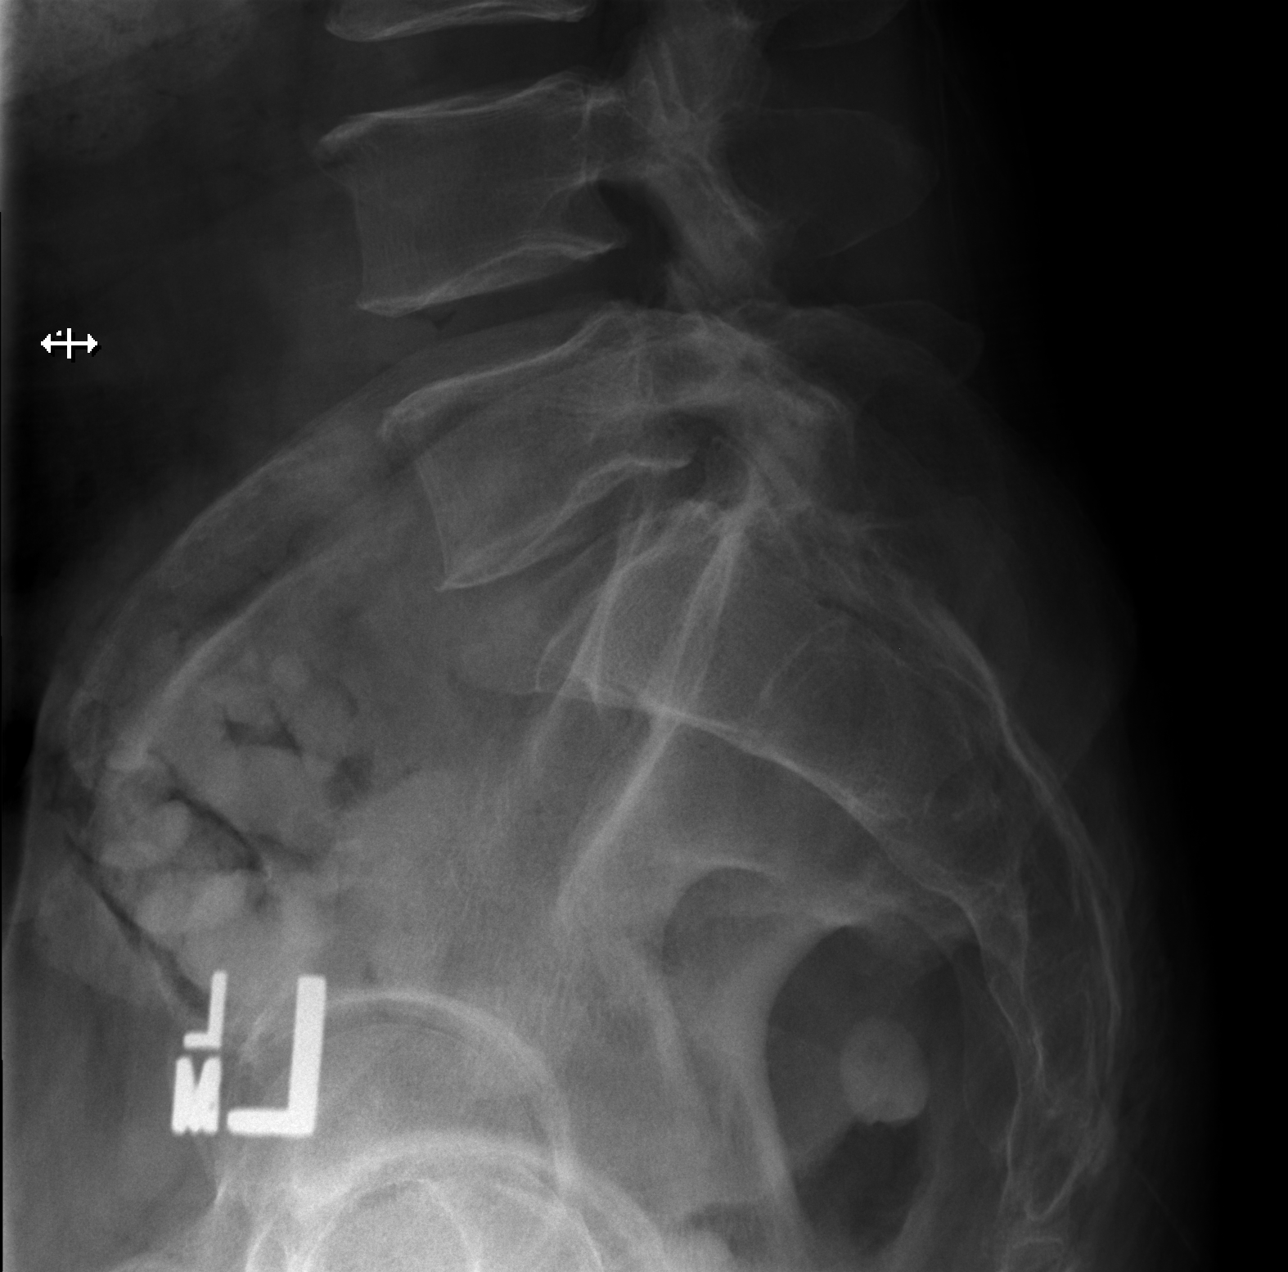

[5 of 5 positions shown; findings below may reference images not displayed]

FINDINGS: Biliary drainage catheter is noted. Five lumbar type vertebral
bodies are well visualized. Mild osteophytic changes are seen. No
spondylolisthesis is noted. No compression deformity is noted. No
gross soft tissue abnormality is seen.
IMPRESSION: Mild degenerative change without acute abnormality.
